# Patient Record
Sex: Female | Born: 1965
Health system: Southern US, Community
[De-identification: ages and names within clinical notes are randomized; demographics above are authoritative.]

## PROBLEM LIST (undated history)

## (undated) DIAGNOSIS — I10 Essential (primary) hypertension: Secondary | ICD-10-CM

## (undated) DIAGNOSIS — N189 Chronic kidney disease, unspecified: Secondary | ICD-10-CM

## (undated) DIAGNOSIS — F32A Depression, unspecified: Secondary | ICD-10-CM

## (undated) DIAGNOSIS — F329 Major depressive disorder, single episode, unspecified: Secondary | ICD-10-CM

## (undated) DIAGNOSIS — E119 Type 2 diabetes mellitus without complications: Secondary | ICD-10-CM

## (undated) HISTORY — DX: Depression, unspecified: F32.A

## (undated) HISTORY — DX: Essential (primary) hypertension: I10

## (undated) HISTORY — PX: ABDOMINAL HYSTERECTOMY: SHX81

## (undated) HISTORY — DX: Type 2 diabetes mellitus without complications: E11.9

## (undated) HISTORY — DX: Chronic kidney disease, unspecified: N18.9

## (undated) HISTORY — PX: COLONOSCOPY: SHX174

---
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
## 2013-06-13 LAB — HEPATIC FUNCTION PANEL
ALT: 19 U/L (ref 7–35)
AST: 19 U/L (ref 13–35)

## 2013-06-13 LAB — CMP14+LP+1AC+CBC/D/PLT+TSH: Calcium: 10.1 mg/dL

## 2013-06-13 LAB — BASIC METABOLIC PANEL
Creatinine: 0.9 mg/dL (ref 0.5–1.1)
Glucose: 99 mg/dL
POTASSIUM: 4 mmol/L (ref 3.4–5.3)
SODIUM: 140 mmol/L (ref 137–147)

## 2013-06-13 LAB — LIPID PANEL
Cholesterol: 154 mg/dL (ref 0–200)
TRIGLYCERIDES: 90 mg/dL (ref 40–160)

## 2013-06-13 LAB — HEMOGLOBIN A1C: Hgb A1c MFr Bld: 6.9 % — AB (ref 4.0–6.0)

## 2013-06-13 LAB — CBC AND DIFFERENTIAL
HEMATOCRIT: 42 % (ref 36–46)
HEMOGLOBIN: 14.3 g/dL (ref 12.0–16.0)
Platelets: 363 10*3/uL (ref 150–399)
WBC: 13.9 10^3/mL

## 2014-05-31 ENCOUNTER — Ambulatory Visit (INDEPENDENT_AMBULATORY_CARE_PROVIDER_SITE_OTHER): Payer: PRIVATE HEALTH INSURANCE | Admitting: Physician Assistant

## 2014-05-31 ENCOUNTER — Encounter: Payer: Self-pay | Admitting: Physician Assistant

## 2014-05-31 VITALS — BP 124/70 | HR 83 | Ht 66.75 in | Wt 205.0 lb

## 2014-05-31 DIAGNOSIS — Z1211 Encounter for screening for malignant neoplasm of colon: Secondary | ICD-10-CM

## 2014-05-31 DIAGNOSIS — E669 Obesity, unspecified: Secondary | ICD-10-CM | POA: Insufficient documentation

## 2014-05-31 DIAGNOSIS — I10 Essential (primary) hypertension: Secondary | ICD-10-CM

## 2014-05-31 DIAGNOSIS — F17200 Nicotine dependence, unspecified, uncomplicated: Secondary | ICD-10-CM | POA: Insufficient documentation

## 2014-05-31 DIAGNOSIS — E119 Type 2 diabetes mellitus without complications: Secondary | ICD-10-CM

## 2014-05-31 DIAGNOSIS — N951 Menopausal and female climacteric states: Secondary | ICD-10-CM

## 2014-05-31 DIAGNOSIS — R635 Abnormal weight gain: Secondary | ICD-10-CM | POA: Insufficient documentation

## 2014-05-31 MED ORDER — LISINOPRIL 40 MG PO TABS: 40.0000 mg | ORAL_TABLET | Freq: Every day | ORAL | Status: DC

## 2014-05-31 MED ORDER — AMBULATORY NON FORMULARY MEDICATION: Status: DC

## 2014-05-31 MED ORDER — PHENTERMINE HCL 37.5 MG PO TABS: 37.5000 mg | ORAL_TABLET | Freq: Every day | ORAL | Status: DC

## 2014-05-31 MED ORDER — BUPROPION HCL ER (SR) 150 MG PO TB12: 150.0000 mg | ORAL_TABLET | Freq: Two times a day (BID) | ORAL | Status: DC

## 2014-06-03 ENCOUNTER — Encounter: Payer: Self-pay | Admitting: Physician Assistant

## 2014-06-03 NOTE — Progress Notes (Signed)
Subjective:    Patient ID: Nina Trujillo, female    DOB: 1966-05-07, 49 y.o.   MRN: [id number]  HPI Patient is a 49 year old female who presents to the clinic to establish care today.  .. Active Ambulatory Problems    Diagnosis Date Noted  . Tobacco dependence 05/31/2014  . Abnormal weight gain 05/31/2014  . Obese 05/31/2014  . Essential hypertension, benign 05/31/2014  . Diabetes mellitus type 2, controlled, without complications 40/97/3532  . Menopausal symptoms 05/31/2014   Resolved Ambulatory Problems    Diagnosis Date Noted  . No Resolved Ambulatory Problems   Past Medical History  Diagnosis Date  . Hypertension   . Diabetes mellitus without complication    .[name] Family History  Problem Relation Age of Onset  . Cancer Mother     cervical, breast  . Multiple sclerosis Mother   . Multiple sclerosis Father   . Multiple sclerosis Sister   . Diabetes Sister   . Heart attack Brother   . Cancer Maternal Aunt     lung  . Diabetes Maternal Aunt   . Hypertension Maternal Aunt   . Stroke Maternal Grandmother   . Diabetes Maternal Aunt   . Hypertension Maternal Aunt    .[name] History   Social History  . Marital Status: Single    Spouse Name: N/A    Number of Children: N/A  . Years of Education: N/A   Occupational History  . Not on file.   Social History Main Topics  . Smoking status: Current Some Day Smoker  . Smokeless tobacco: Not on file  . Alcohol Use: No  . Drug Use: No  . Sexual Activity: Yes   Other Topics Concern  . Not on file   Social History Narrative   She would like refills on her medications today. She does have several ongoing conditions that she needs monitored.     Review of Systems  All other systems reviewed and are negative.      Objective:   Physical Exam  Constitutional: She is oriented to person, place, and time. She appears well-developed and well-nourished.  HENT:  Head: Normocephalic and atraumatic.  Cardiovascular:  Normal rate, regular rhythm and normal heart sounds.   Pulmonary/Chest: Effort normal and breath sounds normal. She has no wheezes.  Neurological: She is alert and oriented to person, place, and time.  Skin: Skin is dry.  Psychiatric: She has a normal mood and affect. Her behavior is normal.          Assessment & Plan:  Diabetes mellitus-do not have time to follow-up on diabetes and get A1c. Patient is to follow-up in 4 weeks. I did go ahead and refill metformin today.  Hypertension-blood pressure controlled today. Refilled HCTZ for 6 months.  Tobacco dependence-discuss quitting smoking today. She reports she is cutting back but not ready to completely quit at this point.  Obese-discussed importance of weight loss. The last 3 months she has lost around 20 pounds with walking and diet changes. She would like to continue this. Discussed medication to help. She is willing to discuss this at a later date.  Menopausal symptoms-she's been on estradiol since her hysterectomy in 98. Discussed possibility of going off this medication she is not ready at this time. Will discuss at future visit. Patient aware that goal therapy is to be on for 5 years and then coming off of it.  Will schedule patient for colonoscopy. Patient is aware she needs a complete physical at some point  to go over other health maintanence issues. Flu shot declined today. Tetanus shot up-to-date.

## 2014-06-27 ENCOUNTER — Other Ambulatory Visit: Payer: Self-pay | Admitting: *Deleted

## 2014-06-27 MED ORDER — LISINOPRIL 40 MG PO TABS: 40.0000 mg | ORAL_TABLET | Freq: Every day | ORAL | Status: DC

## 2014-06-28 ENCOUNTER — Ambulatory Visit: Payer: PRIVATE HEALTH INSURANCE | Admitting: Physician Assistant

## 2014-07-11 ENCOUNTER — Other Ambulatory Visit: Payer: Self-pay | Admitting: Physician Assistant

## 2014-07-19 ENCOUNTER — Other Ambulatory Visit: Payer: Self-pay | Admitting: Physician Assistant

## 2014-07-23 ENCOUNTER — Other Ambulatory Visit: Payer: Self-pay | Admitting: Physician Assistant

## 2014-07-30 ENCOUNTER — Encounter: Payer: Self-pay | Admitting: Physician Assistant

## 2014-08-01 ENCOUNTER — Other Ambulatory Visit: Payer: Self-pay | Admitting: Physician Assistant

## 2014-08-01 MED ORDER — METFORMIN HCL ER 750 MG PO TB24: 750.0000 mg | ORAL_TABLET | Freq: Every day | ORAL | Status: DC

## 2014-08-01 NOTE — Telephone Encounter (Signed)
Received refill request for Metformin 750mg  from [facility]. Per last note, Pt was to follow up in 4 weeks.  Contacted Pt, states she was having car trouble but has her car working now. Set up follow up appointment for 08/05/14 for further medication management and diabetes evaluation. Advised I would sent over one month supply of Metformin so she will not go without, but advised she needs to keep her upcoming appointment. Verbalized understanding. No further questions.

## 2014-08-05 ENCOUNTER — Encounter: Payer: Self-pay | Admitting: Physician Assistant

## 2014-08-05 ENCOUNTER — Ambulatory Visit (INDEPENDENT_AMBULATORY_CARE_PROVIDER_SITE_OTHER): Payer: PRIVATE HEALTH INSURANCE | Admitting: Physician Assistant

## 2014-08-05 VITALS — BP 136/83 | HR 96 | Wt 212.0 lb

## 2014-08-05 DIAGNOSIS — I10 Essential (primary) hypertension: Secondary | ICD-10-CM

## 2014-08-05 DIAGNOSIS — E119 Type 2 diabetes mellitus without complications: Secondary | ICD-10-CM | POA: Diagnosis not present

## 2014-08-05 DIAGNOSIS — E669 Obesity, unspecified: Secondary | ICD-10-CM

## 2014-08-05 DIAGNOSIS — M25512 Pain in left shoulder: Secondary | ICD-10-CM | POA: Diagnosis not present

## 2014-08-05 DIAGNOSIS — Z23 Encounter for immunization: Secondary | ICD-10-CM | POA: Diagnosis not present

## 2014-08-05 LAB — POCT GLYCOSYLATED HEMOGLOBIN (HGB A1C): HEMOGLOBIN A1C: 6

## 2014-08-05 LAB — POCT UA - MICROALBUMIN
Albumin/Creatinine Ratio, Urine, POC: <30
Creatinine, POC: 300 mg/dL
Microalbumin Ur, POC: 10 mg/L

## 2014-08-05 MED ORDER — MELOXICAM 15 MG PO TABS: 15.0000 mg | ORAL_TABLET | Freq: Every day | ORAL | Status: DC

## 2014-08-05 MED ORDER — BUPROPION HCL ER (SR) 150 MG PO TB12: 150.0000 mg | ORAL_TABLET | Freq: Two times a day (BID) | ORAL | Status: DC

## 2014-08-05 MED ORDER — HYDROCHLOROTHIAZIDE 25 MG PO TABS: 25.0000 mg | ORAL_TABLET | Freq: Every day | ORAL | Status: DC

## 2014-08-05 MED ORDER — METFORMIN HCL ER 750 MG PO TB24: 750.0000 mg | ORAL_TABLET | Freq: Every day | ORAL | Status: DC

## 2014-08-05 MED ORDER — PHENTERMINE HCL 37.5 MG PO TABS: 37.5000 mg | ORAL_TABLET | Freq: Every day | ORAL | Status: DC

## 2014-08-05 NOTE — Progress Notes (Signed)
   Subjective:    Patient ID: Nina Trujillo, female    DOB: 1965/07/12, 49 y.o.   MRN: [id number]  HPI Pt to the clinic for DM follow up. Not checking sugars but taking metformin daily. No hypoglycemic events. No concerns. Trying to maintain a good diet. Is going to start exercise.   Obesity- wants to restart phentermine. Started but did not give a good go due to life circumstances. Ready to restart.   HtN- no problems or concerns. No CP, palpitations, headaches, vision changes.   Pt mentions left shoulder pain at end of visit. Some days better than others. No known trauma or injury. Tried occasional ibuprofen as needed and helps some.    Review of Systems  All other systems reviewed and are negative.      Objective:   Physical Exam  Constitutional: She is oriented to person, place, and time. She appears well-developed and well-nourished.  HENT:  Head: Normocephalic and atraumatic.  Cardiovascular: Normal rate, regular rhythm and normal heart sounds.   Pulmonary/Chest: Effort normal and breath sounds normal.  Neurological: She is alert and oriented to person, place, and time.  Skin: Skin is dry.  Psychiatric: She has a normal mood and affect. Her behavior is normal.          Assessment & Plan:  DM, type II, controlled- .[name] Lab Results  Component Value Date   HGBA1C 6.0 08/05/2014   Microalbumin normal.  Continue metformin daily.  Foot exam normal.  Pneumococcal 23 done today.  Discussed need for eye exam.   Obesity- pt would like to start phentermine again. She tried back in january  did not give it a good go. Discussed could be coming less effective. Discussed other weight loss options. At this time would like to try phentermine again. Restarted. SE discussed. Follow up in 1 month.   HTN- refilled lisinopril and HCTZ. Borderline BP for diabetic. Will continue to monitor.   Left shoulder pain- pt aware no time to discuss today needs to make another appt. mobic given  to try for the next 2 weeks. Can evaluate if needs.

## 2014-08-24 ENCOUNTER — Other Ambulatory Visit: Payer: Self-pay | Admitting: Physician Assistant

## 2014-09-05 ENCOUNTER — Ambulatory Visit: Payer: PRIVATE HEALTH INSURANCE

## 2014-09-09 ENCOUNTER — Other Ambulatory Visit: Payer: Self-pay | Admitting: Physician Assistant

## 2014-12-02 ENCOUNTER — Ambulatory Visit: Payer: Self-pay | Admitting: Family Medicine

## 2014-12-13 ENCOUNTER — Ambulatory Visit (INDEPENDENT_AMBULATORY_CARE_PROVIDER_SITE_OTHER): Payer: PRIVATE HEALTH INSURANCE | Admitting: Family Medicine

## 2014-12-13 ENCOUNTER — Encounter: Payer: Self-pay | Admitting: Family Medicine

## 2014-12-13 ENCOUNTER — Ambulatory Visit: Payer: Self-pay | Admitting: Family Medicine

## 2014-12-13 VITALS — BP 138/82 | HR 96 | Ht 66.75 in | Wt 204.0 lb

## 2014-12-13 DIAGNOSIS — E669 Obesity, unspecified: Secondary | ICD-10-CM | POA: Diagnosis not present

## 2014-12-13 DIAGNOSIS — L732 Hidradenitis suppurativa: Secondary | ICD-10-CM | POA: Diagnosis not present

## 2014-12-13 DIAGNOSIS — R635 Abnormal weight gain: Secondary | ICD-10-CM | POA: Diagnosis not present

## 2014-12-13 DIAGNOSIS — E119 Type 2 diabetes mellitus without complications: Secondary | ICD-10-CM

## 2014-12-13 DIAGNOSIS — N183 Chronic kidney disease, stage 3 unspecified: Secondary | ICD-10-CM

## 2014-12-13 LAB — COMPLETE METABOLIC PANEL WITH GFR
ALK PHOS: 68 U/L (ref 33–130)
ALT: 13 U/L (ref 6–29)
AST: 18 U/L (ref 10–35)
Albumin: 4.1 g/dL (ref 3.6–5.1)
BUN: 12 mg/dL (ref 7–25)
CHLORIDE: 107 mmol/L (ref 98–110)
CO2: 26 mmol/L (ref 20–31)
CREATININE: 1.2 mg/dL — AB (ref 0.50–1.05)
Calcium: 9.7 mg/dL (ref 8.6–10.4)
GFR, Est African American: 61 mL/min (ref >=60)
GFR, Est Non African American: 53 mL/min — ABNORMAL LOW (ref >=60)
GLUCOSE: 103 mg/dL — AB (ref 65–99)
Potassium: 4 mmol/L (ref 3.5–5.3)
Sodium: 140 mmol/L (ref 135–146)
Total Bilirubin: 0.3 mg/dL (ref 0.2–1.2)
Total Protein: 7 g/dL (ref 6.1–8.1)

## 2014-12-13 LAB — POCT GLYCOSYLATED HEMOGLOBIN (HGB A1C): HEMOGLOBIN A1C: 5.6

## 2014-12-13 MED ORDER — PHENTERMINE HCL 37.5 MG PO TABS: 37.5000 mg | ORAL_TABLET | Freq: Every day | ORAL | Status: DC

## 2014-12-13 MED ORDER — DOXYCYCLINE HYCLATE 100 MG PO TABS: 100.0000 mg | ORAL_TABLET | Freq: Two times a day (BID) | ORAL | Status: DC

## 2014-12-13 NOTE — Assessment & Plan Note (Signed)
Well controlled diabetes. Continue metformin. Check metabolic panel to assess renal function

## 2014-12-13 NOTE — Assessment & Plan Note (Addendum)
With early abscess formation not yet drainable. Treat with doxycycline

## 2014-12-13 NOTE — Progress Notes (Signed)
Nina Trujillo is a 49 y.o. female who presents to [facility]  today for diabetes and hidradenitis check.  1) patient has well-controlled diabetes. She takes metformin daily. She denies any polyuria or polydipsia or hypoglycemic episodes. She feels well.  2) hidradenitis. Patient has frequent abscesses in both axilla and inguinal creases. She notes right axilla tenderness recently. In the past this is been well managed with oral antibiotics. She denies any drainage. No fevers or chills nausea vomiting or diarrhea.  3) stress: Patient recently lost her job. She is worried about what she will do.   Past Medical History  Diagnosis Date  . Hypertension   . Diabetes mellitus without complication    Past Surgical History  Procedure Laterality Date  . Abdominal hysterectomy      both uterus and ovaries removed.    History  Substance Use Topics  . Smoking status: Current Some Day Smoker  . Smokeless tobacco: Not on file  . Alcohol Use: No   ROS as above Medications: Current Outpatient Prescriptions  Medication Sig Dispense Refill  . AMBULATORY NON FORMULARY MEDICATION Glucometer device and lancets and test strips.  Dx. Diabetes 250.0 controlled   Test 1 to 3 times a day. 1 Device 0  . buPROPion (WELLBUTRIN SR) 150 MG 12 hr tablet Take 1 tablet (150 mg total) by mouth 2 (two) times daily. 60 tablet 5  . estradiol (ESTRACE) 2 MG tablet Take 2 mg by mouth daily.    . hydrochlorothiazide (HYDRODIURIL) 25 MG tablet Take 1 tablet (25 mg total) by mouth daily. 30 tablet 5  . lisinopril (PRINIVIL,ZESTRIL) 40 MG tablet Take 1 tablet (40 mg total) by mouth daily. 30 tablet 4  . meloxicam (MOBIC) 15 MG tablet TAKE ONE TABLET BY MOUTH ONCE DAILY FOR  2  WEEKS  THEN  AS  NEEDED  FOR  LEFT  SHOULDER  PAIN 30 tablet 2  . metFORMIN (GLUCOPHAGE-XR) 750 MG 24 hr tablet Take 1 tablet (750 mg total) by mouth daily with breakfast. 30 tablet 5  . doxycycline  (VIBRA-TABS) 100 MG tablet Take 1 tablet (100 mg total) by mouth 2 (two) times daily. 20 tablet 0  . phentermine (ADIPEX-P) 37.5 MG tablet Take 1 tablet (37.5 mg total) by mouth daily before breakfast. 30 tablet 0   No current facility-administered medications for this visit.   Allergies  Allergen Reactions  . Penicillins Anaphylaxis     Exam:  BP 138/82 mmHg  Pulse 96  Ht 5' 6.75" (1.695 m)  Wt 204 lb (92.534 kg)  BMI 32.21 kg/m2 Gen: Well NAD HEENT: EOMI,  MMM Lungs: Normal work of breathing. CTABL Heart: RRR no MRG Abd: NABS, Soft. Nondistended, Nontender Exts: Brisk capillary refill, warm and well perfused.  Skin: Mildly erythematous tender nodules bilateral axilla. No fluctuance or induration present.  Results for orders placed or performed in visit on 12/13/14 (from the past 24 hour(s))  POCT HgB A1C     Status: None   Collection Time: 12/13/14 11:29 AM  Result Value Ref Range   Hemoglobin A1C 5.6    No results found.   Please see individual assessment and plan sections.

## 2014-12-13 NOTE — Assessment & Plan Note (Signed)
Doing well refill phentermine

## 2014-12-13 NOTE — Patient Instructions (Signed)
Thank you for coming in today. Get labs today.,   Hidradenitis Suppurativa, Sweat Gland Abscess Hidradenitis suppurativa is a long lasting (chronic), uncommon disease of the sweat glands. With this, boil-like lumps and scarring develop in the groin, some times under the arms (axillae), and under the breasts. It may also uncommonly occur behind the ears, in the crease of the buttocks, and around the genitals.  CAUSES  The cause is from a blocking of the sweat glands. They then become infected. It may cause drainage and odor. It is not contagious. So it cannot be given to someone else. It most often shows up in puberty (about 36 to 51 years of age). But it may happen much later. It is similar to acne which is a disease of the sweat glands. This condition is slightly more common in African-Americans and women. SYMPTOMS   Hidradenitis usually starts as one or more red, tender, swellings in the groin or under the arms (axilla).  Over a period of hours to days the lesions get larger. They often open to the skin surface, draining clear to yellow-colored fluid.  The infected area heals with scarring. DIAGNOSIS  Your caregiver makes this diagnosis by looking at you. Sometimes cultures (growing germs on plates in the lab) may be taken. This is to see what germ (bacterium) is causing the infection.  TREATMENT   Topical germ killing medicine applied to the skin (antibiotics) are the treatment of choice. Antibiotics taken by mouth (systemic) are sometimes needed when the condition is getting worse or is severe.  Avoid tight-fitting clothing which traps moisture in.  Dirt does not cause hidradenitis and it is not caused by poor hygiene.  Involved areas should be cleaned daily using an antibacterial soap. Some patients find that the liquid form of Lever 2000, applied to the involved areas as a lotion after bathing, can help reduce the odor related to this condition.  Sometimes surgery is needed to drain  infected areas or remove scarred tissue. Removal of large amounts of tissue is used only in severe cases.  Birth control pills may be helpful.  Oral retinoids (vitamin A derivatives) for 6 to 12 months which are effective for acne may also help this condition.  Weight loss will improve but not cure hidradenitis. It is made worse by being overweight. But the condition is not caused by being overweight.  This condition is more common in people who have had acne.  It may become worse under stress. There is no medical cure for hidradenitis. It can be controlled, but not cured. The condition usually continues for years with periods of getting worse and getting better (remission). Document Released: 12/16/2003 Document Revised: 07/26/2011 Document Reviewed: 08/03/2013 [facility] Patient Information 2015 [place], [place]. This information is not intended to replace advice given to you by your health care provider. Make sure you discuss any questions you have with your health care provider.

## 2014-12-16 DIAGNOSIS — N183 Chronic kidney disease, stage 3 unspecified: Secondary | ICD-10-CM | POA: Insufficient documentation

## 2014-12-16 NOTE — Progress Notes (Signed)
Quick Note:  Labs show elevated creatine indicating mild kidney damage. No action needed at this time. Recheck in 6 months. ______

## 2015-01-01 ENCOUNTER — Other Ambulatory Visit: Payer: Self-pay | Admitting: Family Medicine

## 2015-01-11 ENCOUNTER — Other Ambulatory Visit: Payer: Self-pay | Admitting: Physician Assistant

## 2015-01-11 ENCOUNTER — Other Ambulatory Visit: Payer: Self-pay | Admitting: Family Medicine

## 2015-02-05 ENCOUNTER — Ambulatory Visit: Payer: PRIVATE HEALTH INSURANCE | Admitting: Physician Assistant

## 2015-02-07 ENCOUNTER — Other Ambulatory Visit: Payer: Self-pay | Admitting: Physician Assistant

## 2015-02-07 MED ORDER — ESTRADIOL 2 MG PO TABS
2.0000 mg | ORAL_TABLET | Freq: Every day | ORAL | Status: DC
Start: 2015-02-07 — End: 2015-03-17

## 2015-02-18 ENCOUNTER — Other Ambulatory Visit: Payer: Self-pay | Admitting: Physician Assistant

## 2015-02-22 ENCOUNTER — Other Ambulatory Visit: Payer: Self-pay | Admitting: Physician Assistant

## 2015-02-25 ENCOUNTER — Other Ambulatory Visit: Payer: Self-pay

## 2015-02-25 MED ORDER — BUPROPION HCL ER (SR) 150 MG PO TB12: 150.0000 mg | ORAL_TABLET | Freq: Two times a day (BID) | ORAL | Status: DC

## 2015-03-10 ENCOUNTER — Other Ambulatory Visit: Payer: Self-pay | Admitting: Physician Assistant

## 2015-03-13 ENCOUNTER — Other Ambulatory Visit: Payer: Self-pay

## 2015-03-17 ENCOUNTER — Other Ambulatory Visit: Payer: Self-pay | Admitting: Physician Assistant

## 2015-03-19 ENCOUNTER — Ambulatory Visit (INDEPENDENT_AMBULATORY_CARE_PROVIDER_SITE_OTHER): Payer: PRIVATE HEALTH INSURANCE | Admitting: Physician Assistant

## 2015-03-19 ENCOUNTER — Encounter: Payer: Self-pay | Admitting: Physician Assistant

## 2015-03-19 ENCOUNTER — Ambulatory Visit: Payer: PRIVATE HEALTH INSURANCE | Admitting: Physician Assistant

## 2015-03-19 VITALS — BP 130/53 | HR 56 | Ht 66.75 in | Wt 207.0 lb

## 2015-03-19 DIAGNOSIS — L732 Hidradenitis suppurativa: Secondary | ICD-10-CM | POA: Diagnosis not present

## 2015-03-19 DIAGNOSIS — N183 Chronic kidney disease, stage 3 unspecified: Secondary | ICD-10-CM

## 2015-03-19 DIAGNOSIS — I10 Essential (primary) hypertension: Secondary | ICD-10-CM

## 2015-03-19 DIAGNOSIS — F17200 Nicotine dependence, unspecified, uncomplicated: Secondary | ICD-10-CM

## 2015-03-19 DIAGNOSIS — F329 Major depressive disorder, single episode, unspecified: Secondary | ICD-10-CM

## 2015-03-19 DIAGNOSIS — E669 Obesity, unspecified: Secondary | ICD-10-CM

## 2015-03-19 DIAGNOSIS — Z13220 Encounter for screening for lipoid disorders: Secondary | ICD-10-CM

## 2015-03-19 DIAGNOSIS — E119 Type 2 diabetes mellitus without complications: Secondary | ICD-10-CM | POA: Diagnosis not present

## 2015-03-19 DIAGNOSIS — F32A Depression, unspecified: Secondary | ICD-10-CM

## 2015-03-19 DIAGNOSIS — Z23 Encounter for immunization: Secondary | ICD-10-CM | POA: Diagnosis not present

## 2015-03-19 LAB — POCT GLYCOSYLATED HEMOGLOBIN (HGB A1C): HEMOGLOBIN A1C: 6

## 2015-03-19 MED ORDER — ESTRADIOL 2 MG PO TABS: 2.0000 mg | ORAL_TABLET | Freq: Every day | ORAL | Status: DC

## 2015-03-19 MED ORDER — MELOXICAM 15 MG PO TABS: 15.0000 mg | ORAL_TABLET | Freq: Every day | ORAL | Status: DC

## 2015-03-19 MED ORDER — DOXYCYCLINE HYCLATE 100 MG PO TABS: 100.0000 mg | ORAL_TABLET | Freq: Two times a day (BID) | ORAL | Status: DC

## 2015-03-19 MED ORDER — LISINOPRIL 40 MG PO TABS: 40.0000 mg | ORAL_TABLET | Freq: Every day | ORAL | Status: DC

## 2015-03-19 MED ORDER — BENZOYL PEROXIDE 10 % EX GEL: Freq: Every day | CUTANEOUS | Status: DC

## 2015-03-19 MED ORDER — PHENTERMINE HCL 37.5 MG PO TABS
37.5000 mg | ORAL_TABLET | Freq: Every day | ORAL | Status: DC
Start: 2015-03-19 — End: 2015-08-25

## 2015-03-19 MED ORDER — METFORMIN HCL ER 750 MG PO TB24: 750.0000 mg | ORAL_TABLET | Freq: Every day | ORAL | Status: DC

## 2015-03-19 MED ORDER — BUPROPION HCL ER (SR) 150 MG PO TB12: 150.0000 mg | ORAL_TABLET | Freq: Two times a day (BID) | ORAL | Status: DC

## 2015-03-19 MED ORDER — HYDROCHLOROTHIAZIDE 25 MG PO TABS: 25.0000 mg | ORAL_TABLET | Freq: Every day | ORAL | Status: DC

## 2015-03-19 NOTE — Patient Instructions (Signed)

## 2015-03-21 DIAGNOSIS — F32A Depression, unspecified: Secondary | ICD-10-CM | POA: Insufficient documentation

## 2015-03-21 DIAGNOSIS — Z6834 Body mass index (BMI) 34.0-34.9, adult: Secondary | ICD-10-CM

## 2015-03-21 DIAGNOSIS — E6609 Other obesity due to excess calories: Secondary | ICD-10-CM | POA: Insufficient documentation

## 2015-03-21 DIAGNOSIS — F329 Major depressive disorder, single episode, unspecified: Secondary | ICD-10-CM | POA: Insufficient documentation

## 2015-03-21 NOTE — Progress Notes (Signed)
   Subjective:    Patient ID: Nina Trujillo, female    DOB: [birth year], 49 y.o.   MRN: [id number]  HPI Patient is a 49 year old female who presents to the clinic for 3 month follow-up.  Diabetes-patient takes metformin 6 are 750 mg daily. She denies any hypoglycemic events. She does not check her sugar regularly. She denies any open wounds or unhealed infections. She denies any vision changes or numbness and tingling of extremities. She admits she is not eating the way she should and she is not exercising. Patient does continue to smoke.  Hypertension-patient denies any chest pains, palpitations, shortness of breath, headaches or dizziness. She continues to take lisinopril and hydrochlorothiazide.  Patient continues to have tender THAT pop up in her armpits and bilateral groin area. At last visit she was given an antibiotic and it did help for a little bit. They've been "resurface. In the past she has seen dermatology but lost her insurance for a period of time.  She is really struggling with her weight. She was started on phentermine a few months back but never followed up. She would like to restart today.  Depression-she's on Wellbutrin daily. She denies any suicidal or homicidal thoughts. She feels like she is improving somewhat over the last 3 months since she got a job. It was a tough couple months when she lost her job with feeling down and hopeless and helpless.   Review of Systems  All other systems reviewed and are negative.      Objective:   Physical Exam  Constitutional: She is oriented to person, place, and time. She appears well-developed and well-nourished.  Obese.   HENT:  Head: Normocephalic and atraumatic.  Cardiovascular: Normal rate, regular rhythm and normal heart sounds.   Pulmonary/Chest: Effort normal and breath sounds normal. She has no wheezes.  Neurological: She is alert and oriented to person, place, and time.  Skin: Skin is dry.     Psychiatric: She has a  normal mood and affect. Her behavior is normal.          Assessment & Plan:  DM, type II, controlled- A1C 6.0 slight increase in last 3 months.  Continue on metformin.  Encouraged weight loss.  Flu shot given.  On ACE.  Encouraged eye exam.   HTN- controlled. Continue lisinopril and HcTZ.   Obesity- refilled phentermine. Discussed diet and exercise changes. Side effects discussed. Follow up in 1 month.   Screening lipid ordered.   CKD, stage III- recheck CMP today.   Hidradenitis supprativa- refilled doxy for 10 days. Given benzyol peroxide to use daily. Discussed twice weekly bleach bath soak. I would recommend going to derm for more intervention. She request to hold off at this time due to money.   Depression- refiled wellbutrin.   Tobacco dependence- encouraged smoking cessation today.

## 2015-03-31 ENCOUNTER — Ambulatory Visit: Payer: PRIVATE HEALTH INSURANCE

## 2015-06-25 ENCOUNTER — Other Ambulatory Visit: Payer: Self-pay | Admitting: Physician Assistant

## 2015-06-30 ENCOUNTER — Ambulatory Visit: Payer: PRIVATE HEALTH INSURANCE | Admitting: Physician Assistant

## 2015-07-14 ENCOUNTER — Other Ambulatory Visit: Payer: Self-pay | Admitting: Physician Assistant

## 2015-07-15 ENCOUNTER — Telehealth: Payer: Self-pay | Admitting: Physician Assistant

## 2015-07-15 NOTE — Telephone Encounter (Signed)
I called patient and left a message for her to call back and schedule a follow up with [name] so she can f/u on her Diabetes .[name] Thanks

## 2015-08-14 ENCOUNTER — Other Ambulatory Visit: Payer: Self-pay | Admitting: Physician Assistant

## 2015-08-25 ENCOUNTER — Ambulatory Visit: Payer: PRIVATE HEALTH INSURANCE | Admitting: Physician Assistant

## 2015-08-25 ENCOUNTER — Encounter: Payer: Self-pay | Admitting: Physician Assistant

## 2015-08-25 ENCOUNTER — Ambulatory Visit (INDEPENDENT_AMBULATORY_CARE_PROVIDER_SITE_OTHER): Payer: PRIVATE HEALTH INSURANCE | Admitting: Physician Assistant

## 2015-08-25 VITALS — BP 122/60 | HR 81 | Ht 66.75 in | Wt 203.0 lb

## 2015-08-25 DIAGNOSIS — E1129 Type 2 diabetes mellitus with other diabetic kidney complication: Secondary | ICD-10-CM | POA: Insufficient documentation

## 2015-08-25 DIAGNOSIS — Z131 Encounter for screening for diabetes mellitus: Secondary | ICD-10-CM

## 2015-08-25 DIAGNOSIS — N183 Chronic kidney disease, stage 3 unspecified: Secondary | ICD-10-CM

## 2015-08-25 DIAGNOSIS — L732 Hidradenitis suppurativa: Secondary | ICD-10-CM | POA: Diagnosis not present

## 2015-08-25 DIAGNOSIS — Z13220 Encounter for screening for lipoid disorders: Secondary | ICD-10-CM | POA: Diagnosis not present

## 2015-08-25 DIAGNOSIS — Z1159 Encounter for screening for other viral diseases: Secondary | ICD-10-CM

## 2015-08-25 DIAGNOSIS — R809 Proteinuria, unspecified: Secondary | ICD-10-CM

## 2015-08-25 DIAGNOSIS — E119 Type 2 diabetes mellitus without complications: Secondary | ICD-10-CM | POA: Diagnosis not present

## 2015-08-25 LAB — POCT UA - MICROALBUMIN
CREATININE, POC: 50 mg/dL
MICROALBUMIN (UR) POC: 30 mg/L

## 2015-08-25 LAB — POCT GLYCOSYLATED HEMOGLOBIN (HGB A1C): HEMOGLOBIN A1C: 5.7

## 2015-08-25 MED ORDER — DOXYCYCLINE HYCLATE 100 MG PO TABS: 100.0000 mg | ORAL_TABLET | Freq: Two times a day (BID) | ORAL | Status: DC

## 2015-08-25 MED ORDER — METFORMIN HCL ER 750 MG PO TB24: ORAL_TABLET | ORAL | Status: DC

## 2015-08-25 MED ORDER — ESTRADIOL 2 MG PO TABS: 2.0000 mg | ORAL_TABLET | Freq: Every day | ORAL | Status: DC

## 2015-08-25 MED ORDER — PHENTERMINE HCL 37.5 MG PO TABS: 37.5000 mg | ORAL_TABLET | Freq: Every day | ORAL | Status: DC

## 2015-08-25 MED ORDER — LISINOPRIL 40 MG PO TABS: 40.0000 mg | ORAL_TABLET | Freq: Every day | ORAL | Status: DC

## 2015-08-25 MED ORDER — BUPROPION HCL ER (SR) 150 MG PO TB12: 150.0000 mg | ORAL_TABLET | Freq: Two times a day (BID) | ORAL | Status: DC

## 2015-08-25 MED ORDER — HYDROCHLOROTHIAZIDE 25 MG PO TABS: 25.0000 mg | ORAL_TABLET | Freq: Every day | ORAL | Status: DC

## 2015-08-25 NOTE — Patient Instructions (Addendum)
Corns and Calluses Corns are small areas of thickened skin that occur on the top, sides, or tip of a toe. They contain a cone-shaped core with a point that can press on a nerve below. This causes pain. Calluses are areas of thickened skin that can occur anywhere on the body including hands, fingers, palms, soles of the feet, and heels.Calluses are usually larger than corns.  CAUSES  Corns and calluses are caused by rubbing (friction) or pressure, such as from shoes that are too tight or do not fit properly.  RISK FACTORS Corns are more likely to develop in people who have toe deformities, such as hammer toes. Since calluses can occur with friction to any area of the skin, calluses are more likely to develop in people who:   Work with their hands.  Wear shoes that fit poorly, shoes that are too tight, or shoes that are high-heeled.  Have toes deformities. SYMPTOMS Symptoms of a corn or callus include:  A hard growth on the skin.   Pain or tenderness under the skin.   Redness and swelling.   Increased discomfort while wearing tight-fitting shoes. DIAGNOSIS  Corns and calluses may be diagnosed with a medical history and physical exam.  TREATMENT  Corns and calluses may be treated with:  Removing the cause of the friction or pressure. This may include:  Changing your shoes.  Wearing shoe inserts (orthotics) or other protective layers in your shoes, such as a corn pad.  Wearing gloves.  Medicines to help soften skin in the hardened, thickened areas.  Reducing the size of the corn or callus by removing the dead layers of skin.  Antibiotic medicines to treat infection.  Surgery, if a toe deformity is the cause. HOME CARE INSTRUCTIONS   Take medicines only as directed by your health care provider.  If you were prescribed an antibiotic, finish all of it even if you start to feel better.  Wear shoes that fit well. Avoid wearing high-heeled shoes and shoes that are too tight  or too loose.  Wear any padding, protective layers, gloves, or orthotics as directed by your health care provider.  Soak your hands or feet and then use a file or pumice stone to soften your corn or callus. Do this as directed by your health care provider.  Check your corn or callus every day for signs of infection. Watch for:  Redness, swelling, or pain.  Fluid, blood, or pus. SEEK MEDICAL CARE IF:   Your symptoms do not improve with treatment.  You have increased redness, swelling, or pain at the site of your corn or callus.  You have fluid, blood, or pus coming from your corn or callus.  You have new symptoms.   This information is not intended to replace advice given to you by your health care provider. Make sure you discuss any questions you have with your health care provider.   Document Released: 02/07/2004 Document Revised: 09/17/2014 Document Reviewed: 04/29/2014 Elsevier Interactive Patient Education 2016 [employer]  Plantar Warts Warts are small growths on the skin. They can occur on various areas of the body. When they occur on the underside (sole) of the foot, they are called plantar warts. Plantar warts often occur in groups, with several small warts around a larger growth. They tend to develop over areas of pressure, such as the heel or the ball of the foot. Most warts are not painful, and they usually do not cause problems. However, plantar warts may cause pain  when you walk because pressure is applied to them. Warts often go away on their own in time. Various treatments may be done if needed. Sometimes, warts go away and then they come back again. CAUSES Plantar warts are caused by a type of virus that is called human papillomavirus (HPV). HPV attacks a break in the skin of the foot. Walking barefoot can lead to exposure to the virus. These warts may spread to other areas of the sole. They spread to other areas of the body only through direct contact. RISK  FACTORS Plantar warts are more likely to develop in:  People who are 51-90 years of age.  People who use public showers or locker rooms.  People who have a weakened body defense system (immune system). SYMPTOMS Plantar warts may be flat or slightly raised. They may grow into the deeper layers of skin or rise above the surface of the skin. Most plantar warts have a rough surface. They may cause pain when you use your foot to support your body weight. DIAGNOSIS A plantar wart can usually be diagnosed from its appearance. In some cases, a tissue sample may be removed (biopsy) to be looked at under a microscope. TREATMENT In many cases, warts do not need treatment. Without treatment, they often go away over a period of many months to a couple years. If treatment is needed, options may include:  Applying medicated solutions, creams, or patches to the wart. These may be over-the-counter or prescription medicines that make the skin soft so that layers will gradually shed away. In many cases, the medicine is applied one or two times per day and covered with a bandage.  Putting duct tape over the top of the wart (occlusion). You will leave the tape in place for as long as told by your health care provider, then you will replace it with a new strip of tape. This is done until the wart goes away.  Freezing the wart with liquid nitrogen (cryotherapy).  Burning the wart with:  Laser treatment.  An electrified probe (electrocautery).  Injection of a medicine (Candida antigen) into the wart to help the body's immune system to fight off the wart.  Surgery to remove the wart. HOME CARE INSTRUCTIONS  Apply medicated creams or solutions only as told by your health care provider. This may involve:  Soaking the affected area in warm water.  Removing the top layer of softened skin before you apply the medicine. A pumice stone works well for removing the tissue.  Applying a bandage over the affected  area after you apply the medicine.  Repeating the process daily or as told by your health care provider.  Do not scratch or pick at a wart.  Wash your hands after you touch a wart.  If a wart is painful, try applying a bandage with a hole in the middle over the wart. The helps to take pressure off the wart.  Keep all follow-up visits as told by your health care provider. This is important. PREVENTION Take these actions to help prevent warts:  Wear shoes and socks. Change your socks daily.  Keep your feet clean and dry.  Check your feet regularly.  Avoid direct contact with warts on other people. SEEK MEDICAL CARE IF:  Your warts do not improve after treatment.  You have redness, swelling, or pain at the site of a wart.  You have bleeding from a wart that does not stop with light pressure.  You have diabetes and you  develop a wart.   This information is not intended to replace advice given to you by your health care provider. Make sure you discuss any questions you have with your health care provider.   Document Released: 07/24/2003 Document Revised: 01/22/2015 Document Reviewed: 07/29/2014 Elsevier Interactive Patient Education [employer].

## 2015-08-25 NOTE — Progress Notes (Signed)
   Subjective:    Patient ID: Nina Trujillo, female    DOB: Jul 16, 1965, 50 y.o.   MRN: [id number]  HPI  Pt is a 50 yo female who presents to the clinic to follow up on DM. She is not checking her sugars. She denies any hypoglycemia. She does not have any wounds or open sores. She is taking metformin daily. She is having some right foot pain on the bottom of foot surrounding a lesion. She is also having some discomfort when the side of her great right toe rubs her shoes. She wonders what to do.    She is also having more knots and tenderness of axilla of both arms. She has hx of infections in arm pits. abx do help but they come back. Worse in warmer months. She has also tried benzyol peroxide and bleach bath soaks.     Review of Systems  All other systems reviewed and are negative.      Objective:   Physical Exam  Constitutional: She is oriented to person, place, and time. She appears well-developed and well-nourished.  HENT:  Head: Normocephalic and atraumatic.  Cardiovascular: Normal rate, regular rhythm and normal heart sounds.   Pulmonary/Chest: Effort normal and breath sounds normal.  Musculoskeletal:  Right great toe callus.   Right plantar wart of ball of foot appearance is flat and wart like with some tenderness to palpation.   Neurological: She is alert and oriented to person, place, and time.  Skin: Skin is dry.  Bilateral axilla cyst and boils present with past hx of of boils with scarring.   Psychiatric: She has a normal mood and affect. Her behavior is normal.          Assessment & Plan:  DM type II- .[name] Lab Results  Component Value Date   HGBA1C 5.7 08/25/2015   A!C very controlled looks great.  Metformin refilled.  microalbumin abnormal- on ACE. I do not think there is any neuropathy but rather pain from plantar wart.  Reminded need for eye exam.   CKD- recheck CMP.   Callus- discussed warm water soaks and pummle stone with debridement. Declined  salicylic acid. Wear shoes that are not too tight. Do not let rub on shoe. consider podiatry follow up.   Plantar wart- discussed cryotherapy. Pt declined. Discussed salicyclic acid. Pt decined. Discussed wart ring for rest. Podiatry referral if needed.    Hidradenitis supprativa- will make derm referral tried and failed conservative therapy of benzyol peroxide and hibiclens and bleach water. abx clear it up but it comes back. Doxycycline given today.

## 2015-08-26 ENCOUNTER — Encounter: Payer: Self-pay | Admitting: Physician Assistant

## 2015-09-01 ENCOUNTER — Other Ambulatory Visit: Payer: Self-pay | Admitting: Physician Assistant

## 2015-09-02 ENCOUNTER — Other Ambulatory Visit: Payer: Self-pay | Admitting: *Deleted

## 2015-09-02 MED ORDER — METFORMIN HCL ER 750 MG PO TB24: ORAL_TABLET | ORAL | Status: DC

## 2015-10-09 ENCOUNTER — Other Ambulatory Visit: Payer: Self-pay | Admitting: Physician Assistant

## 2015-10-18 ENCOUNTER — Other Ambulatory Visit: Payer: Self-pay | Admitting: Physician Assistant

## 2015-11-07 ENCOUNTER — Other Ambulatory Visit: Payer: Self-pay | Admitting: Physician Assistant

## 2015-11-24 ENCOUNTER — Ambulatory Visit: Payer: PRIVATE HEALTH INSURANCE | Admitting: Physician Assistant

## 2015-11-27 ENCOUNTER — Other Ambulatory Visit: Payer: Self-pay | Admitting: Physician Assistant

## 2016-04-04 ENCOUNTER — Other Ambulatory Visit: Payer: Self-pay | Admitting: Physician Assistant

## 2016-04-14 ENCOUNTER — Other Ambulatory Visit: Payer: Self-pay | Admitting: Physician Assistant

## 2016-05-24 ENCOUNTER — Other Ambulatory Visit: Payer: Self-pay | Admitting: Physician Assistant

## 2016-06-01 ENCOUNTER — Other Ambulatory Visit: Payer: Self-pay | Admitting: Physician Assistant

## 2016-06-04 ENCOUNTER — Other Ambulatory Visit: Payer: Self-pay | Admitting: Physician Assistant

## 2016-07-02 ENCOUNTER — Other Ambulatory Visit: Payer: Self-pay | Admitting: Physician Assistant

## 2016-07-08 ENCOUNTER — Other Ambulatory Visit: Payer: Self-pay | Admitting: Physician Assistant

## 2016-07-16 ENCOUNTER — Encounter: Payer: Self-pay | Admitting: Physician Assistant

## 2016-07-16 ENCOUNTER — Ambulatory Visit (INDEPENDENT_AMBULATORY_CARE_PROVIDER_SITE_OTHER): Payer: PRIVATE HEALTH INSURANCE | Admitting: Physician Assistant

## 2016-07-16 VITALS — BP 97/67 | HR 106 | Temp 98.1°F | Ht 66.75 in | Wt 210.0 lb

## 2016-07-16 DIAGNOSIS — R109 Unspecified abdominal pain: Secondary | ICD-10-CM

## 2016-07-16 DIAGNOSIS — R350 Frequency of micturition: Secondary | ICD-10-CM | POA: Diagnosis not present

## 2016-07-16 DIAGNOSIS — E669 Obesity, unspecified: Secondary | ICD-10-CM

## 2016-07-16 DIAGNOSIS — E119 Type 2 diabetes mellitus without complications: Secondary | ICD-10-CM

## 2016-07-16 DIAGNOSIS — R319 Hematuria, unspecified: Secondary | ICD-10-CM

## 2016-07-16 LAB — POCT URINALYSIS DIPSTICK
Bilirubin, UA: NEGATIVE
Glucose, UA: NEGATIVE
LEUKOCYTES UA: NEGATIVE
Nitrite, UA: NEGATIVE
PH UA: 5.5
PROTEIN UA: NEGATIVE
SPEC GRAV UA: 1.025
Urobilinogen, UA: 0.2

## 2016-07-16 MED ORDER — TAMSULOSIN HCL 0.4 MG PO CAPS: 0.4000 mg | ORAL_CAPSULE | Freq: Every day | ORAL | 0 refills | Status: DC

## 2016-07-16 MED ORDER — PHENTERMINE HCL 37.5 MG PO TABS: 37.5000 mg | ORAL_TABLET | Freq: Every day | ORAL | 0 refills | Status: DC

## 2016-07-16 MED ORDER — METFORMIN HCL ER 750 MG PO TB24: ORAL_TABLET | ORAL | 1 refills | Status: DC

## 2016-07-16 MED ORDER — BUPROPION HCL ER (SR) 150 MG PO TB12: 150.0000 mg | ORAL_TABLET | Freq: Two times a day (BID) | ORAL | 1 refills | Status: DC

## 2016-07-16 MED ORDER — CIPROFLOXACIN HCL 500 MG PO TABS: 500.0000 mg | ORAL_TABLET | Freq: Two times a day (BID) | ORAL | 0 refills | Status: DC

## 2016-07-16 NOTE — Patient Instructions (Signed)

## 2016-07-16 NOTE — Progress Notes (Signed)
   Subjective:    Patient ID: Nina Trujillo, female    DOB: [birth year], 51 y.o.   MRN: [id number]  HPI  Pt is a 51 yo female who presents to the clinic with 1 week of right flank pain. She rates 4/10. Never had kidney stone before. Urinary frequency increasing. No dysuria. Normal bowel movements. Denies melena or hematochezia. No fever, chills, n/v/d.   She request phentermine again for weight loss.   DM- she is taking her medications but has not came for a A!C check.   Review of Systems  All other systems reviewed and are negative.      Objective:   Physical Exam  Constitutional: She is oriented to person, place, and time. She appears well-developed and well-nourished.  HENT:  Head: Normocephalic and atraumatic.  Cardiovascular: Normal rate, regular rhythm and normal heart sounds.   Pulmonary/Chest: Effort normal and breath sounds normal.  Right CVA tenderness.   Abdominal: Soft. Bowel sounds are normal. She exhibits no distension and no mass. There is no tenderness. There is no rebound and no guarding.  Neurological: She is alert and oriented to person, place, and time.  Psychiatric: She has a normal mood and affect. Her behavior is normal.          Assessment & Plan:  Marland KitchenMarland KitchenCnythia was seen today for right flank pain.  Diagnoses and all orders for this visit:  Right flank pain -     POCT urinalysis dipstick -     tamsulosin (FLOMAX) 0.4 MG CAPS capsule; Take 1 capsule (0.4 mg total) by mouth daily. -     ciprofloxacin (CIPRO) 500 MG tablet; Take 1 tablet (500 mg total) by mouth 2 (two) times daily. For 7 days. -     Urine Culture  Urinary frequency -     POCT urinalysis dipstick -     Urine Culture  Hematuria, unspecified type -     Urine Culture  Diabetes mellitus without complication (HCC) -     metFORMIN (GLUCOPHAGE-XR) 750 MG 24 hr tablet; TAKE ONE TABLET BY MOUTH ONCE DAILY WITH BREAKFAST  Obesity (BMI 30-39.9) -     phentermine (ADIPEX-P) 37.5 MG tablet;  Take 1 tablet (37.5 mg total) by mouth daily before breakfast.  Other orders -     buPROPion (WELLBUTRIN SR) 150 MG 12 hr tablet; Take 1 tablet (150 mg total) by mouth 2 (two) times daily.           .. Results for orders placed or performed in visit on 07/16/16  POCT urinalysis dipstick  Result Value Ref Range   Color, UA yellow    Clarity, UA slightly cloudy    Glucose, UA neg    Bilirubin, UA neg    Ketones, UA trace    Spec Grav, UA 1.025    Blood, UA moderate    pH, UA 5.5    Protein, UA neg    Urobilinogen, UA 0.2    Nitrite, UA neg    Leukocytes, UA Negative Negative   Will culture.  Concerned for kidney stone.  Pt is financially not able to perform scan for kidney stone evaluation.  Given flomax and cipro. Use NSAID for pain relief.   Follow up if worsening or no change in symptoms.   Come back for DM follow up.   Restarted phentermine for weight loss. Follow up in 1 month. Discussed side effects.

## 2016-07-18 LAB — URINE CULTURE

## 2016-07-18 NOTE — Progress Notes (Signed)
Call pt: discuss culture did not show any bacterial infection that needed to be treated.

## 2016-08-16 ENCOUNTER — Ambulatory Visit: Payer: PRIVATE HEALTH INSURANCE | Admitting: Physician Assistant

## 2017-01-03 ENCOUNTER — Other Ambulatory Visit: Payer: Self-pay | Admitting: Physician Assistant

## 2017-02-06 ENCOUNTER — Other Ambulatory Visit: Payer: Self-pay | Admitting: Physician Assistant

## 2017-02-28 ENCOUNTER — Other Ambulatory Visit: Payer: Self-pay | Admitting: Physician Assistant

## 2017-04-19 ENCOUNTER — Other Ambulatory Visit: Payer: Self-pay | Admitting: Physician Assistant

## 2017-04-19 DIAGNOSIS — E119 Type 2 diabetes mellitus without complications: Secondary | ICD-10-CM

## 2017-05-25 ENCOUNTER — Other Ambulatory Visit: Payer: Self-pay | Admitting: Physician Assistant

## 2017-07-05 ENCOUNTER — Other Ambulatory Visit: Payer: Self-pay | Admitting: Physician Assistant

## 2017-07-07 ENCOUNTER — Other Ambulatory Visit: Payer: Self-pay | Admitting: *Deleted

## 2017-08-08 ENCOUNTER — Encounter: Payer: Self-pay | Admitting: Physician Assistant

## 2017-08-08 ENCOUNTER — Ambulatory Visit (INDEPENDENT_AMBULATORY_CARE_PROVIDER_SITE_OTHER): Payer: PRIVATE HEALTH INSURANCE | Admitting: Physician Assistant

## 2017-08-08 VITALS — BP 146/66 | HR 93 | Ht 66.75 in | Wt 219.0 lb

## 2017-08-08 DIAGNOSIS — Z6834 Body mass index (BMI) 34.0-34.9, adult: Secondary | ICD-10-CM | POA: Diagnosis not present

## 2017-08-08 DIAGNOSIS — N951 Menopausal and female climacteric states: Secondary | ICD-10-CM

## 2017-08-08 DIAGNOSIS — I10 Essential (primary) hypertension: Secondary | ICD-10-CM

## 2017-08-08 DIAGNOSIS — F330 Major depressive disorder, recurrent, mild: Secondary | ICD-10-CM | POA: Diagnosis not present

## 2017-08-08 DIAGNOSIS — Z131 Encounter for screening for diabetes mellitus: Secondary | ICD-10-CM | POA: Diagnosis not present

## 2017-08-08 DIAGNOSIS — J3089 Other allergic rhinitis: Secondary | ICD-10-CM | POA: Diagnosis not present

## 2017-08-08 DIAGNOSIS — E119 Type 2 diabetes mellitus without complications: Secondary | ICD-10-CM

## 2017-08-08 DIAGNOSIS — Z13220 Encounter for screening for lipoid disorders: Secondary | ICD-10-CM | POA: Diagnosis not present

## 2017-08-08 DIAGNOSIS — J309 Allergic rhinitis, unspecified: Secondary | ICD-10-CM | POA: Insufficient documentation

## 2017-08-08 DIAGNOSIS — E6609 Other obesity due to excess calories: Secondary | ICD-10-CM

## 2017-08-08 MED ORDER — BUPROPION HCL ER (SR) 150 MG PO TB12: 150.0000 mg | ORAL_TABLET | Freq: Two times a day (BID) | ORAL | 1 refills | Status: DC

## 2017-08-08 MED ORDER — METFORMIN HCL ER 750 MG PO TB24: ORAL_TABLET | ORAL | 1 refills | Status: DC

## 2017-08-08 MED ORDER — ESTRADIOL 2 MG PO TABS: 2.0000 mg | ORAL_TABLET | Freq: Every day | ORAL | 3 refills | Status: DC

## 2017-08-08 MED ORDER — HYDROCHLOROTHIAZIDE 25 MG PO TABS: 25.0000 mg | ORAL_TABLET | Freq: Every day | ORAL | 1 refills | Status: DC

## 2017-08-08 MED ORDER — PHENTERMINE HCL 37.5 MG PO TABS: 37.5000 mg | ORAL_TABLET | Freq: Every day | ORAL | 0 refills | Status: DC

## 2017-08-08 NOTE — Patient Instructions (Signed)
flonase 2 sprays each nostril.

## 2017-08-08 NOTE — Progress Notes (Signed)
Subjective:    Patient ID: Nina Trujillo, female    DOB: August 26, 1965, 52 y.o.   MRN: [id number]  HPI  Pt is a 52 yo obese female who presents to the clinic for medication refill.   Pt has been laid off since 2011. She has been working for [employer]. She has not had great insurance.   HTN- been out of medication for weeks. Denies any CP, palpitations, headaches, vision changes.   DM-not on medications. Not checking sugars. Not keeping a diabetic diet.   MDD- she is out of medication. She does find herself frustrated a lot. No SI/HC.   Menopausal symptoms would like to stay on estrace.   She would like to restart phentermine. Good response before. She is not currently dieting or exercising.   .. Active Ambulatory Problems    Diagnosis Date Noted  . Tobacco dependence 05/31/2014  . Abnormal weight gain 05/31/2014  . Obese 05/31/2014  . Essential hypertension, benign 05/31/2014  . Diabetes mellitus without complication ([place]) 42/68/3419  . Menopausal symptoms 05/31/2014  . Hidradenitis suppurativa 12/13/2014  . CKD (chronic kidney disease) stage 3, GFR 30-59 ml/min (HCC) 12/16/2014  . Obesity 03/21/2015  . Depression 03/21/2015  . Microalbuminuria due to type 2 diabetes mellitus ([place]) 08/25/2015  . Allergic rhinitis due to allergen 08/08/2017   Resolved Ambulatory Problems    Diagnosis Date Noted  . No Resolved Ambulatory Problems   Past Medical History:  Diagnosis Date  . Diabetes mellitus without complication ([place])   . Hypertension      Review of Systems  All other systems reviewed and are negative.      Objective:   Physical Exam  Constitutional: She is oriented to person, place, and time. She appears well-developed and well-nourished.  Obese.   HENT:  Head: Normocephalic and atraumatic.  Neck: Normal range of motion. Neck supple.  Cardiovascular: Normal rate, regular rhythm and normal heart sounds.  Pulmonary/Chest: Effort normal and breath sounds normal.  She has no wheezes.  Lymphadenopathy:    She has no cervical adenopathy.  Neurological: She is alert and oriented to person, place, and time.  Skin: No rash noted.  Psychiatric: She has a normal mood and affect. Her behavior is normal.          Assessment & Plan:  Marland KitchenMarland KitchenNivedita was seen today for diabetes and hypertension.  Diagnoses and all orders for this visit:  Diabetes mellitus without complication ([place]) -     metFORMIN (GLUCOPHAGE-XR) 750 MG 24 hr tablet; TAKE 1 TABLET BY MOUTH ONCE DAILY WITH BREAKFAST -     Hemoglobin A1c  Screening for lipid disorders -     Lipid Panel w/reflex Direct LDL  Screening for diabetes mellitus -     COMPLETE METABOLIC PANEL WITH GFR  Seasonal allergic rhinitis due to other allergic trigger  Controlled type 2 diabetes mellitus without complication, without long-term current use of insulin (HCC)  Mild episode of recurrent major depressive disorder (HCC) -     buPROPion (WELLBUTRIN SR) 150 MG 12 hr tablet; Take 1 tablet (150 mg total) by mouth 2 (two) times daily.  Essential hypertension, benign -     hydrochlorothiazide (HYDRODIURIL) 25 MG tablet; Take 1 tablet (25 mg total) by mouth daily.  Class 1 obesity due to excess calories with serious comorbidity and body mass index (BMI) of 34.0 to 34.9 in adult -     phentermine (ADIPEX-P) 37.5 MG tablet; Take 1 tablet (37.5 mg total) by mouth daily before  breakfast.  Menopausal symptoms -     estradiol (ESTRACE) 2 MG tablet; Take 1 tablet (2 mg total) by mouth daily.   Medication refills done today.  NEEDS LaBS. Ordered for patient.   Recheck BP in 1 month.   Started phentermine for weight at patient's request. Discussed duration and expectations. .. Discussed 150 minutes of exercise a week.  Encouraged vitamin D 1000 units and Calcium 1300mg  or 4 servings of dairy a day.    Risk factors for taking estrace for many years discussed.

## 2017-08-10 ENCOUNTER — Encounter: Payer: Self-pay | Admitting: Physician Assistant

## 2018-04-12 ENCOUNTER — Other Ambulatory Visit: Payer: Self-pay | Admitting: Physician Assistant

## 2018-04-12 DIAGNOSIS — I10 Essential (primary) hypertension: Secondary | ICD-10-CM

## 2018-05-19 ENCOUNTER — Encounter: Payer: Self-pay | Admitting: Physician Assistant

## 2018-05-19 ENCOUNTER — Ambulatory Visit (INDEPENDENT_AMBULATORY_CARE_PROVIDER_SITE_OTHER): Payer: PRIVATE HEALTH INSURANCE | Admitting: Physician Assistant

## 2018-05-19 VITALS — BP 128/72 | HR 90 | Temp 98.1°F | Ht 65.0 in | Wt 210.3 lb

## 2018-05-19 DIAGNOSIS — Z13220 Encounter for screening for lipoid disorders: Secondary | ICD-10-CM

## 2018-05-19 DIAGNOSIS — Z23 Encounter for immunization: Secondary | ICD-10-CM

## 2018-05-19 DIAGNOSIS — I10 Essential (primary) hypertension: Secondary | ICD-10-CM | POA: Diagnosis not present

## 2018-05-19 DIAGNOSIS — E119 Type 2 diabetes mellitus without complications: Secondary | ICD-10-CM

## 2018-05-19 DIAGNOSIS — F330 Major depressive disorder, recurrent, mild: Secondary | ICD-10-CM

## 2018-05-19 DIAGNOSIS — Z0000 Encounter for general adult medical examination without abnormal findings: Secondary | ICD-10-CM | POA: Diagnosis not present

## 2018-05-19 DIAGNOSIS — N951 Menopausal and female climacteric states: Secondary | ICD-10-CM

## 2018-05-19 DIAGNOSIS — H8113 Benign paroxysmal vertigo, bilateral: Secondary | ICD-10-CM

## 2018-05-19 DIAGNOSIS — Z1231 Encounter for screening mammogram for malignant neoplasm of breast: Secondary | ICD-10-CM

## 2018-05-19 DIAGNOSIS — J0100 Acute maxillary sinusitis, unspecified: Secondary | ICD-10-CM

## 2018-05-19 DIAGNOSIS — E782 Mixed hyperlipidemia: Secondary | ICD-10-CM

## 2018-05-19 DIAGNOSIS — Z1211 Encounter for screening for malignant neoplasm of colon: Secondary | ICD-10-CM

## 2018-05-19 DIAGNOSIS — M549 Dorsalgia, unspecified: Secondary | ICD-10-CM

## 2018-05-19 DIAGNOSIS — Z1159 Encounter for screening for other viral diseases: Secondary | ICD-10-CM

## 2018-05-19 LAB — POCT UA - MICROALBUMIN
Albumin/Creatinine Ratio, Urine, POC: <30
Creatinine, POC: 100 mg/dL
Microalbumin Ur, POC: 30 mg/L

## 2018-05-19 MED ORDER — BUPROPION HCL ER (SR) 150 MG PO TB12: 150.0000 mg | ORAL_TABLET | Freq: Two times a day (BID) | ORAL | 1 refills | Status: DC

## 2018-05-19 MED ORDER — ESCITALOPRAM OXALATE 5 MG PO TABS: 5.0000 mg | ORAL_TABLET | Freq: Every day | ORAL | 1 refills | Status: DC

## 2018-05-19 MED ORDER — ATORVASTATIN CALCIUM 20 MG PO TABS: 20.0000 mg | ORAL_TABLET | Freq: Every day | ORAL | 3 refills | Status: DC

## 2018-05-19 MED ORDER — METFORMIN HCL ER 750 MG PO TB24: ORAL_TABLET | ORAL | 1 refills | Status: DC

## 2018-05-19 MED ORDER — FLUTICASONE PROPIONATE 50 MCG/ACT NA SUSP: 2.0000 | Freq: Every day | NASAL | 6 refills | Status: DC

## 2018-05-19 MED ORDER — HYDROCHLOROTHIAZIDE 25 MG PO TABS: 25.0000 mg | ORAL_TABLET | Freq: Every day | ORAL | 1 refills | Status: DC

## 2018-05-19 MED ORDER — ESTRADIOL 2 MG PO TABS: 2.0000 mg | ORAL_TABLET | Freq: Every day | ORAL | 3 refills | Status: DC

## 2018-05-19 MED ORDER — AZITHROMYCIN 250 MG PO TABS: ORAL_TABLET | ORAL | 0 refills | Status: DC

## 2018-05-19 NOTE — Patient Instructions (Signed)
Start exercises for BPPV.  Flexeril as needed with icy hot patches/biofreeze/heat for upper back pain.  Get eye exam.  GEt labs.   Health Maintenance, Female Adopting a healthy lifestyle and getting preventive care can go a long way to promote health and wellness. Talk with your health care provider about what schedule of regular examinations is right for you. This is a good chance for you to check in with your provider about disease prevention and staying healthy. In between checkups, there are plenty of things you can do on your own. Experts have done a lot of research about which lifestyle changes and preventive measures are most likely to keep you healthy. Ask your health care provider for more information. Weight and diet Eat a healthy diet  Be sure to include plenty of vegetables, fruits, low-fat dairy products, and lean protein.  Do not eat a lot of foods high in solid fats, added sugars, or salt.  Get regular exercise. This is one of the most important things you can do for your health. ? Most adults should exercise for at least 150 minutes each week. The exercise should increase your heart rate and make you sweat (moderate-intensity exercise). ? Most adults should also do strengthening exercises at least twice a week. This is in addition to the moderate-intensity exercise. Maintain a healthy weight  Body mass index (BMI) is a measurement that can be used to identify possible weight problems. It estimates body fat based on height and weight. Your health care provider can help determine your BMI and help you achieve or maintain a healthy weight.  For females 69 years of age and older: ? A BMI below 18.5 is considered underweight. ? A BMI of 18.5 to 24.9 is normal. ? A BMI of 25 to 29.9 is considered overweight. ? A BMI of 30 and above is considered obese. Watch levels of cholesterol and blood lipids  You should start having your blood tested for lipids and cholesterol at 55 years  of age, then have this test every 5 years.  You may need to have your cholesterol levels checked more often if: ? Your lipid or cholesterol levels are high. ? You are older than 55 years of age. ? You are at high risk for heart disease. Cancer screening Lung Cancer  Lung cancer screening is recommended for adults 71-47 years old who are at high risk for lung cancer because of a history of smoking.  A yearly low-dose CT scan of the lungs is recommended for people who: ? Currently smoke. ? Have quit within the past 15 years. ? Have at least a 30-pack-year history of smoking. A pack year is smoking an average of one pack of cigarettes a day for 1 year.  Yearly screening should continue until it has been 15 years since you quit.  Yearly screening should stop if you develop a health problem that would prevent you from having lung cancer treatment. Breast Cancer  Practice breast self-awareness. This means understanding how your breasts normally appear and feel.  It also means doing regular breast self-exams. Let your health care provider know about any changes, no matter how small.  If you are in your 20s or 30s, you should have a clinical breast exam (CBE) by a health care provider every 1-3 years as part of a regular health exam.  If you are 108 or older, have a CBE every year. Also consider having a breast X-ray (mammogram) every year.  If you have a  family history of breast cancer, talk to your health care provider about genetic screening.  If you are at high risk for breast cancer, talk to your health care provider about having an MRI and a mammogram every year.  Breast cancer gene (BRCA) assessment is recommended for women who have family members with BRCA-related cancers. BRCA-related cancers include: ? Breast. ? Ovarian. ? Tubal. ? Peritoneal cancers.  Results of the assessment will determine the need for genetic counseling and BRCA1 and BRCA2 testing. Cervical Cancer Your  health care provider may recommend that you be screened regularly for cancer of the pelvic organs (ovaries, uterus, and vagina). This screening involves a pelvic examination, including checking for microscopic changes to the surface of your cervix (Pap test). You may be encouraged to have this screening done every 3 years, beginning at age 44.  For women ages 46-65, health care providers may recommend pelvic exams and Pap testing every 3 years, or they may recommend the Pap and pelvic exam, combined with testing for human papilloma virus (HPV), every 5 years. Some types of HPV increase your risk of cervical cancer. Testing for HPV may also be done on women of any age with unclear Pap test results.  Other health care providers may not recommend any screening for nonpregnant women who are considered low risk for pelvic cancer and who do not have symptoms. Ask your health care provider if a screening pelvic exam is right for you.  If you have had past treatment for cervical cancer or a condition that could lead to cancer, you need Pap tests and screening for cancer for at least 20 years after your treatment. If Pap tests have been discontinued, your risk factors (such as having a new sexual partner) need to be reassessed to determine if screening should resume. Some women have medical problems that increase the chance of getting cervical cancer. In these cases, your health care provider may recommend more frequent screening and Pap tests. Colorectal Cancer  This type of cancer can be detected and often prevented.  Routine colorectal cancer screening usually begins at 55 years of age and continues through 55 years of age.  Your health care provider may recommend screening at an earlier age if you have risk factors for colon cancer.  Your health care provider may also recommend using home test kits to check for hidden blood in the stool.  A small camera at the end of a tube can be used to examine your  colon directly (sigmoidoscopy or colonoscopy). This is done to check for the earliest forms of colorectal cancer.  Routine screening usually begins at age 22.  Direct examination of the colon should be repeated every 5-10 years through 55 years of age. However, you may need to be screened more often if early forms of precancerous polyps or small growths are found. Skin Cancer  Check your skin from head to toe regularly.  Tell your health care provider about any new moles or changes in moles, especially if there is a change in a mole's shape or color.  Also tell your health care provider if you have a mole that is larger than the size of a pencil eraser.  Always use sunscreen. Apply sunscreen liberally and repeatedly throughout the day.  Protect yourself by wearing long sleeves, pants, a wide-brimmed hat, and sunglasses whenever you are outside. Heart disease, diabetes, and high blood pressure  High blood pressure causes heart disease and increases the risk of stroke. High blood pressure  is more likely to develop in: ? People who have blood pressure in the high end of the normal range (130-139/85-89 mm Hg). ? People who are overweight or obese. ? People who are African American.  If you are 59-36 years of age, have your blood pressure checked every 3-5 years. If you are 15 years of age or older, have your blood pressure checked every year. You should have your blood pressure measured twice-once when you are at a hospital or clinic, and once when you are not at a hospital or clinic. Record the average of the two measurements. To check your blood pressure when you are not at a hospital or clinic, you can use: ? An automated blood pressure machine at a pharmacy. ? A home blood pressure monitor.  If you are between 17 years and 8 years old, ask your health care provider if you should take aspirin to prevent strokes.  Have regular diabetes screenings. This involves taking a blood sample to  check your fasting blood sugar level. ? If you are at a normal weight and have a low risk for diabetes, have this test once every three years after 55 years of age. ? If you are overweight and have a high risk for diabetes, consider being tested at a younger age or more often. Preventing infection Hepatitis B  If you have a higher risk for hepatitis B, you should be screened for this virus. You are considered at high risk for hepatitis B if: ? You were born in a country where hepatitis B is common. Ask your health care provider which countries are considered high risk. ? Your parents were born in a high-risk country, and you have not been immunized against hepatitis B (hepatitis B vaccine). ? You have HIV or AIDS. ? You use needles to inject street drugs. ? You live with someone who has hepatitis B. ? You have had sex with someone who has hepatitis B. ? You get hemodialysis treatment. ? You take certain medicines for conditions, including cancer, organ transplantation, and autoimmune conditions. Hepatitis C  Blood testing is recommended for: ? Everyone born from [birth year] through 1967. ? Anyone with known risk factors for hepatitis C. Sexually transmitted infections (STIs)  You should be screened for sexually transmitted infections (STIs) including gonorrhea and chlamydia if: ? You are sexually active and are younger than 55 years of age. ? You are older than 55 years of age and your health care provider tells you that you are at risk for this type of infection. ? Your sexual activity has changed since you were last screened and you are at an increased risk for chlamydia or gonorrhea. Ask your health care provider if you are at risk.  If you do not have HIV, but are at risk, it may be recommended that you take a prescription medicine daily to prevent HIV infection. This is called pre-exposure prophylaxis (PrEP). You are considered at risk if: ? You are sexually active and do not regularly use  condoms or know the HIV status of your partner(s). ? You take drugs by injection. ? You are sexually active with a partner who has HIV. Talk with your health care provider about whether you are at high risk of being infected with HIV. If you choose to begin PrEP, you should first be tested for HIV. You should then be tested every 3 months for as long as you are taking PrEP. Pregnancy  If you are premenopausal and you may become  pregnant, ask your health care provider about preconception counseling.  If you may become pregnant, take 400 to 800 micrograms (mcg) of folic acid every day.  If you want to prevent pregnancy, talk to your health care provider about birth control (contraception). Osteoporosis and menopause  Osteoporosis is a disease in which the bones lose minerals and strength with aging. This can result in serious bone fractures. Your risk for osteoporosis can be identified using a bone density scan.  If you are 48 years of age or older, or if you are at risk for osteoporosis and fractures, ask your health care provider if you should be screened.  Ask your health care provider whether you should take a calcium or vitamin D supplement to lower your risk for osteoporosis.  Menopause may have certain physical symptoms and risks.  Hormone replacement therapy may reduce some of these symptoms and risks. Talk to your health care provider about whether hormone replacement therapy is right for you. Follow these instructions at home:  Schedule regular health, dental, and eye exams.  Stay current with your immunizations.  Do not use any tobacco products including cigarettes, chewing tobacco, or electronic cigarettes.  If you are pregnant, do not drink alcohol.  If you are breastfeeding, limit how much and how often you drink alcohol.  Limit alcohol intake to no more than 1 drink per day for nonpregnant women. One drink equals 12 ounces of beer, 5 ounces of wine, or 1 ounces of  hard liquor.  Do not use street drugs.  Do not share needles.  Ask your health care provider for help if you need support or information about quitting drugs.  Tell your health care provider if you often feel depressed.  Tell your health care provider if you have ever been abused or do not feel safe at home. This information is not intended to replace advice given to you by your health care provider. Make sure you discuss any questions you have with your health care provider. Document Released: 11/16/2010 Document Revised: 10/09/2015 Document Reviewed: 02/04/2015 Elsevier Interactive Patient Education  2019 [employer].

## 2018-05-19 NOTE — Progress Notes (Signed)
Subjective:     Nina Trujillo is a 52 y.o. female and is here for a comprehensive physical exam. The patient reports problems - multiple concerns. see below.    She wants to get healthy this year.   She feels like her mood is not good and struggling with anxiety and depression. She has lost multiple people in her family and she is always the one making the "calls". Her brother recently had a heart attack and she could not deal with it. 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 Denies any suicide plan but she feels at time " not worth her being here".   She is also having some dizziness for last 3-4 weeks. Had a cold a few weeks before. It resolved. She has some sinus pressure and "feels tight around her eyes". Not taking any medication. No fever, chills, body aches.   She has some upper back tightness and pain. No injury. No radiation of pain or numbness and tingling into her extremities.   She has not being managing her DM. She does report frequent urination.    Social History   Socioeconomic History  . Marital status: Single    Spouse name: Not on file  . Number of children: Not on file  . Years of education: Not on file  . Highest education level: Not on file  Occupational History  . Not on file  Social Needs  . Financial resource strain: Not on file  . Food insecurity:    Worry: Not on file    Inability: Not on file  . Transportation needs:    Medical: Not on file    Non-medical: Not on file  Tobacco Use  . Smoking status: Current Some Day Smoker  . Smokeless tobacco: Never Used  Substance and Sexual Activity  . Alcohol use: No    Alcohol/week: 0.0 standard drinks  . Drug use: No  . Sexual activity: Yes  Lifestyle  . Physical activity:    Days per week: Not on file    Minutes per session: Not on file  . Stress: Not on file  Relationships  . Social connections:    Talks on phone: Not on file    Gets together: Not on file   Attends religious service: Not on file    Active member of club or organization: Not on file    Attends meetings of clubs or organizations: Not on file    Relationship status: Not on file  . Intimate partner violence:    Fear of current or ex partner: Not on file    Emotionally abused: Not on file    Physically abused: Not on file    Forced sexual activity: Not on file  Other Topics Concern  . Not on file  Social History Narrative  . Not on file   Health Maintenance  Topic Date Due  . OPHTHALMOLOGY EXAM  12/21/1973  . HIV Screening  12/22/1978  . COLONOSCOPY  12/21/2013  . MAMMOGRAM  02/15/2016  . PAP SMEAR-Modifier  05/31/2024 (Originally 05/17/2013)  . HEMOGLOBIN A1C  11/17/2018  . FOOT EXAM  05/20/2019  . URINE MICROALBUMIN  05/20/2019  . TETANUS/TDAP  08/16/2022  . INFLUENZA VACCINE  Completed  . PNEUMOCOCCAL POLYSACCHARIDE VACCINE AGE 36-64 HIGH RISK  Completed  . Hepatitis C Screening  Completed    The following portions of the patient's history were reviewed and updated as appropriate: allergies, current medications, past family history,  past medical history, past social history, past surgical history and problem list.  Review of Systems Pertinent items noted in HPI and remainder of comprehensive ROS otherwise negative.   Objective:    BP 128/72   Pulse 90   Temp 98.1 F (36.7 C) (Oral)   Ht 5\' 5"  (1.651 m)   Wt 210 lb 4.8 oz (95.4 kg)   SpO2 100%   BMI 35.00 kg/m  General appearance: alert, cooperative and appears stated age Head: Normocephalic, without obvious abnormality, atraumatic Eyes: conjunctivae/corneas clear. PERRL, EOM's intact. Fundi benign. Ears: normal TM's and external ear canals both ears Nose: Nares normal. Septum midline. Mucosa normal. No drainage or sinus tenderness. Throat: lips, mucosa, and tongue normal; teeth and gums normal Neck: no adenopathy, no carotid bruit, no JVD, supple, symmetrical, trachea midline and thyroid not enlarged,  symmetric, no tenderness/mass/nodules Back: symmetric, no curvature. ROM normal. No CVA tenderness. Lungs: clear to auscultation bilaterally Heart: regular rate and rhythm, S1, S2 normal, no murmur, click, rub or gallop Abdomen: soft, non-tender; bowel sounds normal; no masses,  no organomegaly Extremities: extremities normal, atraumatic, no cyanosis or edema Pulses: 2+ and symmetric Skin: Skin color, texture, turgor normal. No rashes or lesions Lymph nodes: Cervical, supraclavicular, and axillary nodes normal. Neurologic: Alert and oriented X 3, normal strength and tone. Normal symmetric reflexes. Normal coordination and gait   dix hallpike negative billaterally.  Assessment:    Healthy female exam.      Plan:     Marland KitchenMarland KitchenPatrice was seen today for annual exam.  Diagnoses and all orders for this visit:  Routine physical examination  Mild episode of recurrent major depressive disorder ([place]) -     buPROPion (WELLBUTRIN SR) 150 MG 12 hr tablet; Take 1 tablet (150 mg total) by mouth 2 (two) times daily. -     escitalopram (LEXAPRO) 5 MG tablet; Take 1 tablet (5 mg total) by mouth daily.  Menopausal symptoms -     estradiol (ESTRACE) 2 MG tablet; Take 1 tablet (2 mg total) by mouth daily.  Essential hypertension, benign -     hydrochlorothiazide (HYDRODIURIL) 25 MG tablet; Take 1 tablet (25 mg total) by mouth daily. Patient is due for a follow-up appointment with PCP for future refills. -     COMPLETE METABOLIC PANEL WITH GFR  Diabetes mellitus without complication ([place]) -     Ambulatory referral to Ophthalmology -     Hemoglobin A1c -     metFORMIN (GLUCOPHAGE-XR) 750 MG 24 hr tablet; TAKE 1 TABLET BY MOUTH ONCE DAILY WITH BREAKFAST -     COMPLETE METABOLIC PANEL WITH GFR -     POCT UA - Microalbumin  Need for hepatitis C screening test -     Hepatitis C Antibody  Screening for lipid disorders -     Lipid Panel w/reflex Direct LDL  Needs flu shot -     Flu Vaccine QUAD 6+ mos  PF IM (Fluarix Quad PF)  Benign paroxysmal positional vertigo due to bilateral vestibular disorder -     azithromycin (ZITHROMAX) 250 MG tablet; Take 2 tablets now and then one tablet for 4 days. -     fluticasone (FLONASE) 50 MCG/ACT nasal spray; Place 2 sprays into both nostrils daily.  Upper back pain  Visit for screening mammogram -     MM 3D SCREEN BREAST BILATERAL  Colon cancer screening -     Ambulatory referral to Gastroenterology  Acute non-recurrent maxillary sinusitis -     azithromycin (  ZITHROMAX) 250 MG tablet; Take 2 tablets now and then one tablet for 4 days. -     fluticasone (FLONASE) 50 MCG/ACT nasal spray; Place 2 sprays into both nostrils daily.  Mixed hyperlipidemia -     atorvastatin (LIPITOR) 20 MG tablet; Take 1 tablet (20 mg total) by mouth daily.   .. Depression screen [facility] 2/9 05/19/2018  Decreased Interest 3  Down, Depressed, Hopeless 3  PHQ - 2 Score 6  Altered sleeping 3  Tired, decreased energy 2  Change in appetite 2  Feeling bad or failure about yourself  3  Trouble concentrating 2  Moving slowly or fidgety/restless 0  Suicidal thoughts 0  PHQ-9 Score 18  Difficult doing work/chores Very difficult   .[name] GAD 7 : Generalized Anxiety Score 05/19/2018  Nervous, Anxious, on Edge 2  Control/stop worrying 2  Worry too much - different things 2  Trouble relaxing 3  Restless 2  Easily annoyed or irritable 2  Afraid - awful might happen 2  Total GAD 7 Score 15  Anxiety Difficulty Not difficult at all   .[name] Discussed 150 minutes of exercise a week.  Encouraged vitamin D 1000 units and Calcium 1300mg  or 4 servings of dairy a day.  Mammogram and colonoscopy ordered.  Will get labs to evaluate A1C etc.   Started lexapro for mood. Discussed side effects. Follow up in 6 weeks. Discussed counseling. Pt will hold off due to money and time. Discussed exercise to help with mood.   Some dizziness with EOM but negative dix hallipike. I do suspect some BPPV  and possible to be due to sinus infection. Treated with zpak and flonase. Given epley manuevers. Follow up if not improving in next week or so and worsening.   Needs more evaluation of upper back pain. Given exercises. Discussed symptomatic care with biofreeze, massage, ibuprofen. Follow up as needed.   Follow up in 6 weeks.      See After Visit Summary for Counseling Recommendations

## 2018-05-20 LAB — COMPLETE METABOLIC PANEL WITH GFR
AG RATIO: 1.6 (calc) (ref 1.0–2.5)
ALT: 18 U/L (ref 6–29)
AST: 18 U/L (ref 10–35)
Albumin: 4.4 g/dL (ref 3.6–5.1)
Alkaline phosphatase (APISO): 107 U/L (ref 33–130)
BUN/Creatinine Ratio: 14 (calc) (ref 6–22)
BUN: 17 mg/dL (ref 7–25)
CO2: 25 mmol/L (ref 20–32)
Calcium: 10 mg/dL (ref 8.6–10.4)
Chloride: 102 mmol/L (ref 98–110)
Creat: 1.25 mg/dL — ABNORMAL HIGH (ref 0.50–1.05)
GFR, Est African American: 56 mL/min/{1.73_m2} — ABNORMAL LOW (ref >=60)
GFR, Est Non African American: 49 mL/min/{1.73_m2} — ABNORMAL LOW (ref >=60)
Globulin: 2.8 g/dL (calc) (ref 1.9–3.7)
Glucose, Bld: 347 mg/dL — ABNORMAL HIGH (ref 65–99)
POTASSIUM: 4.2 mmol/L (ref 3.5–5.3)
Sodium: 137 mmol/L (ref 135–146)
Total Bilirubin: 0.3 mg/dL (ref 0.2–1.2)
Total Protein: 7.2 g/dL (ref 6.1–8.1)

## 2018-05-20 LAB — LIPID PANEL W/REFLEX DIRECT LDL
CHOL/HDL RATIO: 4.4 (calc) (ref <5.0)
Cholesterol: 192 mg/dL (ref <200)
HDL: 44 mg/dL — ABNORMAL LOW (ref >50)
LDL Cholesterol (Calc): 115 mg/dL (calc) — ABNORMAL HIGH
Non-HDL Cholesterol (Calc): 148 mg/dL (calc) — ABNORMAL HIGH (ref <130)
Triglycerides: 213 mg/dL — ABNORMAL HIGH (ref <150)

## 2018-05-20 LAB — HEMOGLOBIN A1C
Hgb A1c MFr Bld: 10.8 % of total Hgb — ABNORMAL HIGH (ref <5.7)
Mean Plasma Glucose: 263 (calc)
eAG (mmol/L): 14.6 (calc)

## 2018-05-20 LAB — HEPATITIS C ANTIBODY
Hepatitis C Ab: NONREACTIVE
SIGNAL TO CUT-OFF: 0.03 (ref <1.00)

## 2018-05-22 ENCOUNTER — Encounter: Payer: Self-pay | Admitting: Physician Assistant

## 2018-05-22 DIAGNOSIS — E785 Hyperlipidemia, unspecified: Secondary | ICD-10-CM | POA: Insufficient documentation

## 2018-05-22 DIAGNOSIS — E782 Mixed hyperlipidemia: Secondary | ICD-10-CM | POA: Insufficient documentation

## 2018-05-22 NOTE — Progress Notes (Signed)
Call pt: Hep C negative.  A1C is way out of range at 10.8. that is very elevated. Average sugars are ranging 350's or above. We have to get this under control and could be effecting vision/dizziness/urine frequency.   I would like you to consider starting a injectable medication that is not insulin. We can discuss at follow up in 1 month. I would move it up to one month.   I would also like to go ahead and start a medication that causes you to urinate off some sugar. Are you ok with me sending this?   LDL not to goal. Are you ok with increasing lipitor to 40mg  daily.

## 2018-05-29 NOTE — Progress Notes (Signed)
Letter mailed

## 2018-06-20 ENCOUNTER — Ambulatory Visit (INDEPENDENT_AMBULATORY_CARE_PROVIDER_SITE_OTHER): Payer: PRIVATE HEALTH INSURANCE | Admitting: Physician Assistant

## 2018-06-20 ENCOUNTER — Encounter: Payer: Self-pay | Admitting: Physician Assistant

## 2018-06-20 VITALS — BP 117/66 | HR 102 | Ht 65.0 in | Wt 210.0 lb

## 2018-06-20 DIAGNOSIS — N183 Chronic kidney disease, stage 3 unspecified: Secondary | ICD-10-CM

## 2018-06-20 DIAGNOSIS — E6609 Other obesity due to excess calories: Secondary | ICD-10-CM

## 2018-06-20 DIAGNOSIS — F17200 Nicotine dependence, unspecified, uncomplicated: Secondary | ICD-10-CM

## 2018-06-20 DIAGNOSIS — Z6834 Body mass index (BMI) 34.0-34.9, adult: Secondary | ICD-10-CM

## 2018-06-20 DIAGNOSIS — E1165 Type 2 diabetes mellitus with hyperglycemia: Secondary | ICD-10-CM

## 2018-06-20 DIAGNOSIS — I10 Essential (primary) hypertension: Secondary | ICD-10-CM | POA: Diagnosis not present

## 2018-06-20 MED ORDER — EMPAGLIFLOZIN-LINAGLIPTIN 10-5 MG PO TABS: 1.0000 | ORAL_TABLET | Freq: Every day | ORAL | 2 refills | Status: DC

## 2018-06-20 MED ORDER — SEMAGLUTIDE(0.25 OR 0.5MG/DOS) 2 MG/1.5ML ~~LOC~~ SOPN: 0.5000 mg | PEN_INJECTOR | SUBCUTANEOUS | 2 refills | Status: DC

## 2018-06-20 MED ORDER — ERTUGLIFLOZIN-SITAGLIPTIN 5-100 MG PO TABS: 1.0000 | ORAL_TABLET | Freq: Every day | ORAL | 2 refills | Status: DC

## 2018-06-20 NOTE — Patient Instructions (Addendum)
Metformin steglujan daily.  ozempic weekly Diet Diet Diet  I will make diabetic nutrition referral.   Follow up in 1 month.

## 2018-06-20 NOTE — Progress Notes (Signed)
Subjective:    Patient ID: Nina Trujillo, female    DOB: [birth year], 53 y.o.   MRN: [id number]  HPI  Pt is a 53 yo female with T2DM, HTN, current smoker, anxiety and depression who presents to the clinic to discuss plan for Diabetes. Her labs were done after CPE and found to have very elevated A!C. She admits to many symptoms such as blurred vision, increased thirst, increased urination frequency. She is taking metformin. She has been checking sugars and ranging from 200 to 530's. Not made any major changes in diet. She is walking daily. On lipitor.   Her mood is better with lexapro. No side effects.   .. Active Ambulatory Problems    Diagnosis Date Noted  . Tobacco dependence 05/31/2014  . Abnormal weight gain 05/31/2014  . Obese 05/31/2014  . Essential hypertension, benign 05/31/2014  . Diabetes mellitus without complication ([place]) 52/77/8242  . Menopausal symptoms 05/31/2014  . Hidradenitis suppurativa 12/13/2014  . CKD (chronic kidney disease) stage 3, GFR 30-59 ml/min (HCC) 12/16/2014  . Class 1 obesity due to excess calories with serious comorbidity and body mass index (BMI) of 34.0 to 34.9 in adult 03/21/2015  . Depression 03/21/2015  . Microalbuminuria due to type 2 diabetes mellitus ([place]) 08/25/2015  . Allergic rhinitis due to allergen 08/08/2017  . Upper back pain 05/19/2018  . Benign paroxysmal positional vertigo due to bilateral vestibular disorder 05/19/2018  . Mixed hyperlipidemia 05/22/2018  . Dyslipidemia, goal LDL below 70 05/22/2018   Resolved Ambulatory Problems    Diagnosis Date Noted  . No Resolved Ambulatory Problems   Past Medical History:  Diagnosis Date  . Hypertension        Review of Systems See HPI.     Objective:   Physical Exam Vitals signs reviewed.  Constitutional:      Appearance: Normal appearance.  HENT:     Head: Normocephalic.  Cardiovascular:     Rate and Rhythm: Normal rate and regular rhythm.     Pulses: Normal pulses.   Pulmonary:     Effort: Pulmonary effort is normal.  Neurological:     General: No focal deficit present.     Mental Status: She is alert and oriented to person, place, and time.  Psychiatric:        Mood and Affect: Mood normal.           Assessment & Plan:  Marland KitchenMarland KitchenLynnell was seen today for follow-up and diabetes.  Diagnoses and all orders for this visit:  Uncontrolled type 2 diabetes mellitus with hyperglycemia ([place]) -     Discontinue: Empagliflozin-linaGLIPtin (GLYXAMBI) 10-5 MG TABS; Take 1 tablet by mouth daily. -     Semaglutide,0.25 or 0.5MG /DOS, (OZEMPIC, 0.25 OR 0.5 MG/DOSE,) 2 MG/1.5ML SOPN; Inject 0.5 mg into the skin once a week. -     Ertugliflozin-SITagliptin (STEGLUJAN) 5-100 MG TABS; Take 1 tablet by mouth daily. -     Referral to Nutrition and Diabetes Services  Essential hypertension, benign  CKD (chronic kidney disease) stage 3, GFR 30-59 ml/min (HCC) -     Referral to Nutrition and Diabetes Services  Class 1 obesity due to excess calories with serious comorbidity and body mass index (BMI) of 34.0 to 34.9 in adult -     Referral to Nutrition and Diabetes Services  Tobacco dependence   Too soon for A!C. This is a one month follow up.  Follow up in 1 months with DM log.  Start ozempic and steglujan. Discussed how to  use and side effects.  Nutrition counseling given and made referral.  Discussed disease process and importance of control.  Pt on statin.  BP controlled. Will hold on ace/arb.   Pt is symptomatic with vision changes, increased urination. She questions FMLA. I told her this is a controllable disease. We could write her out for a few weeks while we get sugars a little better controlled but would not be written out for longer than 1-3 months. If needs file with HR and bring in paperwork to fill out.   Discussed smoking cessation. Pt declined today.   Mood controlled on lexapro/wellbutrin.   [name].Spent 30 minutes with patient and greater than 50  percent of visit spent counseling patient regarding treatment plan.

## 2018-06-30 ENCOUNTER — Ambulatory Visit: Payer: PRIVATE HEALTH INSURANCE | Admitting: Physician Assistant

## 2018-07-25 ENCOUNTER — Ambulatory Visit: Payer: PRIVATE HEALTH INSURANCE | Admitting: Physician Assistant

## 2018-07-26 ENCOUNTER — Other Ambulatory Visit: Payer: Self-pay

## 2018-07-26 ENCOUNTER — Ambulatory Visit (INDEPENDENT_AMBULATORY_CARE_PROVIDER_SITE_OTHER): Payer: PRIVATE HEALTH INSURANCE | Admitting: Physician Assistant

## 2018-07-26 ENCOUNTER — Encounter: Payer: Self-pay | Admitting: Physician Assistant

## 2018-07-26 VITALS — BP 128/59 | HR 93 | Temp 98.4°F | Wt 214.0 lb

## 2018-07-26 DIAGNOSIS — F330 Major depressive disorder, recurrent, mild: Secondary | ICD-10-CM | POA: Diagnosis not present

## 2018-07-26 DIAGNOSIS — E119 Type 2 diabetes mellitus without complications: Secondary | ICD-10-CM

## 2018-07-26 DIAGNOSIS — I10 Essential (primary) hypertension: Secondary | ICD-10-CM | POA: Diagnosis not present

## 2018-07-26 DIAGNOSIS — E785 Hyperlipidemia, unspecified: Secondary | ICD-10-CM

## 2018-07-26 MED ORDER — EMPAGLIFLOZIN-METFORMIN HCL 5-1000 MG PO TABS: 1.0000 | ORAL_TABLET | Freq: Two times a day (BID) | ORAL | 2 refills | Status: DC

## 2018-07-26 NOTE — Patient Instructions (Addendum)
Start synjardy.  Continue ozempic.  tresbia 10 units at bedtime.

## 2018-07-26 NOTE — Progress Notes (Signed)
Subjective:    Patient ID: Nina Trujillo, female    DOB: 1966-04-07, 53 y.o.   MRN: [id number]  HPI  Pt is a 53 yo female with uncontrolled T2DM who presents to the clinic for followup.   Pt is checking her sugars and are all over the place. She has some morning sugars in the 130's to 200's. She was not able to get ozempic approved but then [facility] and [facility] for her. She has been on it for 2 weeks. She is also taking metformin 1000mg  but not steglujan. She was unable to afford that. She confused about what to eat so she has been eating pretzels all day long. She continues to have increased thirst and urination. She admits to vision being blurred.   She has cut back on her smoking but not stopped.   .. Active Ambulatory Problems    Diagnosis Date Noted  . Tobacco dependence 05/31/2014  . Abnormal weight gain 05/31/2014  . Obese 05/31/2014  . Essential hypertension, benign 05/31/2014  . Diabetes mellitus without complication ([place]) 58/52/7782  . Menopausal symptoms 05/31/2014  . Hidradenitis suppurativa 12/13/2014  . CKD (chronic kidney disease) stage 3, GFR 30-59 ml/min (HCC) 12/16/2014  . Class 1 obesity due to excess calories with serious comorbidity and body mass index (BMI) of 34.0 to 34.9 in adult 03/21/2015  . Depression 03/21/2015  . Microalbuminuria due to type 2 diabetes mellitus ([place]) 08/25/2015  . Allergic rhinitis due to allergen 08/08/2017  . Upper back pain 05/19/2018  . Benign paroxysmal positional vertigo due to bilateral vestibular disorder 05/19/2018  . Mixed hyperlipidemia 05/22/2018  . Dyslipidemia, goal LDL below 70 05/22/2018   Resolved Ambulatory Problems    Diagnosis Date Noted  . No Resolved Ambulatory Problems   Past Medical History:  Diagnosis Date  . Hypertension      Review of Systems See HPI.     Objective:   Physical Exam Vitals signs reviewed.  Constitutional:      Appearance: Normal appearance.  HENT:     Head:  Normocephalic and atraumatic.  Cardiovascular:     Rate and Rhythm: Normal rate and regular rhythm.     Pulses: Normal pulses.  Pulmonary:     Effort: Pulmonary effort is normal.     Breath sounds: Normal breath sounds.  Neurological:     General: No focal deficit present.     Mental Status: She is alert and oriented to person, place, and time.  Psychiatric:        Mood and Affect: Mood normal.        Behavior: Behavior normal.           Assessment & Plan:  Marland KitchenMarland KitchenDiagnoses and all orders for this visit:  Essential hypertension, benign  Diabetes mellitus without complication (HCC) -     Empagliflozin-metFORMIN HCl (SYNJARDY) 09-998 MG TABS; Take 1 tablet by mouth 2 (two) times daily.  Dyslipidemia, goal LDL below 70  Mild episode of recurrent major depressive disorder (HCC) -     escitalopram (LEXAPRO) 5 MG tablet; Take 1 tablet (5 mg total) by mouth daily.  Other orders -     insulin degludec (TRESIBA FLEXTOUCH) 100 UNIT/ML SOPN FlexTouch Pen; Inject 0.1 mLs (10 Units total) into the skin at bedtime. Sample.   .. Lab Results  Component Value Date   HGBA1C 10.8 (H) 05/19/2018   Too soon to check a1c but we checked without charging to get an idea if going up  or down and was 11.3. she admits she has only been on ozempic for 2 weeks.  Continue with ozempic will likely increase to 1mg  at next visit pending she is tolerating well.  Stop metoformin XR and start synjardy. Discussed side effects.  Sample of tresbia given start with 10 units at bedtime.  Keep BS log with morning glucose checks. Goal 90-130.  Went over diabetic diet. Gave HO for GI foods and categories of what to eat and NOT to eat.   On HCTZ. On lipitor. Pt needs to be on a ACE. Pt is struggling will all the medications. Will hold for right now.   Needs refill on lexapro. Doing well.   [name].Spent 30 minutes with patient and greater than 50 percent of visit spent counseling patient regarding treatment  plan.  Follow up in 1 month.

## 2018-07-28 ENCOUNTER — Encounter: Payer: Self-pay | Admitting: Physician Assistant

## 2018-07-31 ENCOUNTER — Telehealth: Payer: Self-pay | Admitting: Physician Assistant

## 2018-07-31 MED ORDER — INSULIN DEGLUDEC 100 UNIT/ML ~~LOC~~ SOPN: 10.0000 [IU] | PEN_INJECTOR | Freq: Every day | SUBCUTANEOUS | 0 refills | Status: DC

## 2018-07-31 MED ORDER — ESCITALOPRAM OXALATE 5 MG PO TABS: 5.0000 mg | ORAL_TABLET | Freq: Every day | ORAL | 5 refills | Status: DC

## 2018-07-31 NOTE — Telephone Encounter (Signed)
Can we please follow up with patient.   Make sure she is taking ozempic weekly, synjardy daily(stopped her other metformin), tresiba 10 units at bedtime.   Make sure taking and tolerating, please.

## 2018-08-01 ENCOUNTER — Ambulatory Visit: Payer: PRIVATE HEALTH INSURANCE | Admitting: Skilled Nursing Facility1

## 2018-08-01 NOTE — Telephone Encounter (Signed)
Called pt, she states that She is taking Synjardy BID. She was previously taking metformin with this, but I advised her to stop and JUST take Synjardy, which has metformin in it. Tyler Aas is 10 units at bed time, no issues.   Pt reports that with her Ozempic, it was over $330. Requesting a cheaper alternative. I advised patient I could try to get her a coupon card to use, but pt declined. States she would just rather have a cheaper alternative. Please advise

## 2018-08-02 ENCOUNTER — Ambulatory Visit: Payer: PRIVATE HEALTH INSURANCE | Admitting: Physician Assistant

## 2018-08-02 MED ORDER — DULAGLUTIDE 0.75 MG/0.5ML ~~LOC~~ SOAJ: 0.7500 mg | SUBCUTANEOUS | 2 refills | Status: DC

## 2018-08-02 NOTE — Telephone Encounter (Signed)
I sent trulicity that is also a once weekly injectable. She may need to call pharmacy and see what GLP-1 her insurance covers.  Barnet Pall, could you help with that?

## 2018-08-03 NOTE — Telephone Encounter (Signed)
I sent the .75mg 

## 2018-08-03 NOTE — Telephone Encounter (Signed)
Would this be the 0.75 or 1.5 dose for Trulicity? Please advise.

## 2018-08-04 NOTE — Telephone Encounter (Signed)
Information has been sent to insurance to see if this will be a cheaper alternative for this patient.

## 2018-08-08 ENCOUNTER — Telehealth: Payer: Self-pay

## 2018-08-08 DIAGNOSIS — E119 Type 2 diabetes mellitus without complications: Secondary | ICD-10-CM

## 2018-08-08 NOTE — Telephone Encounter (Signed)
Left a message for a return call.

## 2018-08-08 NOTE — Telephone Encounter (Signed)
Dazia called about [place]. She states the pharmacy will not refill until the 08/10/2018. I called the pharmacy and they don't even have a prescription for the Tresiba. Is she on the Tresiba and the Trulicity? Can I send in a prescription?

## 2018-08-08 NOTE — Telephone Encounter (Signed)
Nina Trujillo was given as a sample for her to try first! I gave her 2 pens she should not need refill. Yes she should be on both.

## 2018-08-09 MED ORDER — PEN NEEDLES 31G X 6 MM MISC: 99 refills | Status: DC

## 2018-08-09 NOTE — Telephone Encounter (Signed)
Pen needles sent to the pharmacy

## 2018-08-09 NOTE — Telephone Encounter (Signed)
Patient will need education on insulin pen.

## 2018-08-16 ENCOUNTER — Ambulatory Visit: Payer: PRIVATE HEALTH INSURANCE | Admitting: Skilled Nursing Facility1

## 2018-08-17 NOTE — Telephone Encounter (Signed)
Per covermymeds the preferred product is Bydureon.

## 2018-08-18 MED ORDER — EXENATIDE ER 2 MG/0.85ML ~~LOC~~ AUIJ: 2.0000 mg | AUTO-INJECTOR | SUBCUTANEOUS | 2 refills | Status: DC

## 2018-08-18 NOTE — Addendum Note (Signed)
Addended by: Donella Stade on: 08/18/2018 09:28 AM   Modules accepted: Orders

## 2018-08-18 NOTE — Telephone Encounter (Signed)
I sent bydureon

## 2018-08-28 ENCOUNTER — Encounter: Payer: Self-pay | Admitting: Physician Assistant

## 2018-08-28 ENCOUNTER — Ambulatory Visit (INDEPENDENT_AMBULATORY_CARE_PROVIDER_SITE_OTHER): Payer: PRIVATE HEALTH INSURANCE | Admitting: Physician Assistant

## 2018-08-28 VITALS — Ht 65.0 in

## 2018-08-28 DIAGNOSIS — E1122 Type 2 diabetes mellitus with diabetic chronic kidney disease: Secondary | ICD-10-CM

## 2018-08-28 DIAGNOSIS — I10 Essential (primary) hypertension: Secondary | ICD-10-CM

## 2018-08-28 DIAGNOSIS — N183 Chronic kidney disease, stage 3 unspecified: Secondary | ICD-10-CM

## 2018-08-28 DIAGNOSIS — I129 Hypertensive chronic kidney disease with stage 1 through stage 4 chronic kidney disease, or unspecified chronic kidney disease: Secondary | ICD-10-CM

## 2018-08-28 DIAGNOSIS — E119 Type 2 diabetes mellitus without complications: Secondary | ICD-10-CM

## 2018-08-28 MED ORDER — METFORMIN HCL ER 500 MG PO TB24: 500.0000 mg | ORAL_TABLET | Freq: Every day | ORAL | 2 refills | Status: DC

## 2018-08-28 NOTE — Progress Notes (Signed)
Patient ID: Nina Trujillo, female   DOB: 09/12/1965, 53 y.o.   MRN: [id number] .Virtual Visit via Telephone Note  I connected with Nina Trujillo on 08/31/18 at  2:00 PM EDT by telephone and verified that I am speaking with the correct person using two identifiers.   I discussed the limitations, risks, security and privacy concerns of performing an evaluation and management service by telephone and the availability of in person appointments. I also discussed with the patient that there may be a patient responsible charge related to this service. The patient expressed understanding and agreed to proceed.   History of Present Illness: Pt is a 53 yo female with T2DM, HTN, CKD, HLD who has been having uncontrolled sugars and recently placed on Tresbia. She calls in to go over medications. Her morning sugars have improved and ranging 120's to 150's. She does feel better. She is taking 10 units of Tresbia at night and weekly ozempic. injections.she is still having some blurry vision but eye appt was canceled due to [place].   Her mood is pretty good. She is spending a lot of time with her grandchildren which helps.  .. Active Ambulatory Problems    Diagnosis Date Noted  . Tobacco dependence 05/31/2014  . Abnormal weight gain 05/31/2014  . Obese 05/31/2014  . Essential hypertension, benign 05/31/2014  . Diabetes mellitus without complication ([place]) 63/84/6659  . Menopausal symptoms 05/31/2014  . Hidradenitis suppurativa 12/13/2014  . CKD (chronic kidney disease) stage 3, GFR 30-59 ml/min (HCC) 12/16/2014  . Class 1 obesity due to excess calories with serious comorbidity and body mass index (BMI) of 34.0 to 34.9 in adult 03/21/2015  . Depression 03/21/2015  . Microalbuminuria due to type 2 diabetes mellitus ([place]) 08/25/2015  . Allergic rhinitis due to allergen 08/08/2017  . Upper back pain 05/19/2018  . Benign paroxysmal positional vertigo due to bilateral vestibular disorder 05/19/2018  . Mixed  hyperlipidemia 05/22/2018  . Dyslipidemia, goal LDL below 70 05/22/2018   Resolved Ambulatory Problems    Diagnosis Date Noted  . No Resolved Ambulatory Problems   Past Medical History:  Diagnosis Date  . Hypertension    Reviewed med, allergy and problem list.       Observations/Objective: No acute distress.   .. Today's Vitals   08/28/18 1347  Height: 5\' 5"  (1.651 m)   Body mass index is 35.61 kg/m.   Assessment and Plan: Marland KitchenMarland KitchenDiagnoses and all orders for this visit:  Essential hypertension, benign  Diabetes mellitus without complication (HCC) -     metFORMIN (GLUCOPHAGE XR) 500 MG 24 hr tablet; Take 1 tablet (500 mg total) by mouth daily with breakfast.  CKD (chronic kidney disease) stage 3, GFR 30-59 ml/min (HCC)   Morning sugars almost to goal. Continue on medications. Increase tresiba to 13 units nightly. I wanted to increase ozempic but [facility] and not sure she can get the 1mg  dosing right now.  Discussed diabetic diet.  Follow up in 2 months.   Follow Up Instructions:    I discussed the assessment and treatment plan with the patient. The patient was provided an opportunity to ask questions and all were answered. The patient agreed with the plan and demonstrated an understanding of the instructions.   The patient was advised to call back or seek an in-person evaluation if the symptoms worsen or if the condition fails to improve as anticipated.  I provided 7 minutes of non-face-to-face time during this encounter.   Iran Planas,  PA-C

## 2018-08-28 NOTE — Patient Instructions (Signed)
13 units of tresbia.

## 2018-08-31 ENCOUNTER — Encounter: Payer: Self-pay | Admitting: Physician Assistant

## 2018-09-20 ENCOUNTER — Telehealth: Payer: Self-pay | Admitting: Neurology

## 2018-09-20 NOTE — Telephone Encounter (Signed)
Patient left vm asking [place] to write a letter stating she is under the care of [place] Planas for her diabetes for her unemployment. Okay to write?

## 2018-09-20 NOTE — Telephone Encounter (Signed)
Ok to write? 

## 2018-09-22 NOTE — Telephone Encounter (Signed)
Written per Levada Dy and patient to pick up.

## 2018-09-26 ENCOUNTER — Telehealth: Payer: Self-pay | Admitting: Neurology

## 2018-09-26 DIAGNOSIS — I10 Essential (primary) hypertension: Secondary | ICD-10-CM

## 2018-09-26 DIAGNOSIS — F330 Major depressive disorder, recurrent, mild: Secondary | ICD-10-CM

## 2018-09-26 DIAGNOSIS — E119 Type 2 diabetes mellitus without complications: Secondary | ICD-10-CM

## 2018-09-26 DIAGNOSIS — N951 Menopausal and female climacteric states: Secondary | ICD-10-CM

## 2018-09-26 DIAGNOSIS — E782 Mixed hyperlipidemia: Secondary | ICD-10-CM

## 2018-09-26 MED ORDER — METFORMIN HCL ER 500 MG PO TB24: 500.0000 mg | ORAL_TABLET | Freq: Every day | ORAL | 2 refills | Status: DC

## 2018-09-26 MED ORDER — ESTRADIOL 2 MG PO TABS: 2.0000 mg | ORAL_TABLET | Freq: Every day | ORAL | 2 refills | Status: DC

## 2018-09-26 MED ORDER — BUPROPION HCL ER (SR) 150 MG PO TB12: 150.0000 mg | ORAL_TABLET | Freq: Two times a day (BID) | ORAL | 2 refills | Status: DC

## 2018-09-26 MED ORDER — ATORVASTATIN CALCIUM 20 MG PO TABS: 20.0000 mg | ORAL_TABLET | Freq: Every day | ORAL | 2 refills | Status: DC

## 2018-09-26 MED ORDER — HYDROCHLOROTHIAZIDE 25 MG PO TABS: 25.0000 mg | ORAL_TABLET | Freq: Every day | ORAL | 2 refills | Status: DC

## 2018-09-26 MED ORDER — ESCITALOPRAM OXALATE 5 MG PO TABS: 5.0000 mg | ORAL_TABLET | Freq: Every day | ORAL | 2 refills | Status: DC

## 2018-09-26 MED ORDER — INSULIN DEGLUDEC 100 UNIT/ML ~~LOC~~ SOPN: 13.0000 [IU] | PEN_INJECTOR | Freq: Every day | SUBCUTANEOUS | 2 refills | Status: DC

## 2018-09-26 NOTE — Telephone Encounter (Signed)
Patient left a vm requesting her medications be sent to the [facility]. I called her back to clarify what medications, she states she needs all medications to be sent to be on file with them as she can get them for free with their program. RX's sent.

## 2018-10-17 LAB — HM DIABETES EYE EXAM

## 2018-11-22 ENCOUNTER — Telehealth: Payer: Self-pay

## 2018-11-22 DIAGNOSIS — E119 Type 2 diabetes mellitus without complications: Secondary | ICD-10-CM

## 2018-11-22 MED ORDER — METFORMIN HCL ER 750 MG PO TB24: 750.0000 mg | ORAL_TABLET | Freq: Every day | ORAL | 0 refills | Status: DC

## 2018-11-22 NOTE — Telephone Encounter (Signed)
Last A1C was high in 05/2000 ,she probably needs follow up with [facility] for diabetes. In the meantime, I changed the metformin to a higher dose unaffected by the recall

## 2018-11-22 NOTE — Telephone Encounter (Signed)
Spoke to pharmacy, the new dose that was called in has been recalled also.

## 2018-11-22 NOTE — Telephone Encounter (Signed)
Nina Trujillo called and left a message stating the Metformin she was taking has been recalled. Please advise.

## 2018-11-22 NOTE — Telephone Encounter (Signed)
Called pt and advised of RX and advised that she is due for DM follow up. Patient request that I call and transfer RX to [facility]. Will transfer

## 2018-11-23 MED ORDER — METFORMIN HCL ER 500 MG PO TB24: 500.0000 mg | ORAL_TABLET | Freq: Every day | ORAL | 0 refills | Status: DC

## 2018-11-23 NOTE — Telephone Encounter (Signed)
Thanks!!!   Pt has been advised.

## 2018-11-23 NOTE — Addendum Note (Signed)
Addended by: Maryla Morrow on: 11/23/2018 04:56 PM   Modules accepted: Orders

## 2018-11-23 NOTE — Telephone Encounter (Signed)
OK that's news to me Can we contact pharmacy and see which metformin has NOT been recalled? Does the recall only affect extended release medications?

## 2018-11-23 NOTE — Telephone Encounter (Signed)
Called pt's pharmacy and they stated they were able to get some Metformin XR 500mg  in stock.. if we send RX for this they will be able to fill.Nina Trujillo

## 2018-11-23 NOTE — Telephone Encounter (Signed)
Cool, will send a prescription then for what she was originally taking... She still needs a follow-up though!

## 2018-11-23 NOTE — Telephone Encounter (Signed)
Tried to call pharmacy, on hold for 6 minutes and no one had answered. Will try again later

## 2018-12-25 ENCOUNTER — Other Ambulatory Visit: Payer: Self-pay | Admitting: Physician Assistant

## 2018-12-25 DIAGNOSIS — N951 Menopausal and female climacteric states: Secondary | ICD-10-CM

## 2018-12-25 DIAGNOSIS — F330 Major depressive disorder, recurrent, mild: Secondary | ICD-10-CM

## 2018-12-28 ENCOUNTER — Other Ambulatory Visit: Payer: Self-pay

## 2018-12-28 MED ORDER — TRESIBA FLEXTOUCH 100 UNIT/ML ~~LOC~~ SOPN: 13.0000 [IU] | PEN_INJECTOR | Freq: Every day | SUBCUTANEOUS | 2 refills | Status: DC

## 2019-01-02 ENCOUNTER — Other Ambulatory Visit: Payer: Self-pay

## 2019-01-02 ENCOUNTER — Ambulatory Visit (INDEPENDENT_AMBULATORY_CARE_PROVIDER_SITE_OTHER): Payer: BC Managed Care – PPO | Admitting: Physician Assistant

## 2019-01-02 ENCOUNTER — Encounter: Payer: Self-pay | Admitting: Physician Assistant

## 2019-01-02 ENCOUNTER — Other Ambulatory Visit: Payer: Self-pay | Admitting: Physician Assistant

## 2019-01-02 ENCOUNTER — Encounter: Payer: Self-pay | Admitting: Gastroenterology

## 2019-01-02 VITALS — BP 121/68 | HR 95 | Temp 98.3°F | Ht 67.0 in | Wt 219.0 lb

## 2019-01-02 DIAGNOSIS — E119 Type 2 diabetes mellitus without complications: Secondary | ICD-10-CM

## 2019-01-02 DIAGNOSIS — N951 Menopausal and female climacteric states: Secondary | ICD-10-CM | POA: Diagnosis not present

## 2019-01-02 DIAGNOSIS — F330 Major depressive disorder, recurrent, mild: Secondary | ICD-10-CM

## 2019-01-02 DIAGNOSIS — E6609 Other obesity due to excess calories: Secondary | ICD-10-CM

## 2019-01-02 DIAGNOSIS — E782 Mixed hyperlipidemia: Secondary | ICD-10-CM

## 2019-01-02 DIAGNOSIS — Z1231 Encounter for screening mammogram for malignant neoplasm of breast: Secondary | ICD-10-CM

## 2019-01-02 DIAGNOSIS — Z6834 Body mass index (BMI) 34.0-34.9, adult: Secondary | ICD-10-CM

## 2019-01-02 DIAGNOSIS — Z23 Encounter for immunization: Secondary | ICD-10-CM | POA: Diagnosis not present

## 2019-01-02 DIAGNOSIS — I10 Essential (primary) hypertension: Secondary | ICD-10-CM

## 2019-01-02 DIAGNOSIS — B360 Pityriasis versicolor: Secondary | ICD-10-CM

## 2019-01-02 DIAGNOSIS — Z1211 Encounter for screening for malignant neoplasm of colon: Secondary | ICD-10-CM

## 2019-01-02 LAB — POCT GLYCOSYLATED HEMOGLOBIN (HGB A1C): Hemoglobin A1C: 7 % — AB (ref 4.0–5.6)

## 2019-01-02 MED ORDER — CLOTRIMAZOLE 1 % EX CREA: 1.0000 "application " | TOPICAL_CREAM | Freq: Two times a day (BID) | CUTANEOUS | 0 refills | Status: DC

## 2019-01-02 MED ORDER — METFORMIN HCL ER 500 MG PO TB24: 500.0000 mg | ORAL_TABLET | Freq: Every day | ORAL | 0 refills | Status: DC

## 2019-01-02 MED ORDER — HYDROCHLOROTHIAZIDE 25 MG PO TABS: 25.0000 mg | ORAL_TABLET | Freq: Every day | ORAL | 1 refills | Status: DC

## 2019-01-02 MED ORDER — ESTRADIOL 2 MG PO TABS: 2.0000 mg | ORAL_TABLET | Freq: Every day | ORAL | 3 refills | Status: DC

## 2019-01-02 MED ORDER — ATORVASTATIN CALCIUM 20 MG PO TABS: 20.0000 mg | ORAL_TABLET | Freq: Every day | ORAL | 3 refills | Status: DC

## 2019-01-02 MED ORDER — BUPROPION HCL ER (SR) 150 MG PO TB12: 150.0000 mg | ORAL_TABLET | Freq: Two times a day (BID) | ORAL | 1 refills | Status: DC

## 2019-01-02 MED ORDER — TRESIBA FLEXTOUCH 100 UNIT/ML ~~LOC~~ SOPN: 13.0000 [IU] | PEN_INJECTOR | Freq: Every day | SUBCUTANEOUS | 2 refills | Status: DC

## 2019-01-02 MED ORDER — OZEMPIC (1 MG/DOSE) 2 MG/1.5ML ~~LOC~~ SOPN: 1.0000 mg | PEN_INJECTOR | SUBCUTANEOUS | 2 refills | Status: DC

## 2019-01-02 MED ORDER — ESCITALOPRAM OXALATE 5 MG PO TABS: 5.0000 mg | ORAL_TABLET | Freq: Every day | ORAL | 1 refills | Status: DC

## 2019-01-02 MED ORDER — PHENTERMINE HCL 37.5 MG PO TABS: 37.5000 mg | ORAL_TABLET | Freq: Every day | ORAL | 0 refills | Status: DC

## 2019-01-02 MED ORDER — FLUCONAZOLE 200 MG PO TABS: 200.0000 mg | ORAL_TABLET | ORAL | 0 refills | Status: DC

## 2019-01-02 NOTE — Progress Notes (Signed)
Subjective:    Patient ID: Nina Trujillo, female    DOB: 08-23-1965, 53 y.o.   MRN: [id number]  HPI  Pt is a 53 yo female with HTN, T2DM, dyslipidemia, CKD who presents to the clinic for 3 month follow up.   She is feeling "much better". She is taking her tresbia, ozempic and metformin. She often does not check sugars but when she does it is the morning and ranging about 110-130.  No open sores or wounds. No hypoglycemic events.   She does have a discoloration of skin on back and abdomen. Does not itch or hurt.   She does have some external itching of labia. No discharge.   Her mood is great. No SI/HC.    [name]. Active Ambulatory Problems    Diagnosis Date Noted  . Tobacco dependence 05/31/2014  . Abnormal weight gain 05/31/2014  . Obese 05/31/2014  . Essential hypertension, benign 05/31/2014  . Diabetes mellitus without complication ([place]) 17/49/4496  . Menopausal symptoms 05/31/2014  . Hidradenitis suppurativa 12/13/2014  . CKD (chronic kidney disease) stage 3, GFR 30-59 ml/min (HCC) 12/16/2014  . Class 1 obesity due to excess calories with serious comorbidity and body mass index (BMI) of 34.0 to 34.9 in adult 03/21/2015  . Depression 03/21/2015  . Microalbuminuria due to type 2 diabetes mellitus ([place]) 08/25/2015  . Allergic rhinitis due to allergen 08/08/2017  . Upper back pain 05/19/2018  . Benign paroxysmal positional vertigo due to bilateral vestibular disorder 05/19/2018  . Mixed hyperlipidemia 05/22/2018  . Dyslipidemia, goal LDL below 70 05/22/2018   Resolved Ambulatory Problems    Diagnosis Date Noted  . No Resolved Ambulatory Problems   Past Medical History:  Diagnosis Date  . Hypertension       Review of Systems  All other systems reviewed and are negative.      Objective:   Physical Exam Vitals signs reviewed.  Constitutional:      Appearance: Normal appearance.  HENT:     Head: Normocephalic.  Cardiovascular:     Rate and Rhythm: Normal rate and  regular rhythm.     Pulses: Normal pulses.     Heart sounds: Normal heart sounds.  Pulmonary:     Effort: Pulmonary effort is normal.  Skin:    Comments: Patches of hypopigmentation on abdomen and trunk. No scales.   Neurological:     General: No focal deficit present.     Mental Status: She is alert and oriented to person, place, and time.  Psychiatric:        Mood and Affect: Mood normal.           Assessment & Plan:  Marland KitchenMarland KitchenDemetra was seen today for diabetes.  Diagnoses and all orders for this visit:  Diabetes mellitus without complication ([place]) -     POCT glycosylated hemoglobin (Hb A1C) -     Semaglutide, 1 MG/DOSE, (OZEMPIC, 1 MG/DOSE,) 2 MG/1.5ML SOPN; Inject 1 mg into the skin once a week. -     insulin degludec (TRESIBA FLEXTOUCH) 100 UNIT/ML SOPN FlexTouch Pen; Inject 0.13 mLs (13 Units total) into the skin at bedtime. -     metFORMIN (GLUCOPHAGE-XR) 500 MG 24 hr tablet; Take 1 tablet (500 mg total) by mouth daily with breakfast.  Mixed hyperlipidemia -     atorvastatin (LIPITOR) 20 MG tablet; Take 1 tablet (20 mg total) by mouth daily.  Mild episode of recurrent major depressive disorder (HCC) -     buPROPion (WELLBUTRIN SR) 150 MG 12  hr tablet; Take 1 tablet (150 mg total) by mouth 2 (two) times daily. -     escitalopram (LEXAPRO) 5 MG tablet; Take 1 tablet (5 mg total) by mouth daily.  Menopausal symptoms -     estradiol (ESTRACE) 2 MG tablet; Take 1 tablet (2 mg total) by mouth daily.  Essential hypertension, benign -     hydrochlorothiazide (HYDRODIURIL) 25 MG tablet; Take 1 tablet (25 mg total) by mouth daily.  Colon cancer screening -     Ambulatory referral to Gastroenterology  Visit for screening mammogram -     MM 3D SCREEN BREAST BILATERAL  Class 1 obesity due to excess calories with serious comorbidity and body mass index (BMI) of 34.0 to 34.9 in adult -     phentermine (ADIPEX-P) 37.5 MG tablet; Take 1 tablet (37.5 mg total) by mouth daily before  breakfast.  Tinea versicolor -     clotrimazole (LOTRIMIN) 1 % cream; Apply 1 application topically 2 (two) times daily. -     fluconazole (DIFLUCAN) 200 MG tablet; Take 1 tablet (200 mg total) by mouth once a week. For 2 weeks for rash on trunk.  Need for influenza vaccination -     Flu Vaccine QUAD 36+ mos IM  .[name] Depression screen [facility] 2/9 01/02/2019 05/19/2018  Decreased Interest 1 3  Down, Depressed, Hopeless 1 3  PHQ - 2 Score 2 6  Altered sleeping 1 3  Tired, decreased energy 2 2  Change in appetite 2 2  Feeling bad or failure about yourself  1 3  Trouble concentrating 1 2  Moving slowly or fidgety/restless 0 0  Suicidal thoughts 0 0  PHQ-9 Score 9 18  Difficult doing work/chores Not difficult at all Very difficult   .[name] GAD 7 : Generalized Anxiety Score 01/02/2019 05/19/2018  Nervous, Anxious, on Edge 1 2  Control/stop worrying 0 2  Worry too much - different things 0 2  Trouble relaxing 0 3  Restless 0 2  Easily annoyed or irritable 0 2  Afraid - awful might happen 0 2  Total GAD 7 Score 1 15  Anxiety Difficulty Not difficult at all Not difficult at all     .[name] Lab Results  Component Value Date   HGBA1C 7.0 (A) 01/02/2019    Sugar is much better.  Increased ozempic to 1mg . Goal is to be able to decrease tresbia.  On statin.  BP controlled.  Follow up in 3 months.   Flu shot given today.   Treated for tinea versicolor.   For external labia itching. Could be some yeast. dilfucan could help. If continues come back for dedicated exam. Consider oatmeal baths.   Refilled medications.   Marland KitchenMarland Kitchen

## 2019-01-02 NOTE — Patient Instructions (Signed)
Tinea Versicolor  Tinea versicolor is a skin infection. It is caused by a type of yeast. It is normal for some yeast to be on your skin, but too much yeast causes this infection. The infection causes a rash of light or dark patches on your skin. The rash is most common on the chest, back, neck, or upper arms. The infection usually does not cause other problems. If it is treated, it will probably go away in a few weeks. The infection cannot be spread from one person to another (is not contagious). Follow these instructions at home:  Use over-the-counter and prescription medicines only as told by your doctor.  Scrub your skin every day with dandruff shampoo as told by your doctor.  Do not scratch your skin in the rash area.  Avoid places that are hot and humid.  Do not use tanning booths.  Try to avoid sweating a lot. Contact a doctor if:  Your symptoms get worse.  You have a fever.  You have redness, swelling, or pain in the rash area.  You have fluid or blood coming from your rash.  Your rash feels warm to the touch.  You have pus or a bad smell coming from your rash.  Your rash comes back (recurs) after treatment. Summary  Tinea versicolor is a skin infection. It causes a rash of light or dark patches on your skin.  The rash is most common on the chest, back, neck, or upper arms. This infection usually does not cause other problems.  Use over-the-counter and prescription medicines only as told by your doctor.  If the infection is treated, it will probably go away in a few weeks. This information is not intended to replace advice given to you by your health care provider. Make sure you discuss any questions you have with your health care provider. Document Released: 04/15/2008 Document Revised: 04/15/2017 Document Reviewed: 01/03/2017 Elsevier Patient Education  2020 [employer].

## 2019-01-12 ENCOUNTER — Ambulatory Visit: Payer: Self-pay

## 2019-01-24 ENCOUNTER — Telehealth: Payer: Self-pay | Admitting: *Deleted

## 2019-01-24 NOTE — Telephone Encounter (Signed)
Pre-visit no show.  Left message to call by 5pm to reschedule to avoid cancellation of colonoscopy.

## 2019-01-31 ENCOUNTER — Ambulatory Visit (INDEPENDENT_AMBULATORY_CARE_PROVIDER_SITE_OTHER): Payer: BC Managed Care – PPO

## 2019-01-31 ENCOUNTER — Other Ambulatory Visit: Payer: Self-pay

## 2019-01-31 DIAGNOSIS — Z1231 Encounter for screening mammogram for malignant neoplasm of breast: Secondary | ICD-10-CM | POA: Diagnosis not present

## 2019-01-31 IMAGING — MG MM DIGITAL SCREENING BILAT W/ TOMO W/ CAD
8 series · 8 of 24 positions shown · non-contrast
Comparison: None.

CLINICAL DATA: Screening.

EXAM:
DIGITAL SCREENING BILATERAL MAMMOGRAM WITH TOMO AND CAD

[R CC synth-2D]
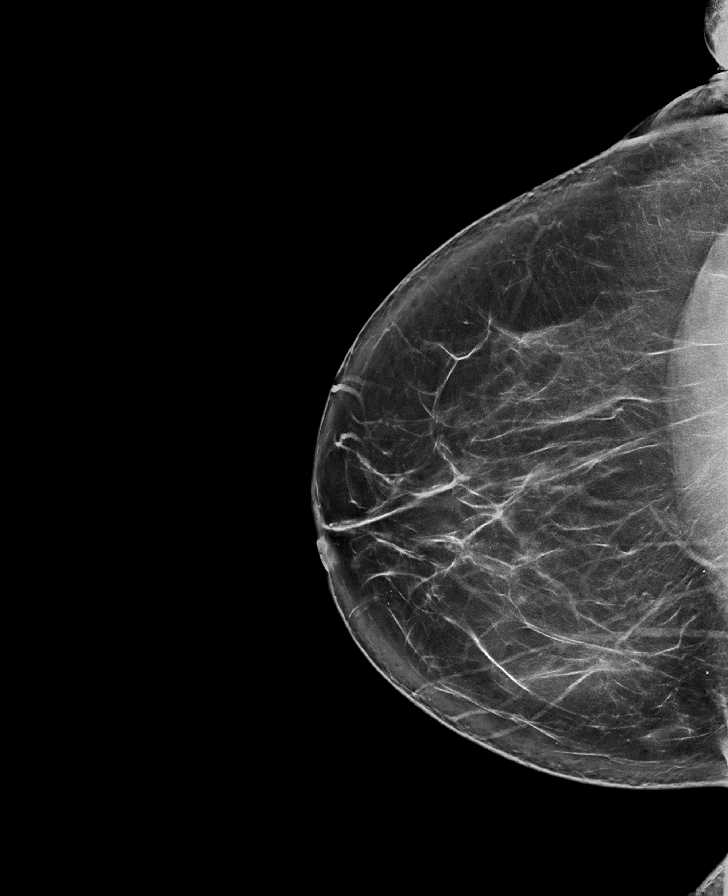

[R MLO synth-2D]
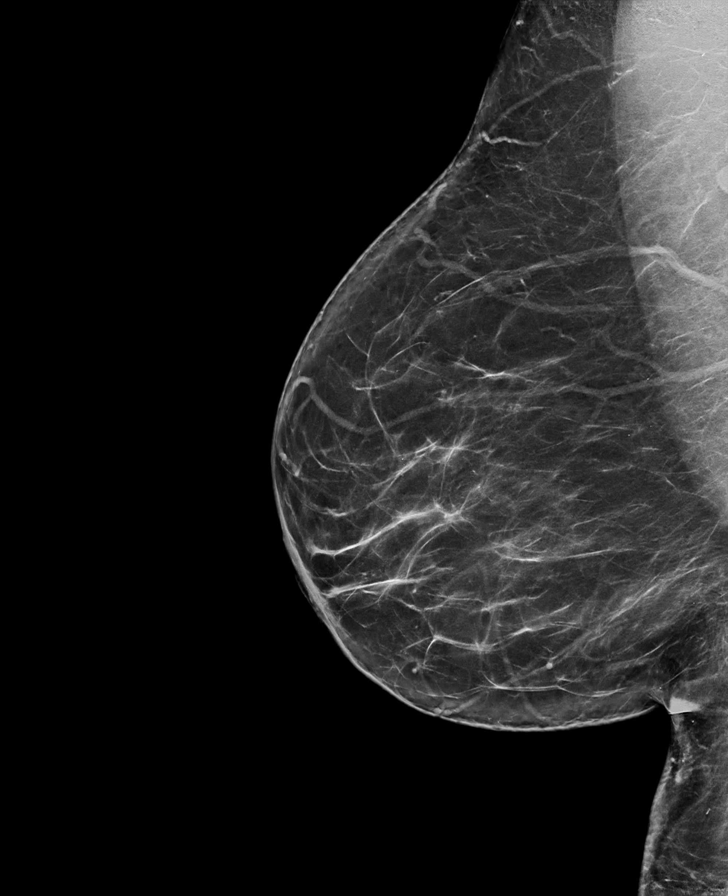

[L MLO synth-2D]
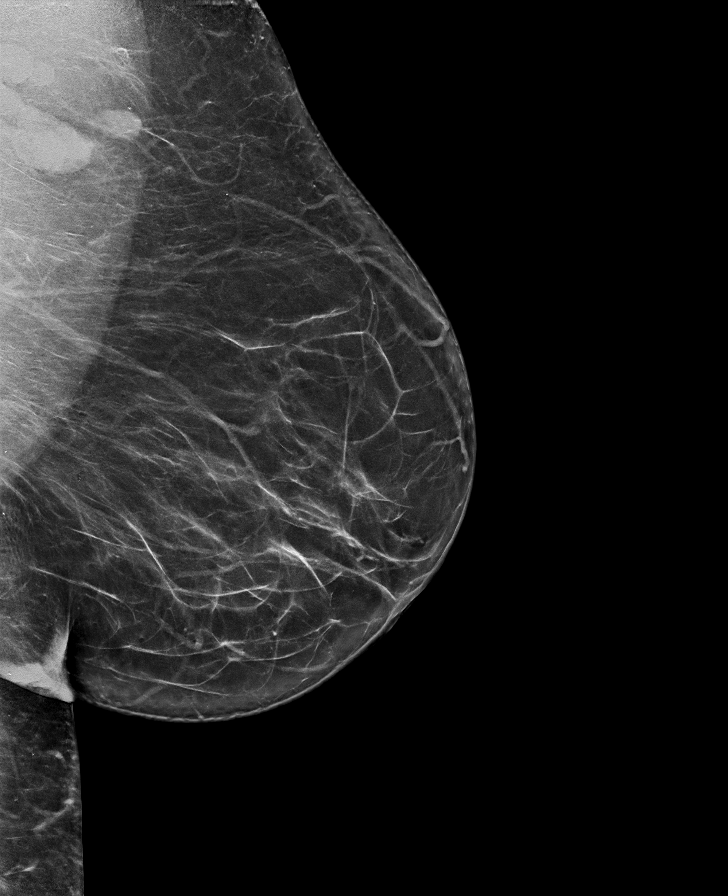

[L CC synth-2D]
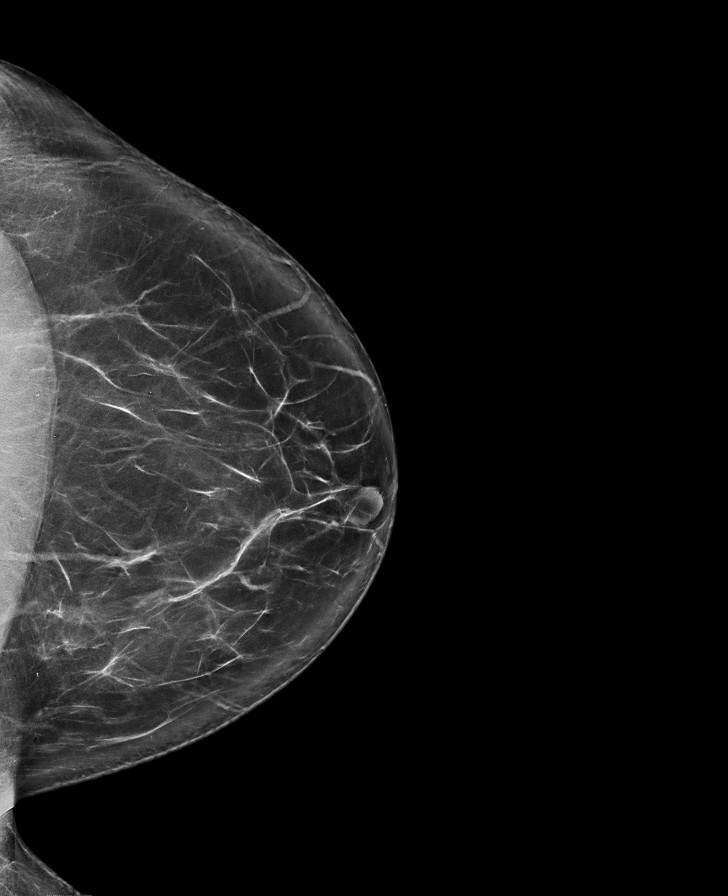

[L MLO tomo · tomo slice 42/83.0]
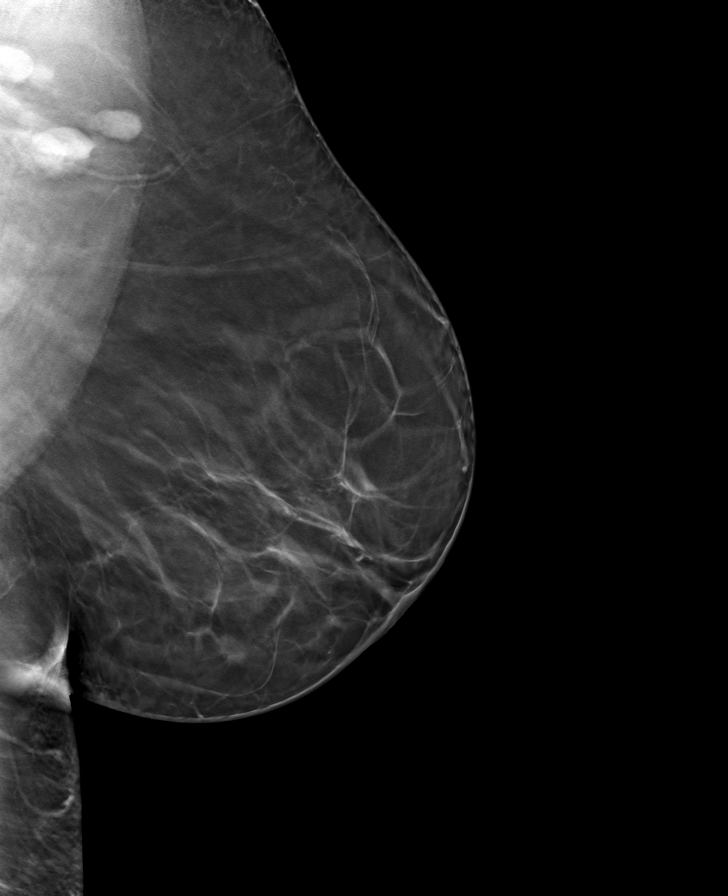

[L CC tomo · tomo slice 46/91.0]
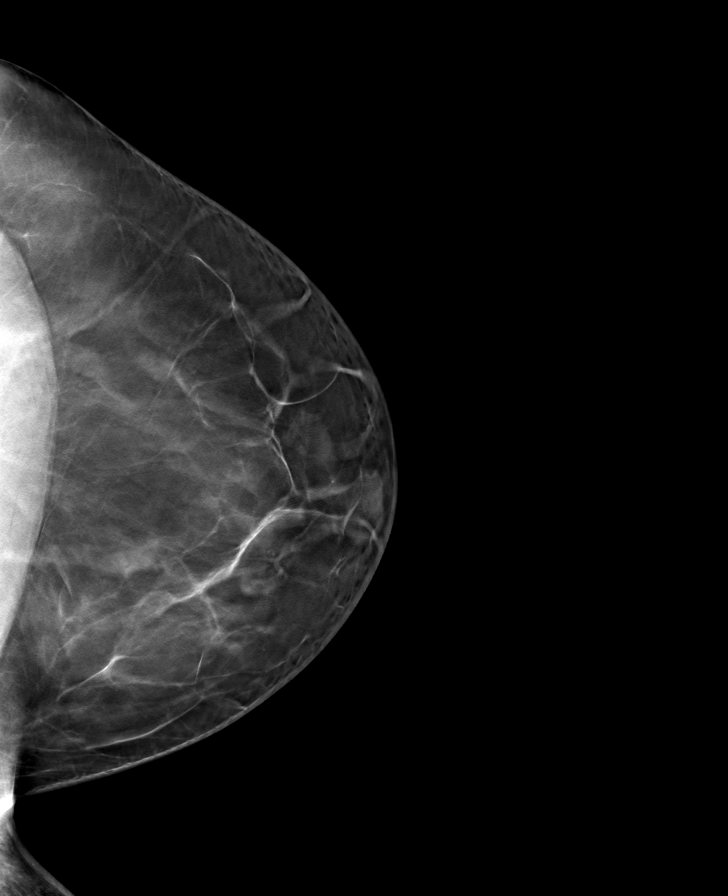

[R CC tomo · tomo slice 43/85.0]
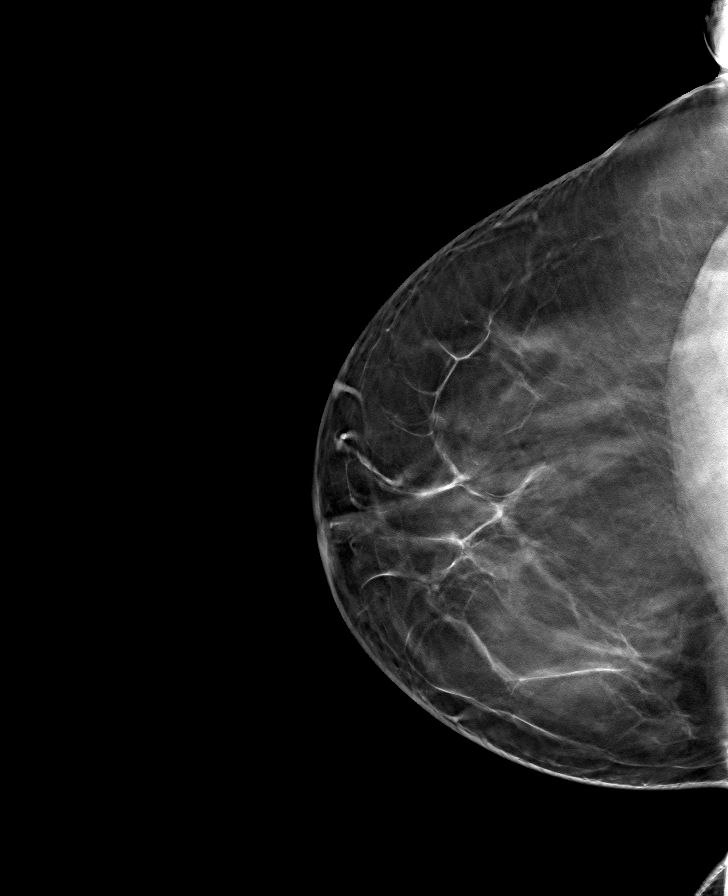

[R MLO tomo · tomo slice 43/84.0]
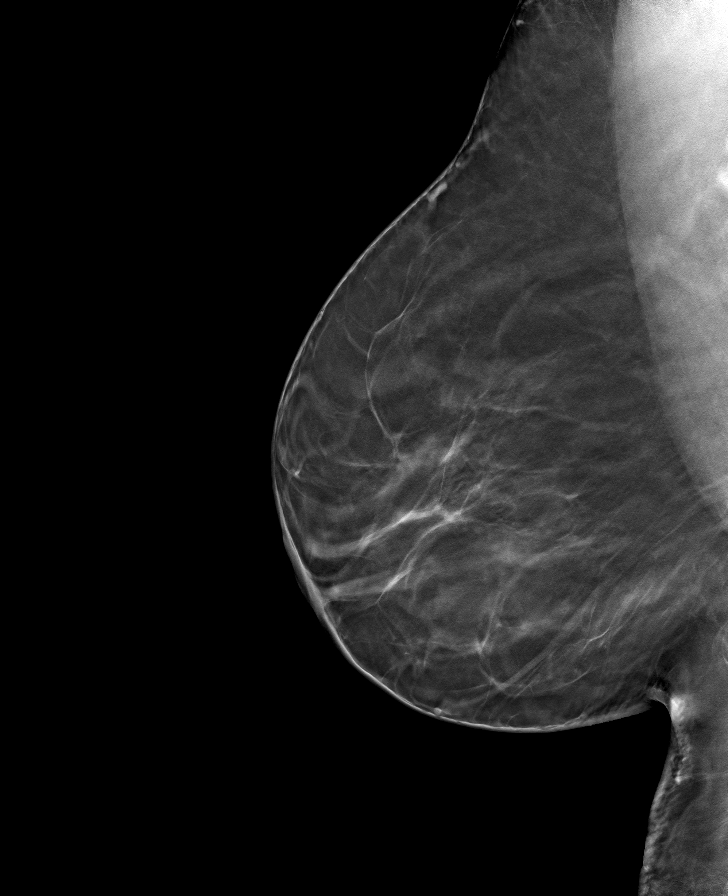

[8 of 24 positions shown; findings below may reference images not displayed]

ACR Breast Density Category b: There are scattered areas of
fibroglandular density.
FINDINGS: In the left axilla, a possible mass warrants further evaluation. In
the right breast, no findings suspicious for malignancy.

Images were processed with CAD.
IMPRESSION: Further evaluation is suggested for possible mass in the left
axilla.

RECOMMENDATION:
Ultrasound of the left axilla. (Code:[8I])

The patient will be contacted regarding the findings, and additional
imaging will be scheduled.

BI-RADS CATEGORY  0: Incomplete. Need additional imaging evaluation
and/or prior mammograms for comparison.

## 2019-02-01 NOTE — Progress Notes (Signed)
Possible mass in left armpit. Make sure patient is scheduled for u/s.

## 2019-02-02 ENCOUNTER — Other Ambulatory Visit: Payer: Self-pay | Admitting: Physician Assistant

## 2019-02-02 DIAGNOSIS — R928 Other abnormal and inconclusive findings on diagnostic imaging of breast: Secondary | ICD-10-CM

## 2019-02-07 ENCOUNTER — Encounter: Payer: BC Managed Care – PPO | Admitting: Gastroenterology

## 2019-02-07 ENCOUNTER — Other Ambulatory Visit: Payer: Self-pay

## 2019-02-07 ENCOUNTER — Ambulatory Visit
Admission: RE | Admit: 2019-02-07 | Discharge: 2019-02-07 | Disposition: A | Discharge status: Home / Self Care | Payer: BC Managed Care – PPO | Source: Ambulatory Visit | Attending: Physician Assistant | Admitting: Physician Assistant

## 2019-02-07 DIAGNOSIS — R928 Other abnormal and inconclusive findings on diagnostic imaging of breast: Secondary | ICD-10-CM | POA: Diagnosis not present

## 2019-02-07 DIAGNOSIS — R590 Localized enlarged lymph nodes: Secondary | ICD-10-CM | POA: Diagnosis not present

## 2019-02-07 IMAGING — US US AXILLARY LEFT
1 series · 10 of 10 positions shown · non-contrast
Comparison: Screening mammogram dated [DATE].
COMPARISON: Screening mammogram dated [DATE].
COMPARISON: Screening mammogram dated [DATE].

Addendum:
CLINICAL DATA: Patient was called back from screening mammogram for
possible left axillary adenopathy.

EXAM:
ULTRASOUND OF THE LEFT AXILLA

[Series 1: us axillary left · 0.07mm/px · 10 of 10 slices shown]
[im 1/10]
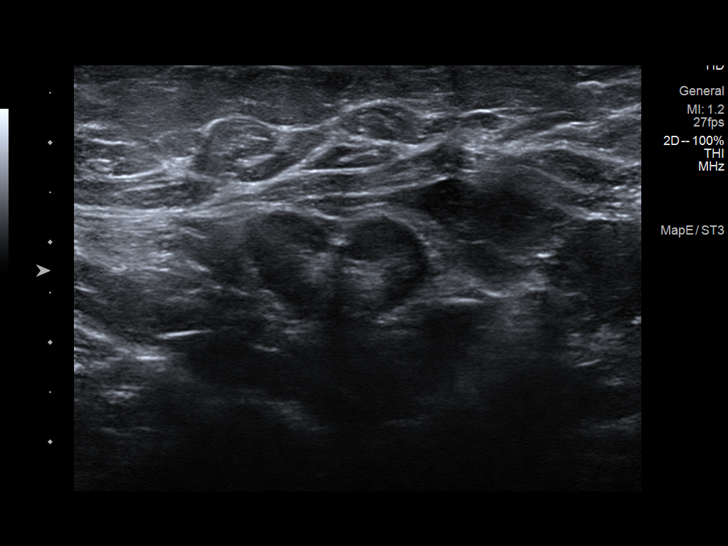
[im 2/10]
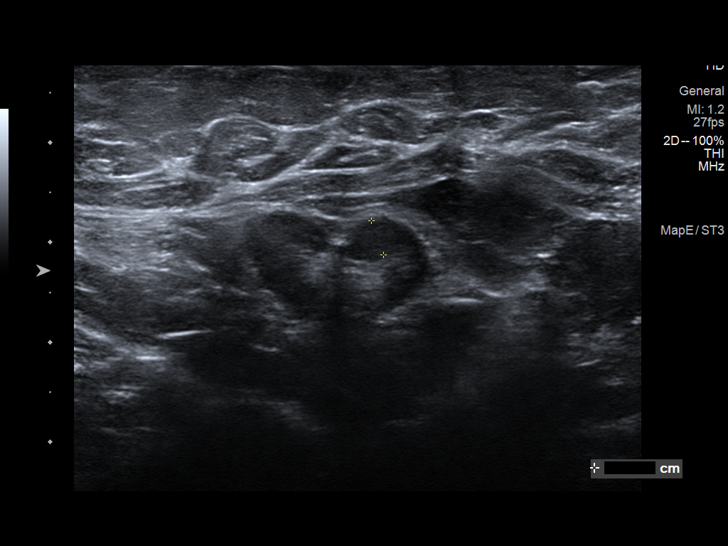
[im 3/10]
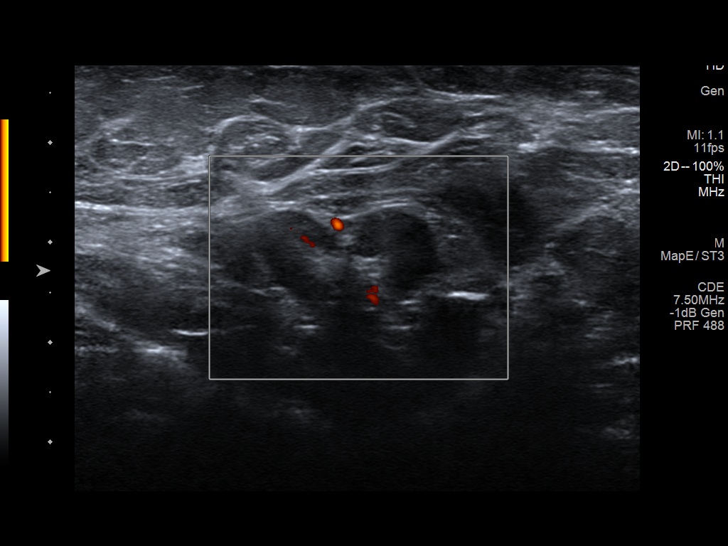
[im 4/10]
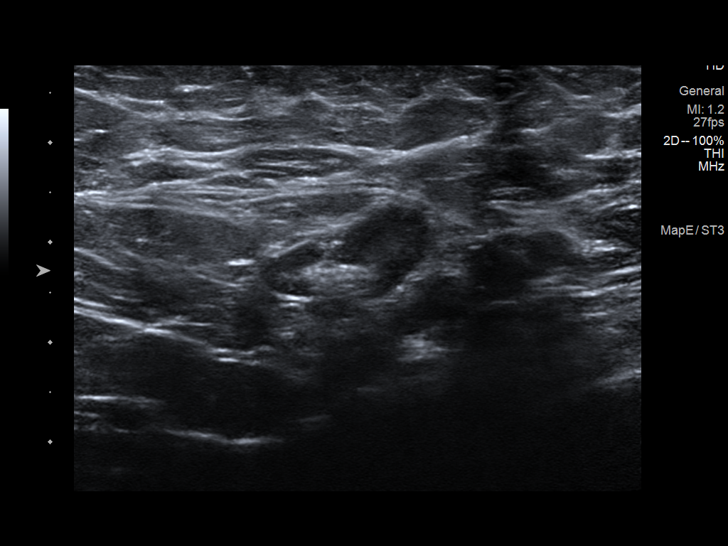
[im 5/10]
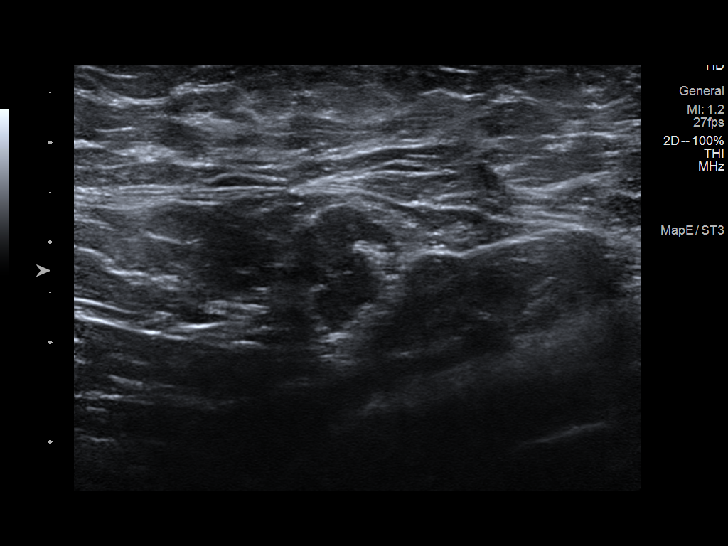
[im 6/10]
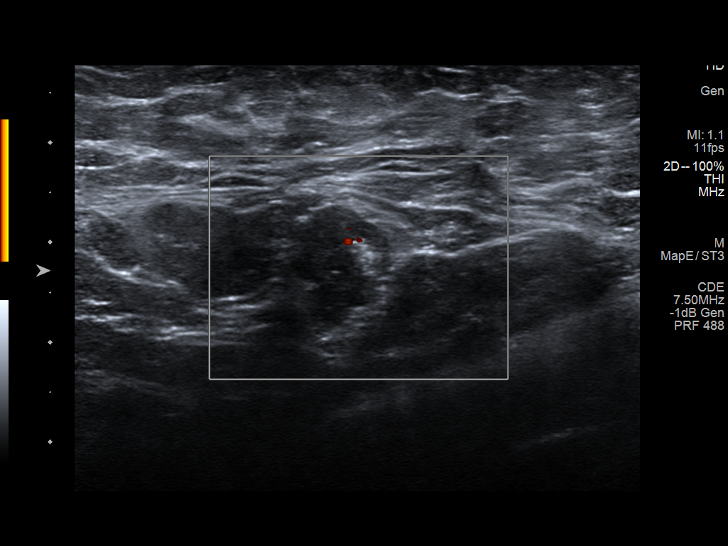
[im 7/10]
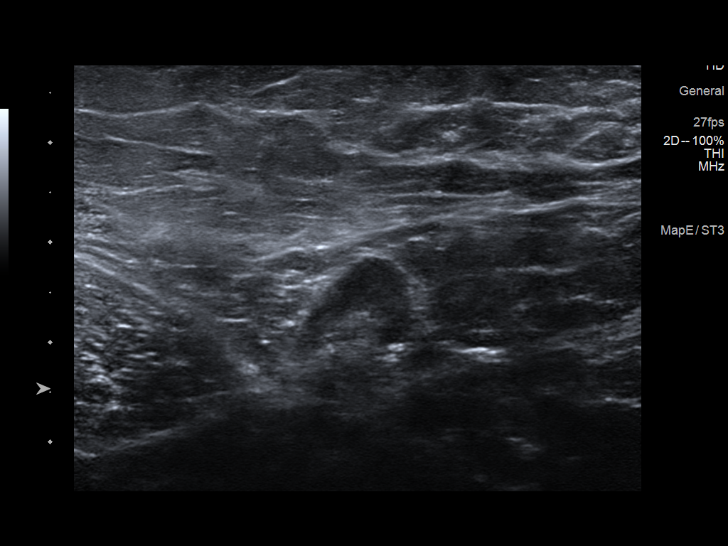
[im 8/10]
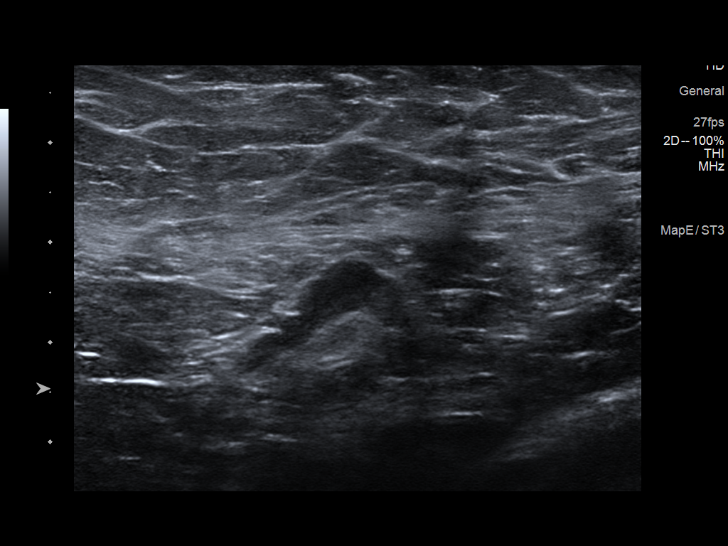
[im 9/10]
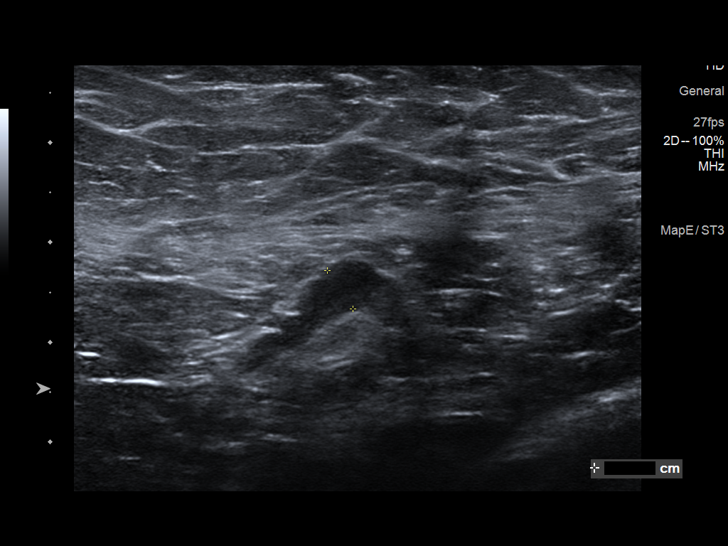
[im 10/10]
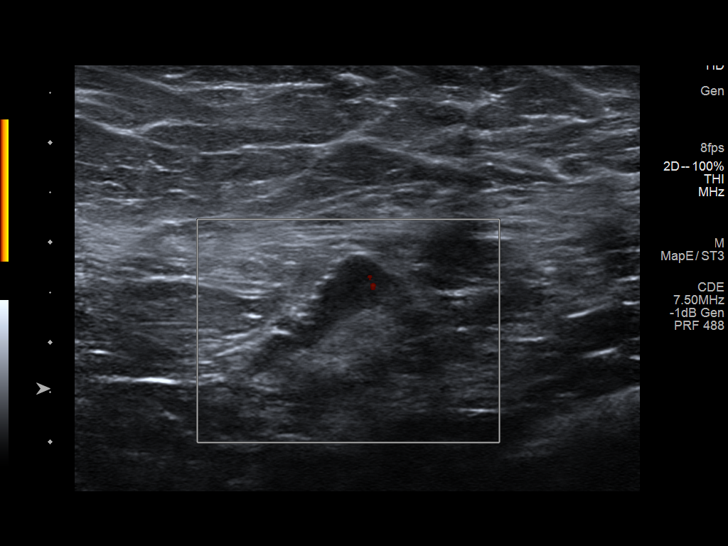

[10 of 10 positions shown; findings below may reference images not displayed]

FINDINGS: Ultrasound is performed, showing at least 3 lymph nodes in the left
axilla with focal cortical thickening measuring up to 5 mm.
IMPRESSION: Indeterminate left axillary adenopathy.

RECOMMENDATION:
Indeterminate left axillary adenopathy. We will try to obtain the
prior mammograms for comparison. If the mammograms are not available
for comparison or the axillary adenopathy is not identified on the
prior exams ultrasound-guided core biopsy of the left axillary lymph
nodes is recommended.

I have discussed the findings and recommendations with the patient.
If applicable, a reminder letter will be sent to the patient
regarding the next appointment.

BI-RADS CATEGORY  4: Suspicious.

ADDENDUM:
Patient's prior mammogram dated [DATE] from [REDACTED] are
available for comparison. The lymph nodes in the left axilla have a
similar appearance to the prior exam and are felt to likely be
benign.
IMPRESSION: Probable benign left axillary lymph nodes.

Recommendation: Short-term interval follow-up left axillary
ultrasound in 6 months is recommended. I spoke with the patient by
telephone and she was made aware of the findings.

ADDENDUM:
BI-RADS CATEGORY:

3: Probably benign.

*** End of Addendum ***
Addendum:
FINDINGS: Ultrasound is performed, showing at least 3 lymph nodes in the left
axilla with focal cortical thickening measuring up to 5 mm.
IMPRESSION: Indeterminate left axillary adenopathy.

RECOMMENDATION:
Indeterminate left axillary adenopathy. We will try to obtain the
prior mammograms for comparison. If the mammograms are not available
for comparison or the axillary adenopathy is not identified on the
prior exams ultrasound-guided core biopsy of the left axillary lymph
nodes is recommended.

I have discussed the findings and recommendations with the patient.
If applicable, a reminder letter will be sent to the patient
regarding the next appointment.

BI-RADS CATEGORY  4: Suspicious.

ADDENDUM:
Patient's prior mammogram dated [DATE] from [REDACTED] are
available for comparison. The lymph nodes in the left axilla have a
similar appearance to the prior exam and are felt to likely be
benign.
IMPRESSION: Probable benign left axillary lymph nodes.

Recommendation: Short-term interval follow-up left axillary
ultrasound in 6 months is recommended. I spoke with the patient by
telephone and she was made aware of the findings.

*** End of Addendum ***
FINDINGS: Ultrasound is performed, showing at least 3 lymph nodes in the left
axilla with focal cortical thickening measuring up to 5 mm.
IMPRESSION: Indeterminate left axillary adenopathy.

RECOMMENDATION:
Indeterminate left axillary adenopathy. We will try to obtain the
prior mammograms for comparison. If the mammograms are not available
for comparison or the axillary adenopathy is not identified on the
prior exams ultrasound-guided core biopsy of the left axillary lymph
nodes is recommended.

I have discussed the findings and recommendations with the patient.
If applicable, a reminder letter will be sent to the patient
regarding the next appointment.

BI-RADS CATEGORY  4: Suspicious.

## 2019-02-13 ENCOUNTER — Ambulatory Visit (INDEPENDENT_AMBULATORY_CARE_PROVIDER_SITE_OTHER): Payer: BC Managed Care – PPO

## 2019-02-13 ENCOUNTER — Other Ambulatory Visit: Payer: Self-pay

## 2019-02-13 ENCOUNTER — Ambulatory Visit (INDEPENDENT_AMBULATORY_CARE_PROVIDER_SITE_OTHER): Payer: BC Managed Care – PPO | Admitting: Physician Assistant

## 2019-02-13 VITALS — BP 115/73 | HR 97 | Ht 66.0 in | Wt 218.0 lb

## 2019-02-13 DIAGNOSIS — R222 Localized swelling, mass and lump, trunk: Secondary | ICD-10-CM | POA: Diagnosis not present

## 2019-02-13 DIAGNOSIS — F17200 Nicotine dependence, unspecified, uncomplicated: Secondary | ICD-10-CM

## 2019-02-13 DIAGNOSIS — M25511 Pain in right shoulder: Secondary | ICD-10-CM | POA: Diagnosis not present

## 2019-02-13 IMAGING — DX DG CHEST 2V
2 series · 2 of 2 positions shown · non-contrast
Comparison: None.

CLINICAL DATA: Smoker, fullness above clavicle, right clavicle pain
for 1 month.

EXAM:
CHEST - 2 VIEW; RIGHT CLAVICLE - 2+ VIEWS

[chest pa]
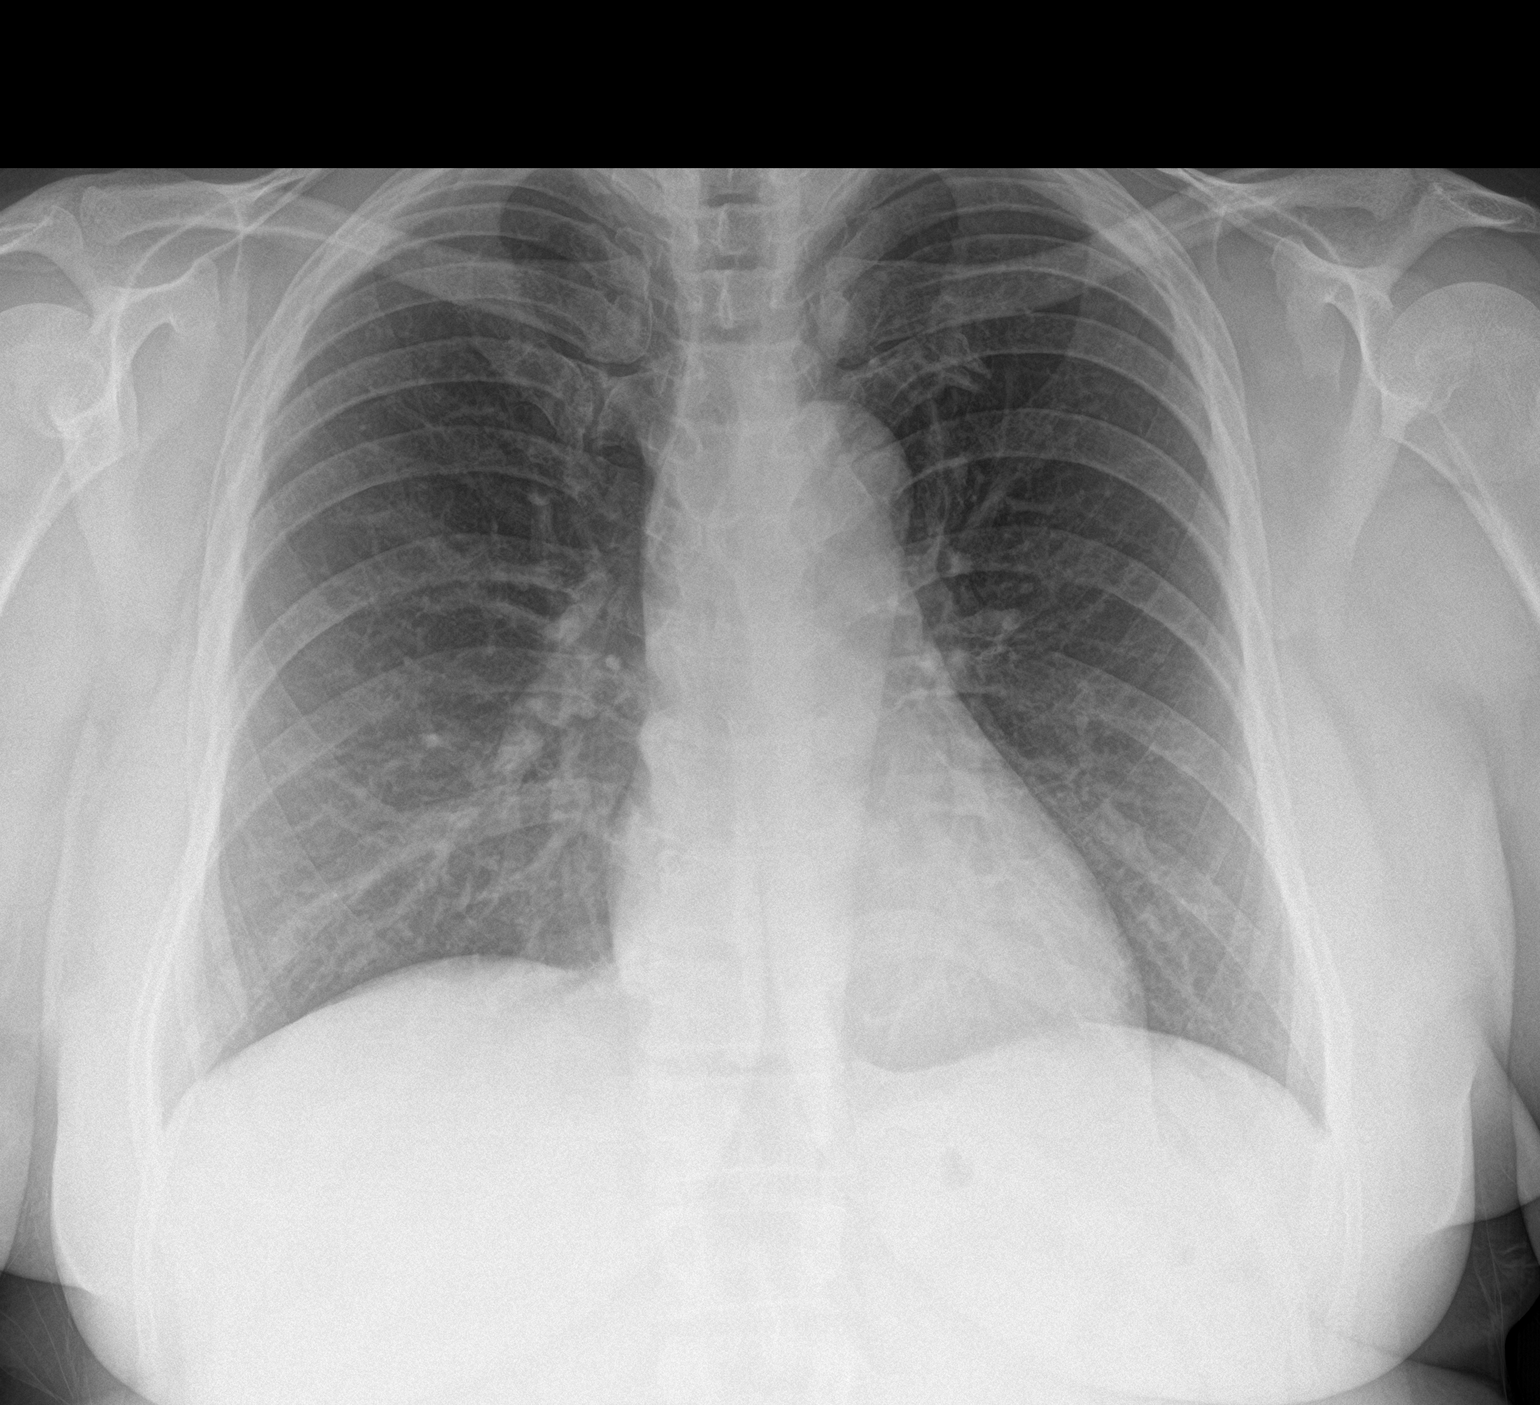

[chest lat]
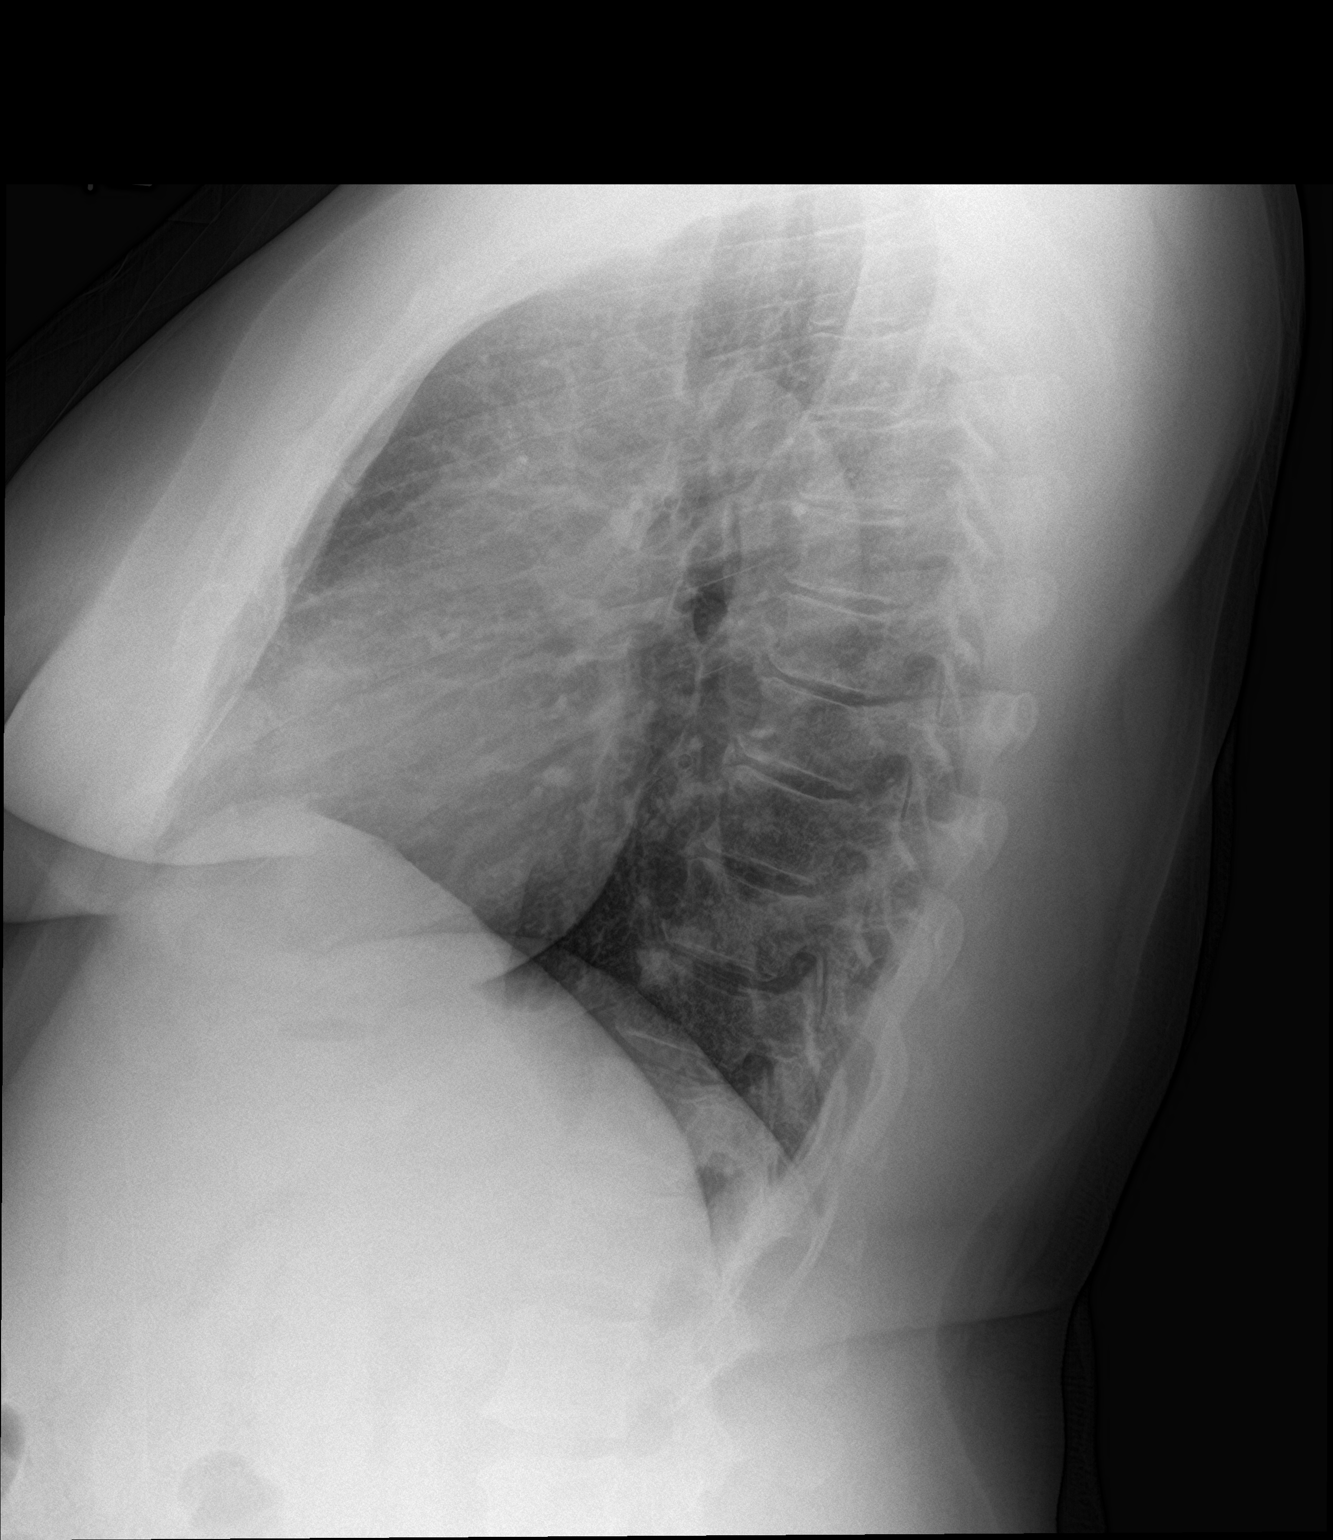

[2 of 2 positions shown; findings below may reference images not displayed]

FINDINGS: Heart size is normal. Pulmonary vasculature unremarkable. Lungs are
clear.

No acute bone finding on radiographs of the chest. No signs of
fracture or acute finding noted on dedicated clavicular views.
IMPRESSION: No acute cardiopulmonary disease.

No signs of clavicular abnormality.

## 2019-02-13 IMAGING — DX DG CLAVICLE*R*
2 series · 2 of 2 positions shown · non-contrast
Comparison: None.

CLINICAL DATA: Smoker, fullness above clavicle, right clavicle pain
for 1 month.

EXAM:
CHEST - 2 VIEW; RIGHT CLAVICLE - 2+ VIEWS

[clavicle ap]
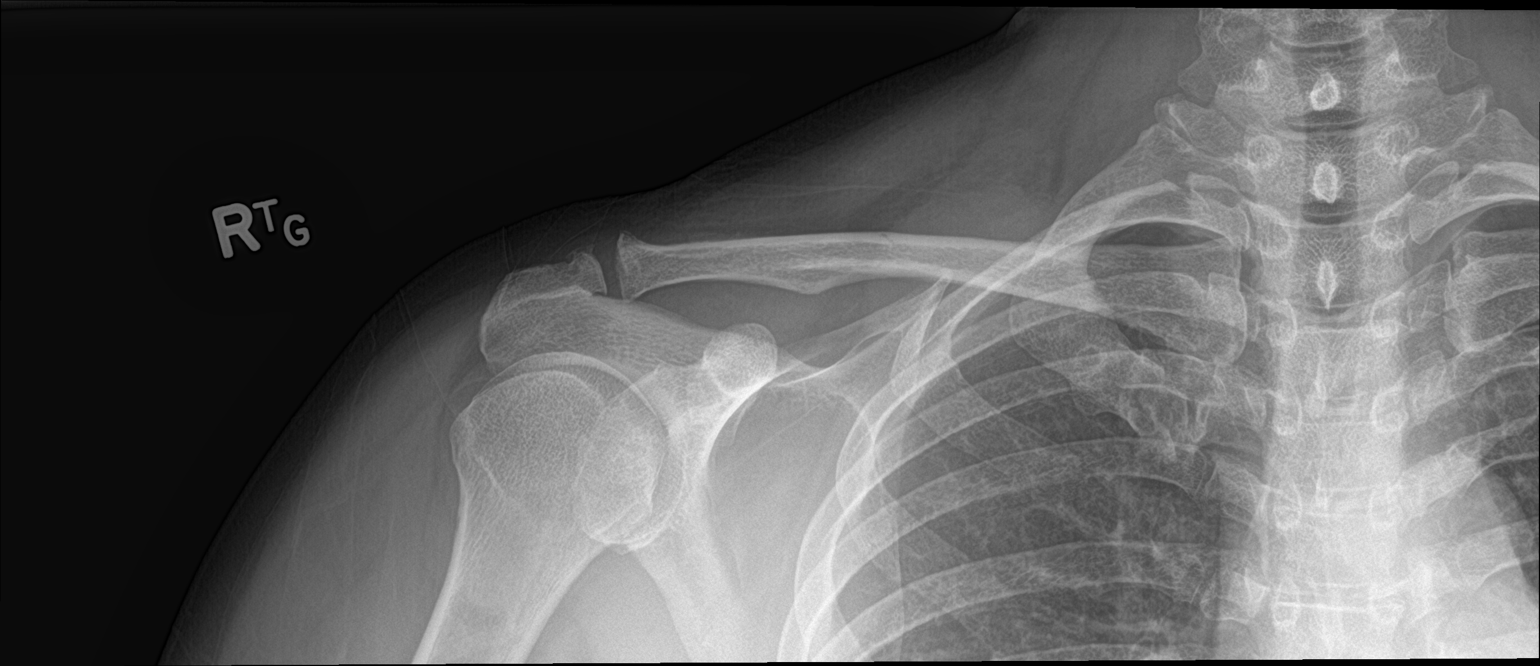

[clavicle axial]
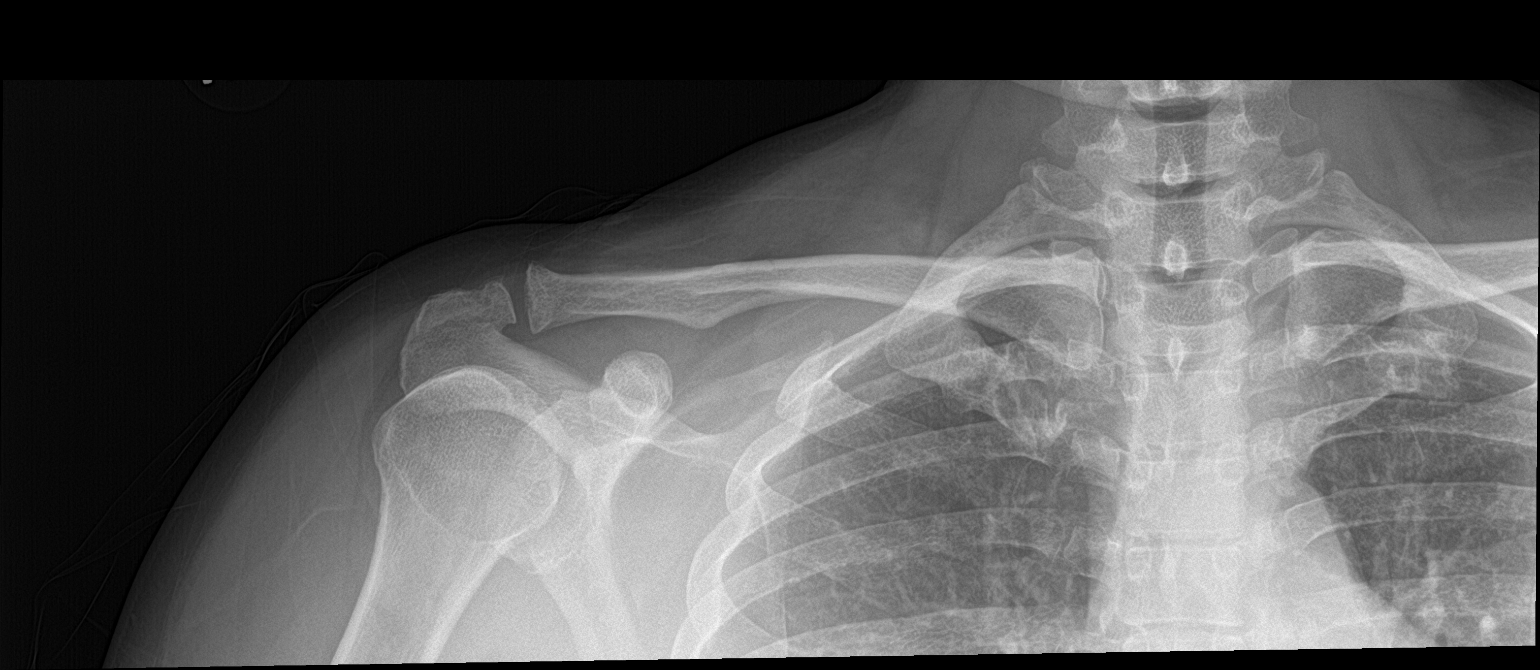

[2 of 2 positions shown; findings below may reference images not displayed]

FINDINGS: Heart size is normal. Pulmonary vasculature unremarkable. Lungs are
clear.

No acute bone finding on radiographs of the chest. No signs of
fracture or acute finding noted on dedicated clavicular views.
IMPRESSION: No acute cardiopulmonary disease.

No signs of clavicular abnormality.

## 2019-02-13 NOTE — Progress Notes (Signed)
CXR normal.

## 2019-02-13 NOTE — Progress Notes (Signed)
   Subjective:    Patient ID: Nina Trujillo, female    DOB: 08-31-1965, 53 y.o.   MRN: [id number]  HPI  Pt is a 53 yo female who presents to the clinic with fullness of her right supraclavicular area for last month. A friend noticed it first. She denies any trauma, recent fall or injury. Pt is a smoker but no SOB. Pt denies any problems swallowing, cough or pain. Her neck is tight and stiff at times. She feels like the fullness is getting bigger. She recently had mammogram that was abnormal and had to get a [place] of axilla that showed indeterminate left axillary adenopathy. This worries patient. Her mother died of BC. No family hx of lymphoma or leukemia. No fever, chills, night sweats, myalgia out of the ordinary.   .. Active Ambulatory Problems    Diagnosis Date Noted  . Tobacco dependence 05/31/2014  . Abnormal weight gain 05/31/2014  . Obese 05/31/2014  . Essential hypertension, benign 05/31/2014  . Diabetes mellitus without complication ([place]) [id number]  . Menopausal symptoms 05/31/2014  . Hidradenitis suppurativa 12/13/2014  . CKD (chronic kidney disease) stage 3, GFR 30-59 ml/min (HCC) 12/16/2014  . Class 1 obesity due to excess calories with serious comorbidity and body mass index (BMI) of 34.0 to 34.9 in adult 03/21/2015  . Depression 03/21/2015  . Microalbuminuria due to type 2 diabetes mellitus ([place]) 08/25/2015  . Allergic rhinitis due to allergen 08/08/2017  . Upper back pain 05/19/2018  . Benign paroxysmal positional vertigo due to bilateral vestibular disorder 05/19/2018  . Mixed hyperlipidemia 05/22/2018  . Dyslipidemia, goal LDL below 70 05/22/2018   Resolved Ambulatory Problems    Diagnosis Date Noted  . No Resolved Ambulatory Problems   Past Medical History:  Diagnosis Date  . Hypertension      Review of Systems  All other systems reviewed and are negative.      Objective:   Physical Exam Vitals signs reviewed.  Constitutional:      Appearance:  Normal appearance. She is obese.  Neck:     Musculoskeletal: No muscular tenderness.     Comments: Noticeable fullness of right supraclavicular space and seemly more prominent right clavicle.  Cardiovascular:     Rate and Rhythm: Normal rate.     Pulses: Normal pulses.  Pulmonary:     Effort: Pulmonary effort is normal.  Musculoskeletal: Normal range of motion.  Lymphadenopathy:     Cervical: No cervical adenopathy.  Skin:    General: Skin is warm.  Neurological:     General: No focal deficit present.     Mental Status: She is alert and oriented to person, place, and time.  Psychiatric:        Mood and Affect: Mood normal.           Assessment & Plan:  Marland KitchenMarland KitchenShelli was seen today for mass.  Diagnoses and all orders for this visit:  Supraclavicular fossa fullness -     CBC with Differential/Platelet -     US SOFT TISSUE HEAD & NECK (NON-THYROID) -     DG Chest 2 View -     DG Clavicle Right  Current smoker   CBC/US/CXR/XR clavicle.  Will follow up with results.   STRONGLY encouraged smoking cessation. Pt is aware she needs to stop she is going to think about how. Discussed wellbutrin/chantix.

## 2019-02-13 NOTE — Progress Notes (Signed)
clavicle normal structurally.

## 2019-02-14 ENCOUNTER — Other Ambulatory Visit: Payer: BC Managed Care – PPO

## 2019-02-15 ENCOUNTER — Other Ambulatory Visit: Payer: Self-pay

## 2019-02-15 ENCOUNTER — Ambulatory Visit (INDEPENDENT_AMBULATORY_CARE_PROVIDER_SITE_OTHER): Payer: BC Managed Care – PPO

## 2019-02-15 DIAGNOSIS — R222 Localized swelling, mass and lump, trunk: Secondary | ICD-10-CM

## 2019-02-15 DIAGNOSIS — I129 Hypertensive chronic kidney disease with stage 1 through stage 4 chronic kidney disease, or unspecified chronic kidney disease: Secondary | ICD-10-CM | POA: Diagnosis not present

## 2019-02-15 DIAGNOSIS — E1121 Type 2 diabetes mellitus with diabetic nephropathy: Secondary | ICD-10-CM | POA: Diagnosis not present

## 2019-02-15 DIAGNOSIS — R590 Localized enlarged lymph nodes: Secondary | ICD-10-CM | POA: Diagnosis not present

## 2019-02-15 DIAGNOSIS — N1831 Chronic kidney disease, stage 3a: Secondary | ICD-10-CM | POA: Diagnosis not present

## 2019-02-15 DIAGNOSIS — R591 Generalized enlarged lymph nodes: Secondary | ICD-10-CM | POA: Diagnosis not present

## 2019-02-15 DIAGNOSIS — E1122 Type 2 diabetes mellitus with diabetic chronic kidney disease: Secondary | ICD-10-CM | POA: Diagnosis not present

## 2019-02-15 DIAGNOSIS — R221 Localized swelling, mass and lump, neck: Secondary | ICD-10-CM | POA: Diagnosis not present

## 2019-02-15 IMAGING — US US SOFT TISSUE HEAD/NECK
1 series · 14 of 25 positions shown · non-contrast
Comparison: None.

CLINICAL DATA: 53-year-old female with a history of neck mass

EXAM:
ULTRASOUND OF HEAD/NECK SOFT TISSUES
TECHNIQUE: Ultrasound examination of the head and neck soft tissues was
performed in the area of clinical concern.

[Series 1: us soft tissue head/neck · 0.06mm/px · 14 of 39 slices shown]
[im 1/39]
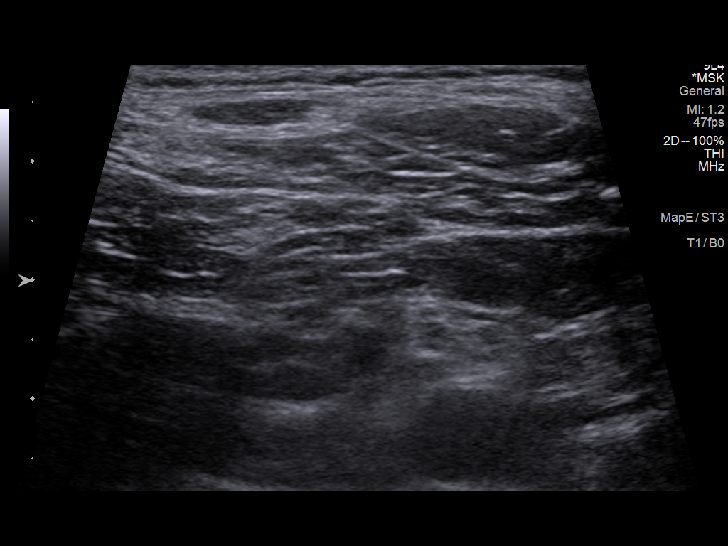
[im 4/39]
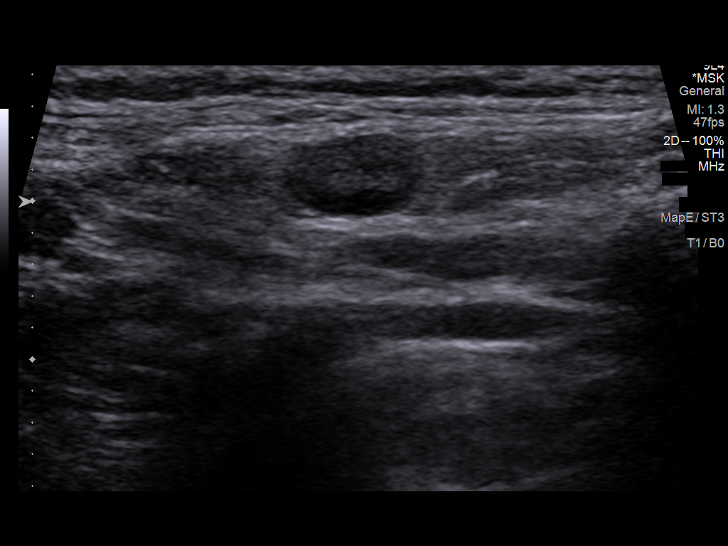
[im 7/39]
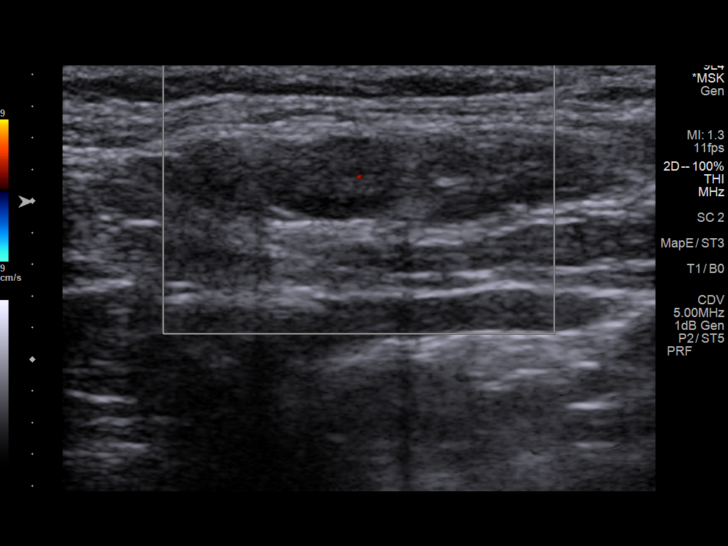
[im 10/39]
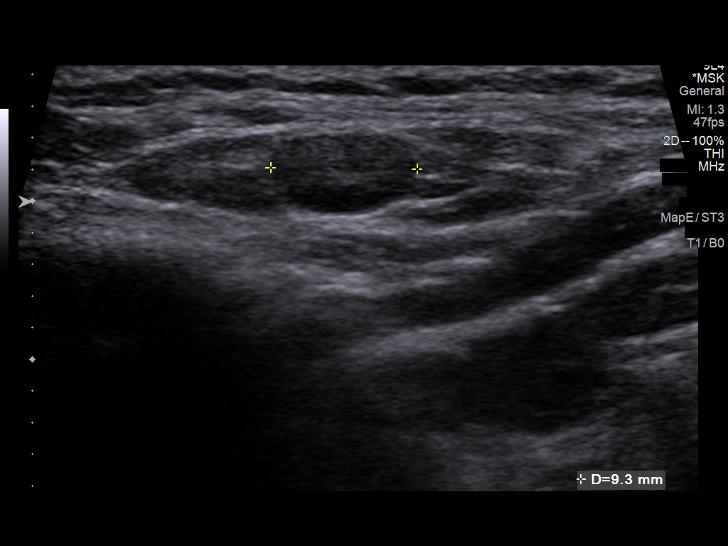
[im 13/39]
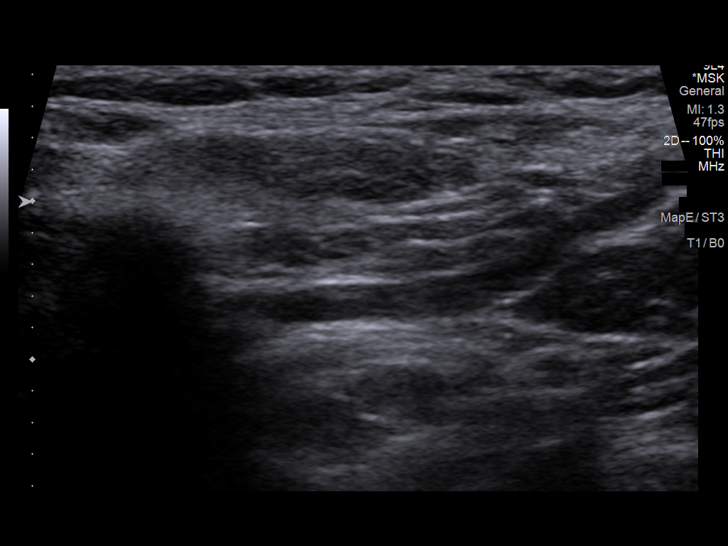
[im 15/39]
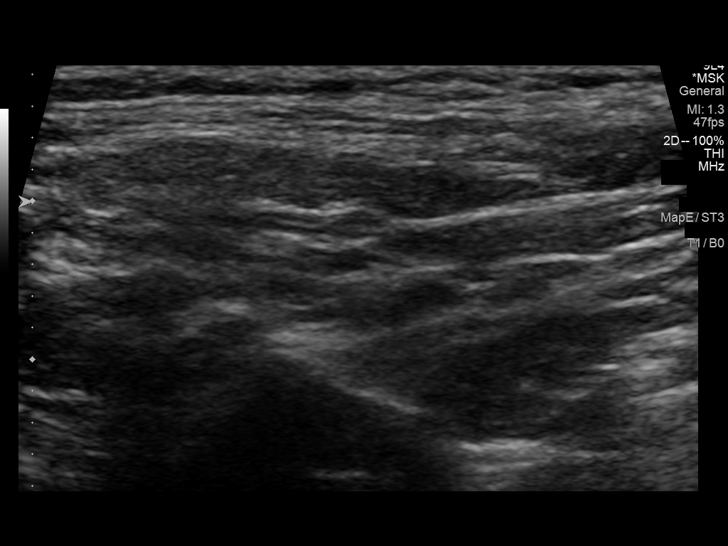
[im 18/39]
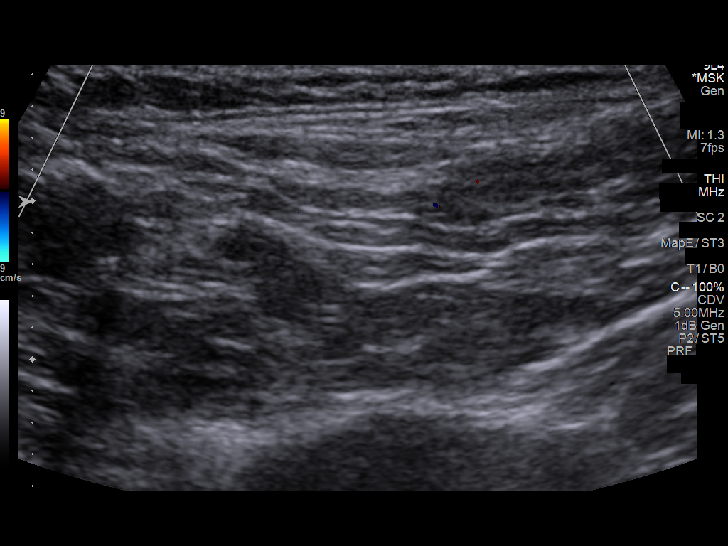
[im 21/39]
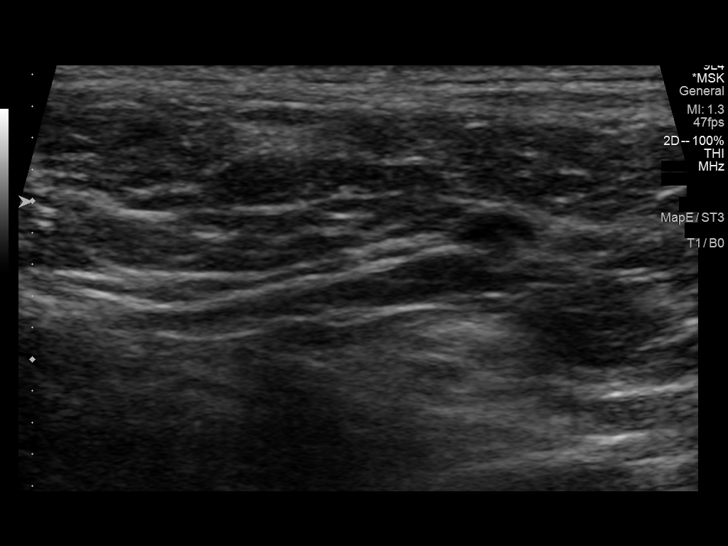
[im 24/39]
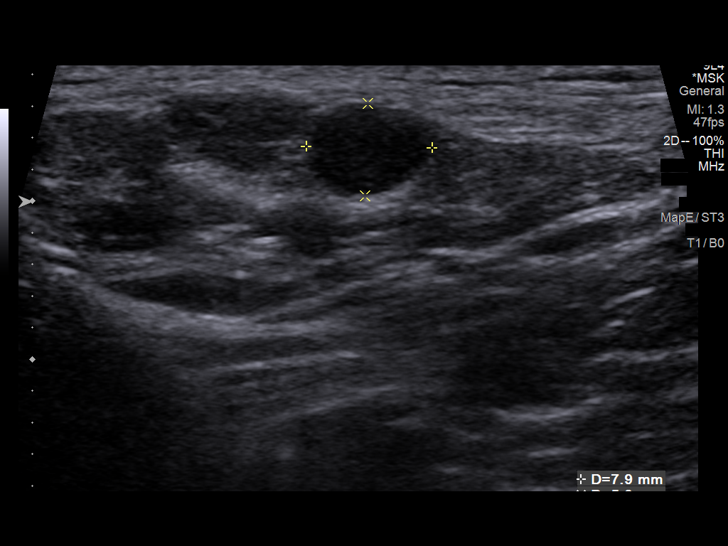
[im 26/39]
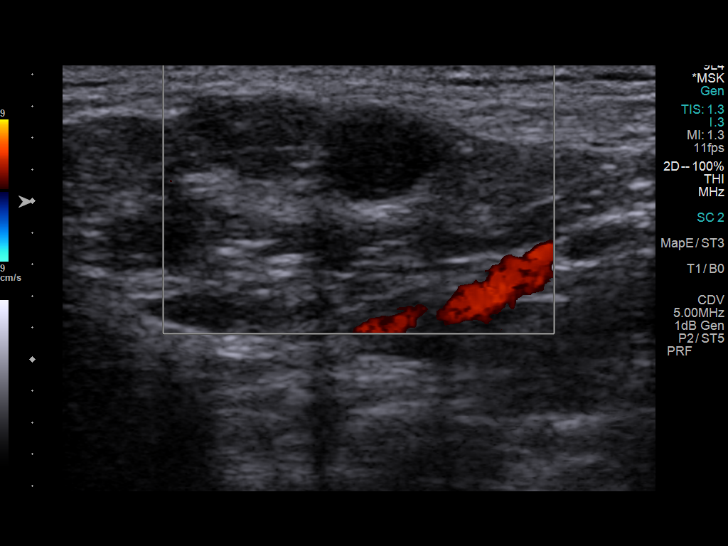
[im 29/39]
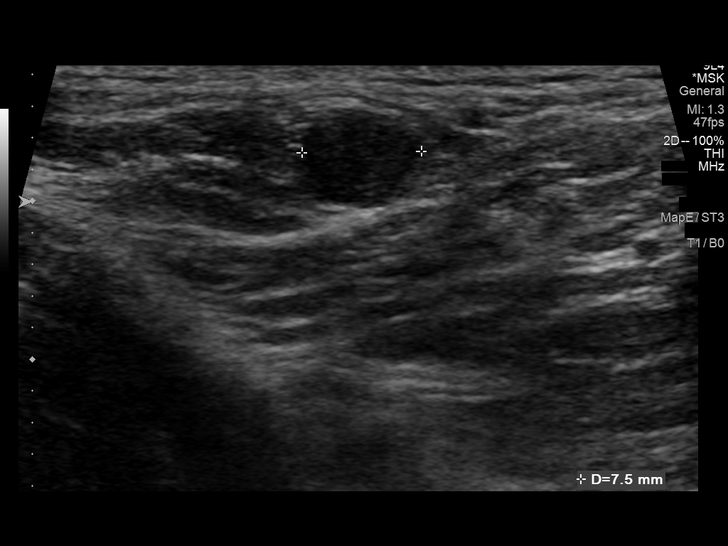
[im 32/39]
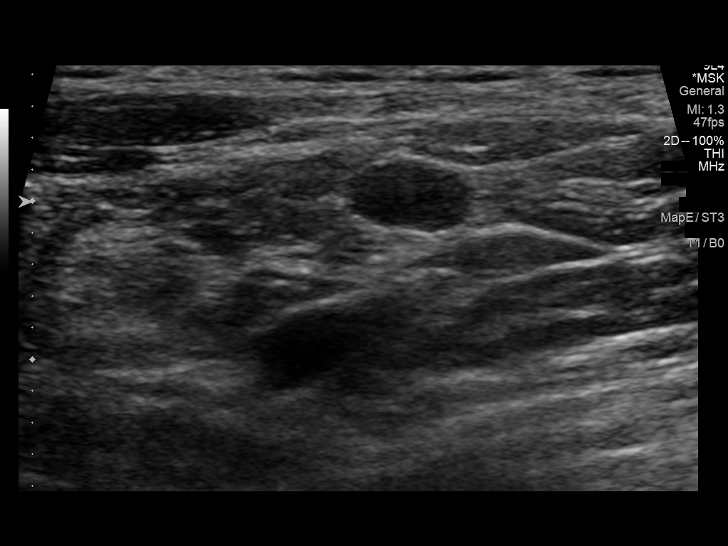
[im 35/39]
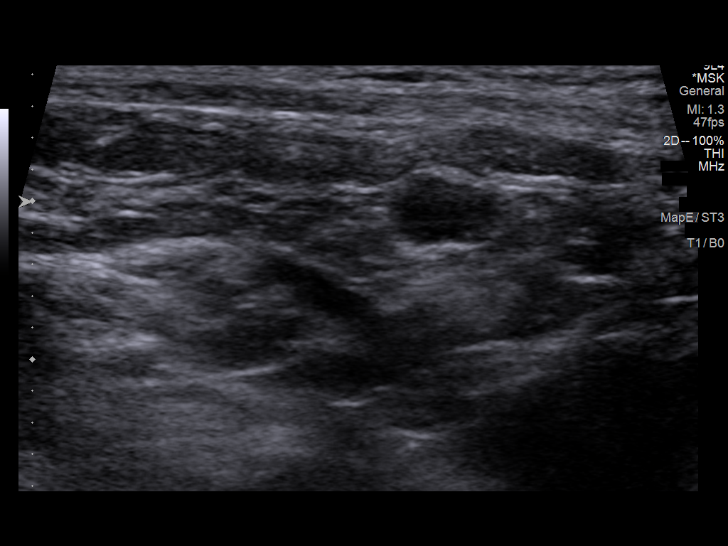
[im 39/39]
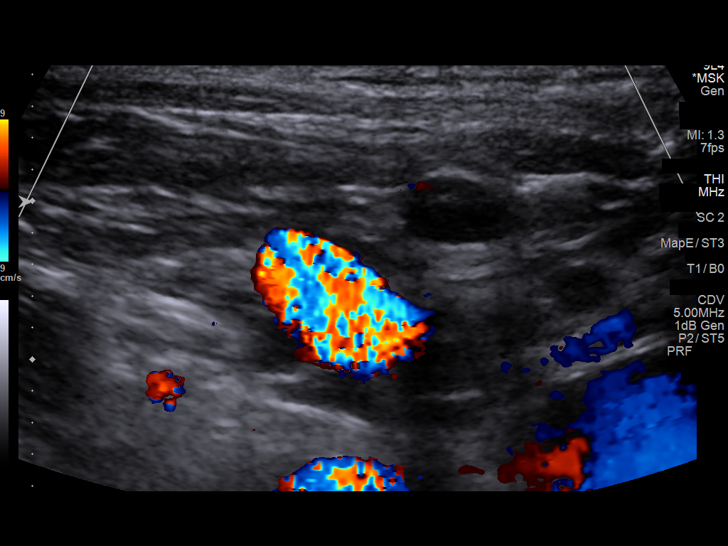

[14 of 25 positions shown; findings below may reference images not displayed]

FINDINGS: Grayscale and color duplex ultrasound performed in the region of
clinical concern.

Lymph node with typical architecture in the region clinical concern,
right supraclavicular. No focal fluid, edema, or soft tissue lesion.
IMPRESSION: Typical appearing lymph node in the region of clinical concern of
right supraclavicular region, possibly reactive though nonspecific

## 2019-02-15 NOTE — Progress Notes (Signed)
Reassuring typical appearing lymph node in the region of concern appears reactive. Try to not touch it and consider some cool compresses. GREAT news.

## 2019-02-16 ENCOUNTER — Encounter: Payer: Self-pay | Admitting: Physician Assistant

## 2019-02-23 ENCOUNTER — Other Ambulatory Visit: Payer: Self-pay | Admitting: Physician Assistant

## 2019-02-23 DIAGNOSIS — Z6834 Body mass index (BMI) 34.0-34.9, adult: Secondary | ICD-10-CM

## 2019-02-23 DIAGNOSIS — E6609 Other obesity due to excess calories: Secondary | ICD-10-CM

## 2019-02-23 NOTE — Telephone Encounter (Signed)
Appointment has been made. No further questions at this time.  

## 2019-02-23 NOTE — Telephone Encounter (Signed)
I am not going to refill this, I would think she needs a follow-up to show some weight loss.

## 2019-02-23 NOTE — Telephone Encounter (Signed)
Please schedule follow up with patient for weight/blood pressure check.

## 2019-02-23 NOTE — Telephone Encounter (Signed)
Last filled 01/02/2019 #30 with no refills. Please advise.  Nina Trujillo patient.

## 2019-02-28 ENCOUNTER — Ambulatory Visit (AMBULATORY_SURGERY_CENTER): Payer: BC Managed Care – PPO | Admitting: *Deleted

## 2019-02-28 ENCOUNTER — Ambulatory Visit (INDEPENDENT_AMBULATORY_CARE_PROVIDER_SITE_OTHER): Payer: BC Managed Care – PPO | Admitting: Physician Assistant

## 2019-02-28 ENCOUNTER — Other Ambulatory Visit: Payer: Self-pay

## 2019-02-28 VITALS — BP 138/66 | HR 96 | Ht 66.0 in | Wt 218.0 lb

## 2019-02-28 VITALS — Temp 96.8°F | Ht 66.0 in | Wt 218.0 lb

## 2019-02-28 DIAGNOSIS — Z6834 Body mass index (BMI) 34.0-34.9, adult: Secondary | ICD-10-CM | POA: Diagnosis not present

## 2019-02-28 DIAGNOSIS — E6609 Other obesity due to excess calories: Secondary | ICD-10-CM | POA: Diagnosis not present

## 2019-02-28 DIAGNOSIS — R200 Anesthesia of skin: Secondary | ICD-10-CM | POA: Diagnosis not present

## 2019-02-28 DIAGNOSIS — Z1211 Encounter for screening for malignant neoplasm of colon: Secondary | ICD-10-CM

## 2019-02-28 DIAGNOSIS — Z1159 Encounter for screening for other viral diseases: Secondary | ICD-10-CM

## 2019-02-28 MED ORDER — PEG 3350-KCL-NA BICARB-NACL 420 G PO SOLR: 4000.0000 mL | Freq: Once | ORAL | 0 refills | Status: AC

## 2019-02-28 MED ORDER — DICLOFENAC SODIUM 1 % TD GEL: 4.0000 g | Freq: Four times a day (QID) | TRANSDERMAL | 1 refills | Status: DC

## 2019-02-28 MED ORDER — TOPIRAMATE 25 MG PO TABS: 25.0000 mg | ORAL_TABLET | Freq: Two times a day (BID) | ORAL | 2 refills | Status: DC

## 2019-02-28 NOTE — Patient Instructions (Signed)
Carpal Tunnel Syndrome  Carpal tunnel syndrome is a condition that causes pain in your hand and arm. The carpal tunnel is a narrow area that is on the palm side of your wrist. Repeated wrist motion or certain diseases may cause swelling in the tunnel. This swelling can pinch the main nerve in the wrist (median nerve). What are the causes? This condition may be caused by:  Repeated wrist motions.  Wrist injuries.  Arthritis.  A sac of fluid (cyst) or abnormal growth (tumor) in the carpal tunnel.  Fluid buildup during pregnancy. Sometimes the cause is not known. What increases the risk? The following factors may make you more likely to develop this condition:  Having a job in which you move your wrist in the same way many times. This includes jobs like being a butcher or a cashier.  Being a woman.  Having other health conditions, such as: ? Diabetes. ? Obesity. ? A thyroid gland that is not active enough (hypothyroidism). ? Kidney failure. What are the signs or symptoms? Symptoms of this condition include:  A tingling feeling in your fingers.  Tingling or a loss of feeling (numbness) in your hand.  Pain in your entire arm. This pain may get worse when you bend your wrist and elbow for a long time.  Pain in your wrist that goes up your arm to your shoulder.  Pain that goes down into your palm or fingers.  A weak feeling in your hands. You may find it hard to grab and hold items. You may feel worse at night. How is this diagnosed? This condition is diagnosed with a medical history and physical exam. You may also have tests, such as:  Electromyogram (EMG). This test checks the signals that the nerves send to the muscles.  Nerve conduction study. This test checks how well signals pass through your nerves.  Imaging tests, such as X-rays, ultrasound, and MRI. These tests check for what might be the cause of your condition. How is this treated? This condition may be treated  with:  Lifestyle changes. You will be asked to stop or change the activity that caused your problem.  Doing exercise and activities that make bones and muscles stronger (physical therapy).  Learning how to use your hand again (occupational therapy).  Medicines for pain and swelling (inflammation). You may have injections in your wrist.  A wrist splint.  Surgery. Follow these instructions at home: If you have a splint:  Wear the splint as told by your doctor. Remove it only as told by your doctor.  Loosen the splint if your fingers: ? Tingle. ? Lose feeling (become numb). ? Turn cold and blue.  Keep the splint clean.  If the splint is not waterproof: ? Do not let it get wet. ? Cover it with a watertight covering when you take a bath or a shower. Managing pain, stiffness, and swelling   If told, put ice on the painful area: ? If you have a removable splint, remove it as told by your doctor. ? Put ice in a plastic bag. ? Place a towel between your skin and the bag. ? Leave the ice on for 20 minutes, 2-3 times per day. General instructions  Take over-the-counter and prescription medicines only as told by your doctor.  Rest your wrist from any activity that may cause pain. If needed, talk with your boss at work about changes that can help your wrist heal.  Do any exercises as told by your doctor,   physical therapist, or occupational therapist.  Keep all follow-up visits as told by your doctor. This is important. Contact a doctor if:  You have new symptoms.  Medicine does not help your pain.  Your symptoms get worse. Get help right away if:  You have very bad numbness or tingling in your wrist or hand. Summary  Carpal tunnel syndrome is a condition that causes pain in your hand and arm.  It is often caused by repeated wrist motions.  Lifestyle changes and medicines are used to treat this problem. Surgery may help in very bad cases.  Follow your doctor's  instructions about wearing a splint, resting your wrist, keeping follow-up visits, and calling for help. This information is not intended to replace advice given to you by your health care provider. Make sure you discuss any questions you have with your health care provider. Document Released: 04/22/2011 Document Revised: 09/09/2017 Document Reviewed: 09/09/2017 Elsevier Patient Education  2020 Elsevier Inc.  

## 2019-02-28 NOTE — Progress Notes (Signed)
Subjective:     Patient ID: Nina Trujillo, female   DOB: 03/12/1966, 53 y.o.   MRN: [id number]  HPI  Pt is a 53 yo female with T2DM, HTN, CKD who is concerned about her weight.   Pt is concerned with her weight. She continues to gain. She has only lost 1lb in last month. Phentermine did not cause her to loose any weight. She wants to know other options. She is not exercising. She is on wellbutrin.   She also mentions bilateral hand numbness and tingling. Worse in morning and at night. Tends to be her middle and index fingers. No injury. Seems to be getting a little worse.   .. Active Ambulatory Problems    Diagnosis Date Noted  . Tobacco dependence 05/31/2014  . Abnormal weight gain 05/31/2014  . Obese 05/31/2014  . Essential hypertension, benign 05/31/2014  . Diabetes mellitus without complication ([place]) 99991111  . Menopausal symptoms 05/31/2014  . Hidradenitis suppurativa 12/13/2014  . CKD (chronic kidney disease) stage 3, GFR 30-59 ml/min 12/16/2014  . Class 1 obesity due to excess calories with serious comorbidity and body mass index (BMI) of 34.0 to 34.9 in adult 03/21/2015  . Depression 03/21/2015  . Microalbuminuria due to type 2 diabetes mellitus ([place]) 08/25/2015  . Allergic rhinitis due to allergen 08/08/2017  . Upper back pain 05/19/2018  . Benign paroxysmal positional vertigo due to bilateral vestibular disorder 05/19/2018  . Mixed hyperlipidemia 05/22/2018  . Dyslipidemia, goal LDL below 70 05/22/2018   Resolved Ambulatory Problems    Diagnosis Date Noted  . No Resolved Ambulatory Problems   Past Medical History:  Diagnosis Date  . Chronic kidney disease   . Hypertension       Review of Systems  All other systems reviewed and are negative.      Objective:   Physical Exam Vitals signs reviewed.  Constitutional:      Appearance: Normal appearance. She is obese.  Cardiovascular:     Rate and Rhythm: Normal rate and regular rhythm.     Pulses:  Normal pulses.  Pulmonary:     Effort: Pulmonary effort is normal.     Breath sounds: Normal breath sounds.  Musculoskeletal:     Comments: Negative tinel's bilateral.  Positive phalens.  5/5 hand grip, bilateral.   Neurological:     General: No focal deficit present.     Mental Status: She is alert and oriented to person, place, and time.  Psychiatric:        Mood and Affect: Mood normal.        Behavior: Behavior normal.        Assessment:     Marland KitchenMarland KitchenStavroula was seen today for obesity.  Diagnoses and all orders for this visit:  Class 1 obesity due to excess calories with serious comorbidity and body mass index (BMI) of 34.0 to 34.9 in adult -     topiramate (TOPAMAX) 25 MG tablet; Take 1 tablet (25 mg total) by mouth 2 (two) times daily.  Bilateral hand numbness -     diclofenac sodium (VOLTAREN) 1 % GEL; Apply 4 g topically 4 (four) times daily. To affected joint.       Plan:     Marland KitchenMarland KitchenDiscussed low carb diet with 1500 calories and 80g of protein.  Exercising at least 150 minutes a week.  [employer] could be a Microbiologist.  Added topamax.  She is on wellbutrin.  She is on ozempic.   Likely carpel tunnel. Given  HO. CKD cannot talke oral NSAids. voltaren gel given. Wrist splints online. Follow up as needed.

## 2019-02-28 NOTE — Progress Notes (Signed)
Patient is here in-person for PV. Patient denies any allergies to eggs or soy. Patient denies any problems with anesthesia/sedation. Patient denies any oxygen use at home. Patient denies taking any diet/weight loss medications or blood thinners. Patient is not being treated for MRSA or C-diff. EMMI education assisgned to patient on colonoscopy, this was explained and instructions given to patient.   Pt is aware that care partner will wait in the car during procedure; if they feel like they will be too hot to wait in the car; they may wait in the lobby. Patient is aware to bring only one care partner. We want them to wear a mask (we do not have any that we can provide them), practice social distancing, and we will check their temperatures when they get here.  I did remind patient that their care partner needs to stay in the parking lot the entire time. Pt will wear mask into building.  Covid screening test is on 10/23 at 1 pm-pt is aware!

## 2019-03-02 ENCOUNTER — Encounter: Payer: Self-pay | Admitting: Gastroenterology

## 2019-03-08 ENCOUNTER — Encounter: Payer: Self-pay | Admitting: Physician Assistant

## 2019-03-09 ENCOUNTER — Telehealth: Payer: Self-pay | Admitting: *Deleted

## 2019-03-09 DIAGNOSIS — Z1159 Encounter for screening for other viral diseases: Secondary | ICD-10-CM | POA: Diagnosis not present

## 2019-03-09 NOTE — Telephone Encounter (Signed)
Covid test ordered again per [facility] request  

## 2019-03-10 LAB — SARS CORONAVIRUS 2 (TAT 6-24 HRS): SARS Coronavirus 2: NEGATIVE

## 2019-03-14 ENCOUNTER — Ambulatory Visit (AMBULATORY_SURGERY_CENTER): Payer: BC Managed Care – PPO | Admitting: Gastroenterology

## 2019-03-14 ENCOUNTER — Encounter: Payer: Self-pay | Admitting: Gastroenterology

## 2019-03-14 ENCOUNTER — Other Ambulatory Visit: Payer: Self-pay

## 2019-03-14 VITALS — BP 141/86 | HR 88 | Temp 98.3°F | Resp 14 | Ht 66.0 in | Wt 218.0 lb

## 2019-03-14 DIAGNOSIS — K5289 Other specified noninfective gastroenteritis and colitis: Secondary | ICD-10-CM

## 2019-03-14 DIAGNOSIS — K5000 Crohn's disease of small intestine without complications: Secondary | ICD-10-CM | POA: Diagnosis not present

## 2019-03-14 DIAGNOSIS — D122 Benign neoplasm of ascending colon: Secondary | ICD-10-CM

## 2019-03-14 DIAGNOSIS — Z1211 Encounter for screening for malignant neoplasm of colon: Secondary | ICD-10-CM

## 2019-03-14 DIAGNOSIS — K635 Polyp of colon: Secondary | ICD-10-CM

## 2019-03-14 MED ORDER — SODIUM CHLORIDE 0.9 % IV SOLN: 500.0000 mL | Freq: Once | INTRAVENOUS | Status: DC

## 2019-03-14 NOTE — Progress Notes (Signed)
Called to room to assist during endoscopic procedure.  Patient ID and intended procedure confirmed with present staff. Received instructions for my participation in the procedure from the performing physician.  

## 2019-03-14 NOTE — Op Note (Signed)
[facility] Patient Name: Nina Trujillo Procedure Date: 03/14/2019 10:53 AM MRN: [id number] Endoscopist: Remo Lipps P. Havery Moros , MD Age: 53 Referring MD:  Date of Birth: [birth year] Gender: Female Account #: [id number] Procedure:                Colonoscopy Indications:              Screening for colorectal malignant neoplasm Medicines:                Monitored Anesthesia Care Procedure:                Pre-Anesthesia Assessment:                           - Prior to the procedure, a History and Physical                            was performed, and patient medications and                            allergies were reviewed. The patient's tolerance of                            previous anesthesia was also reviewed. The risks                            and benefits of the procedure and the sedation                            options and risks were discussed with the patient.                            All questions were answered, and informed consent                            was obtained. Prior Anticoagulants: The patient has                            taken no previous anticoagulant or antiplatelet                            agents. ASA Grade Assessment: II - A patient with                            mild systemic disease. After reviewing the risks                            and benefits, the patient was deemed in                            satisfactory condition to undergo the procedure.                           After obtaining informed consent, the colonoscope  was passed under direct vision. Throughout the                            procedure, the patient's blood pressure, pulse, and                            oxygen saturations were monitored continuously. The                            Colonoscope was introduced through the anus and                            advanced to the the terminal ileum, with                            identification of  the appendiceal orifice and IC                            valve. The colonoscopy was performed without                            difficulty. The patient tolerated the procedure                            well. The quality of the bowel preparation was                            good. The terminal ileum, ileocecal valve,                            appendiceal orifice, and rectum were photographed. Scope In: 11:05:37 AM Scope Out: 11:28:12 AM Scope Withdrawal Time: 0 hours 17 minutes 18 seconds  Total Procedure Duration: 0 hours 22 minutes 35 seconds  Findings:                 The perianal and digital rectal examinations were                            normal.                           The terminal ileum contained multiple diffuse                            aphthae. Biopsies were taken with a cold forceps                            for histology.                           A 5 mm polyp was found in the ascending colon. The                            polyp was flat. The polyp was removed with a cold  snare. Resection and retrieval were complete.                           Internal hemorrhoids were found during retroflexion.                           There was some restricted mobility in the sigmoid                            colon with angulated turns, which took some time to                            navigate during cecal intubation. The exam was                            otherwise normal throughout the examined colon. Complications:            No immediate complications. Estimated blood loss:                            Minimal. Estimated Blood Loss:     Estimated blood loss was minimal. Impression:               - Small apthous ulcerations in the terminal ileum -                            etiology includes NSAIDs, infectious, vs. mild                            Crohn's. Biopsied.                           - One 5 mm polyp in the ascending colon, removed                             with a cold snare. Resected and retrieved.                           - Restricted mobility of the sigmoid colon.                           - Internal hemorrhoids. Recommendation:           - Patient has a contact number available for                            emergencies. The signs and symptoms of potential                            delayed complications were discussed with the                            patient. Return to normal activities tomorrow.                            Written discharge  instructions were provided to the                            patient.                           - Resume previous diet.                           - Continue present medications.                           - Await pathology results. Remo Lipps P. Alvy Alsop, MD 03/14/2019 11:33:17 AM This report has been signed electronically.

## 2019-03-14 NOTE — Progress Notes (Signed)
A/ox3, pleased with MAC, report to RN 

## 2019-03-14 NOTE — Progress Notes (Signed)
Pt's states no medical or surgical changes since previsit or office visit. 

## 2019-03-14 NOTE — Patient Instructions (Signed)
hANDOUTS GIVEN :  POLYPS AND HEMORRHOIDS   YOU HAD AN ENDOSCOPIC PROCEDURE TODAY AT [facility]:   Refer to the procedure report that was given to you for any specific questions about what was found during the examination.  If the procedure report does not answer your questions, please call your gastroenterologist to clarify.  If you requested that your care partner not be given the details of your procedure findings, then the procedure report has been included in a sealed envelope for you to review at your convenience later.  YOU SHOULD EXPECT: Some feelings of bloating in the abdomen. Passage of more gas than usual.  Walking can help get rid of the air that was put into your GI tract during the procedure and reduce the bloating. If you had a lower endoscopy (such as a colonoscopy or flexible sigmoidoscopy) you may notice spotting of blood in your stool or on the toilet paper. If you underwent a bowel prep for your procedure, you may not have a normal bowel movement for a few days.  Please Note:  You might notice some irritation and congestion in your nose or some drainage.  This is from the oxygen used during your procedure.  There is no need for concern and it should clear up in a day or so.  SYMPTOMS TO REPORT IMMEDIATELY:   Following lower endoscopy (colonoscopy or flexible sigmoidoscopy):  Excessive amounts of blood in the stool  Significant tenderness or worsening of abdominal pains  Swelling of the abdomen that is new, acute  Fever of 100F or higher    For urgent or emergent issues, a gastroenterologist can be reached at any hour by calling [phone].   DIET:  We do recommend a small meal at first, but then you may proceed to your regular diet.  Drink plenty of fluids but you should avoid alcoholic beverages for 24 hours.  ACTIVITY:  You should plan to take it easy for the rest of today and you should NOT DRIVE or use heavy machinery until tomorrow (because of  the sedation medicines used during the test).    FOLLOW UP: Our staff will call the number listed on your records 48-72 hours following your procedure to check on you and address any questions or concerns that you may have regarding the information given to you following your procedure. If we do not reach you, we will leave a message.  We will attempt to reach you two times.  During this call, we will ask if you have developed any symptoms of COVID 19. If you develop any symptoms (ie: fever, flu-like symptoms, shortness of breath, cough etc.) before then, please call [phone].  If you test positive for Covid 19 in the 2 weeks post procedure, please call and report this information to [place].    If any biopsies were taken you will be contacted by phone or by letter within the next 1-3 weeks.  Please call us at [phone] if you have not heard about the biopsies in 3 weeks.    SIGNATURES/CONFIDENTIALITY: You and/or your care partner have signed paperwork which will be entered into your electronic medical record.  These signatures attest to the fact that that the information above on your After Visit Summary has been reviewed and is understood.  Full responsibility of the confidentiality of this discharge information lies with you and/or your care-partner.

## 2019-03-14 NOTE — Progress Notes (Signed)
Vs in admitting by [place] covid questions @ front desk by JB

## 2019-03-16 ENCOUNTER — Telehealth: Payer: Self-pay | Admitting: *Deleted

## 2019-03-16 NOTE — Telephone Encounter (Signed)
  Follow up Call-  Call back number 03/14/2019  Post procedure Call Back phone  # [phone]  Permission to leave phone message Yes  Some recent data might be hidden     Patient questions:  Do you have a fever, pain , or abdominal swelling? No. Pain Score  0 *  Have you tolerated food without any problems? Yes.    Have you been able to return to your normal activities? Yes.    Do you have any questions about your discharge instructions: Diet   No. Medications  No. Follow up visit  No.  Do you have questions or concerns about your Care? No.  Actions: * If pain score is 4 or above: No action needed, pain <4. 1. Have you developed a fever since your procedure? no  2.   Have you had an respiratory symptoms (SOB or cough) since your procedure? no  3.   Have you tested positive for COVID 19 since your procedure no  4.   Have you had any family members/close contacts diagnosed with the COVID 19 since your procedure?  no   If yes to any of these questions please route to Joylene John, RN and Alphonsa Gin, Therapist, sports.

## 2019-03-19 DIAGNOSIS — L0292 Furuncle, unspecified: Secondary | ICD-10-CM | POA: Diagnosis not present

## 2019-03-19 DIAGNOSIS — M62838 Other muscle spasm: Secondary | ICD-10-CM | POA: Diagnosis not present

## 2019-04-04 ENCOUNTER — Encounter: Payer: Self-pay | Admitting: Physician Assistant

## 2019-04-04 ENCOUNTER — Other Ambulatory Visit: Payer: Self-pay

## 2019-04-04 ENCOUNTER — Ambulatory Visit (INDEPENDENT_AMBULATORY_CARE_PROVIDER_SITE_OTHER): Payer: BC Managed Care – PPO

## 2019-04-04 ENCOUNTER — Ambulatory Visit (INDEPENDENT_AMBULATORY_CARE_PROVIDER_SITE_OTHER): Payer: BC Managed Care – PPO | Admitting: Physician Assistant

## 2019-04-04 VITALS — BP 118/62 | HR 90 | Ht 66.0 in | Wt 220.0 lb

## 2019-04-04 DIAGNOSIS — M5412 Radiculopathy, cervical region: Secondary | ICD-10-CM

## 2019-04-04 DIAGNOSIS — M5030 Other cervical disc degeneration, unspecified cervical region: Secondary | ICD-10-CM

## 2019-04-04 DIAGNOSIS — E119 Type 2 diabetes mellitus without complications: Secondary | ICD-10-CM

## 2019-04-04 DIAGNOSIS — N1831 Chronic kidney disease, stage 3a: Secondary | ICD-10-CM

## 2019-04-04 DIAGNOSIS — I10 Essential (primary) hypertension: Secondary | ICD-10-CM

## 2019-04-04 DIAGNOSIS — M47812 Spondylosis without myelopathy or radiculopathy, cervical region: Secondary | ICD-10-CM | POA: Diagnosis not present

## 2019-04-04 LAB — POCT GLYCOSYLATED HEMOGLOBIN (HGB A1C): Hemoglobin A1C: 7 % — AB (ref 4.0–5.6)

## 2019-04-04 IMAGING — DX DG CERVICAL SPINE COMPLETE 4+V
5 series · 5 of 5 positions shown · non-contrast
Comparison: None.

CLINICAL DATA: Left arm radiculopathy for several months, no known
injury, initial encounter

EXAM:
CERVICAL SPINE - COMPLETE 4+ VIEW

[c-spine lat]
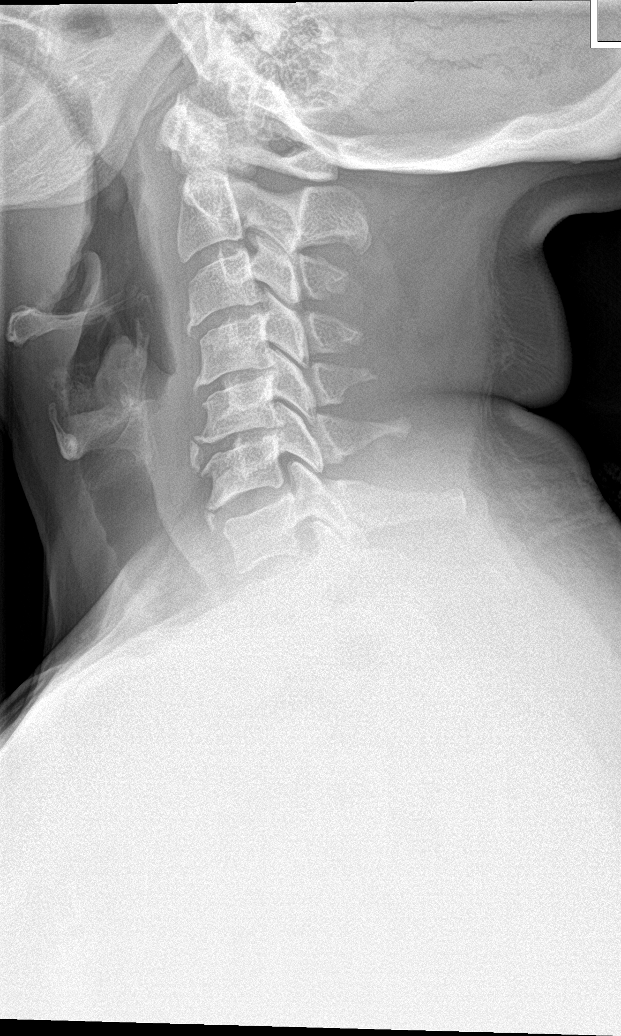

[c-spine obl (1 of 2)]
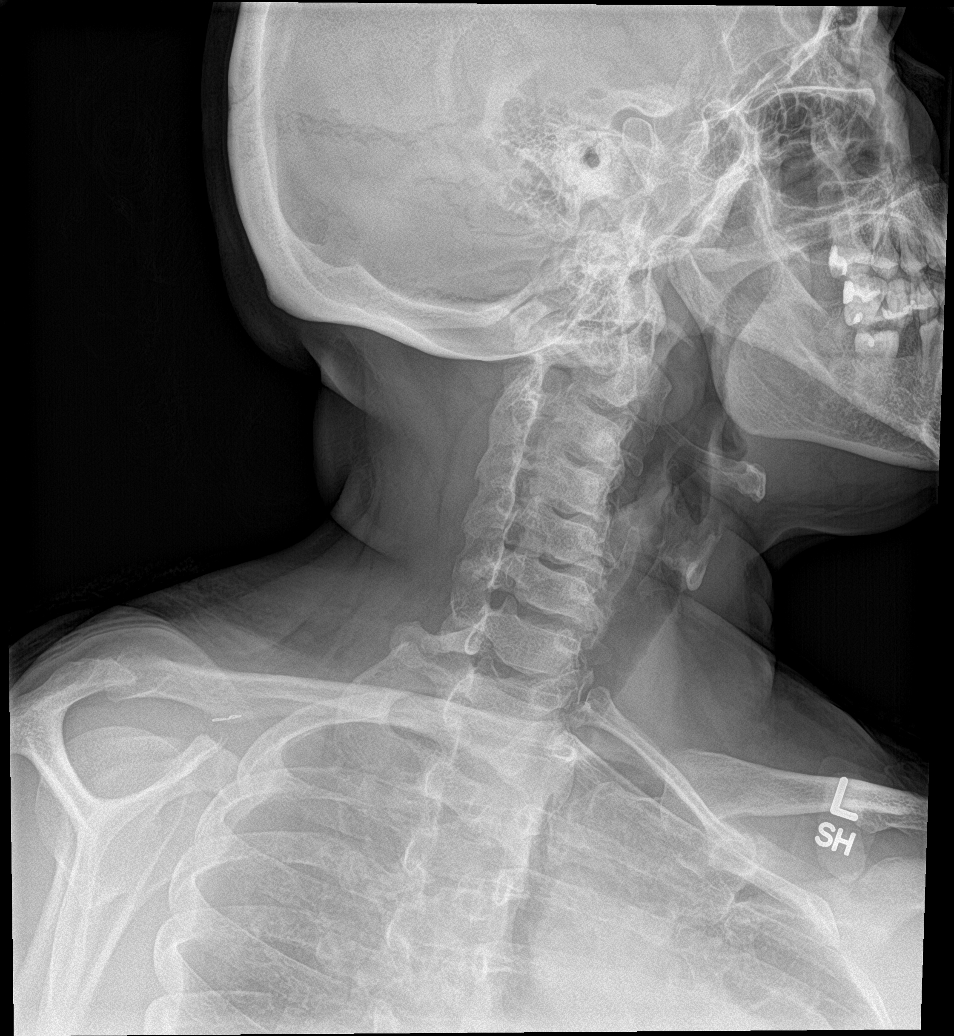

[c-spine obl (2 of 2)]
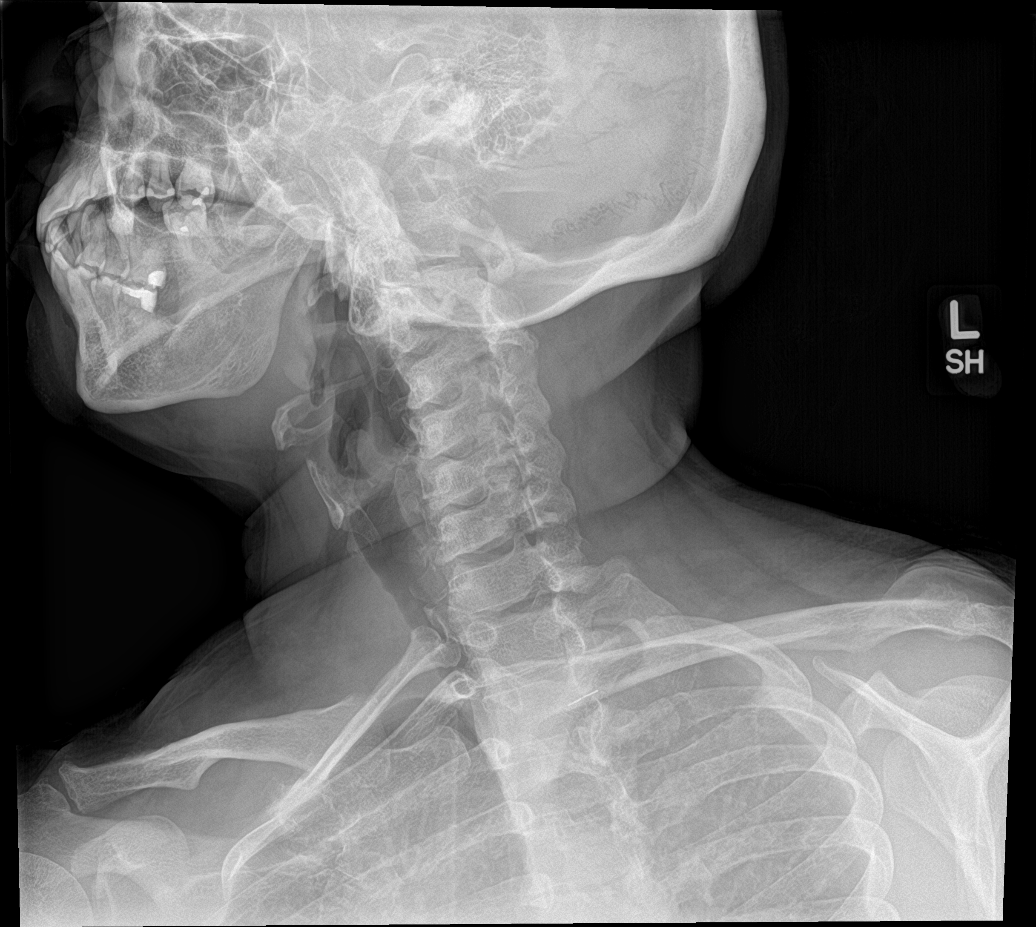

[c-spine ap]
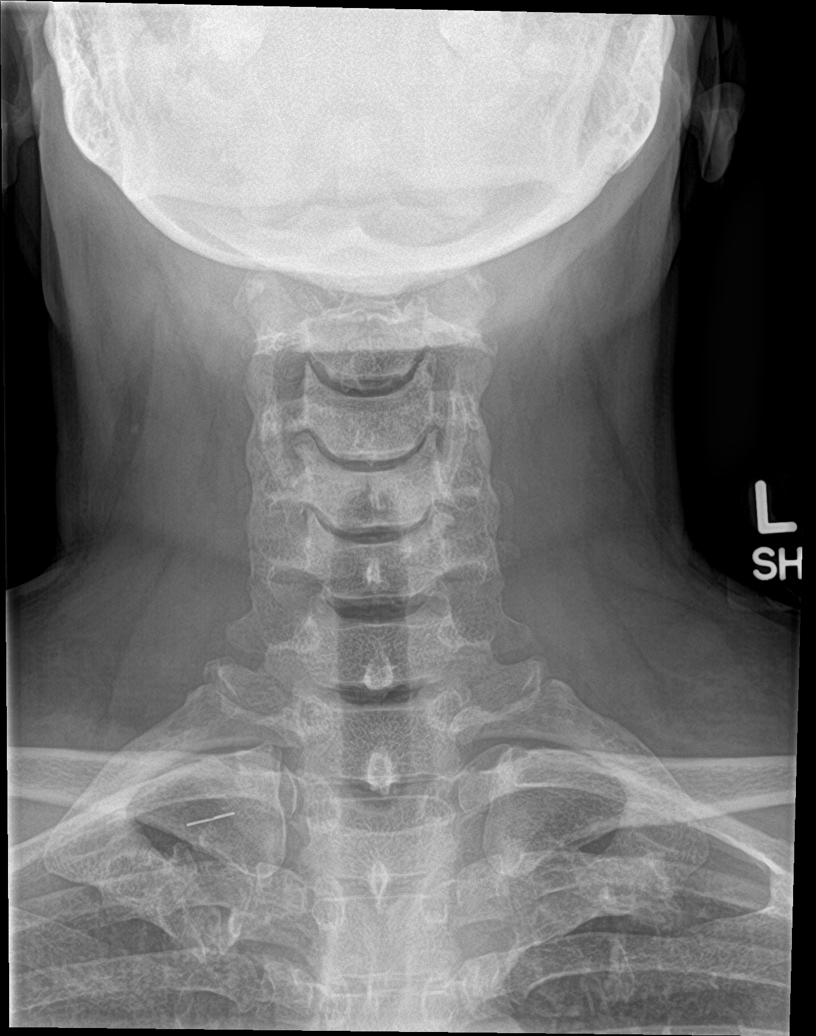

[c-spine open mouth]
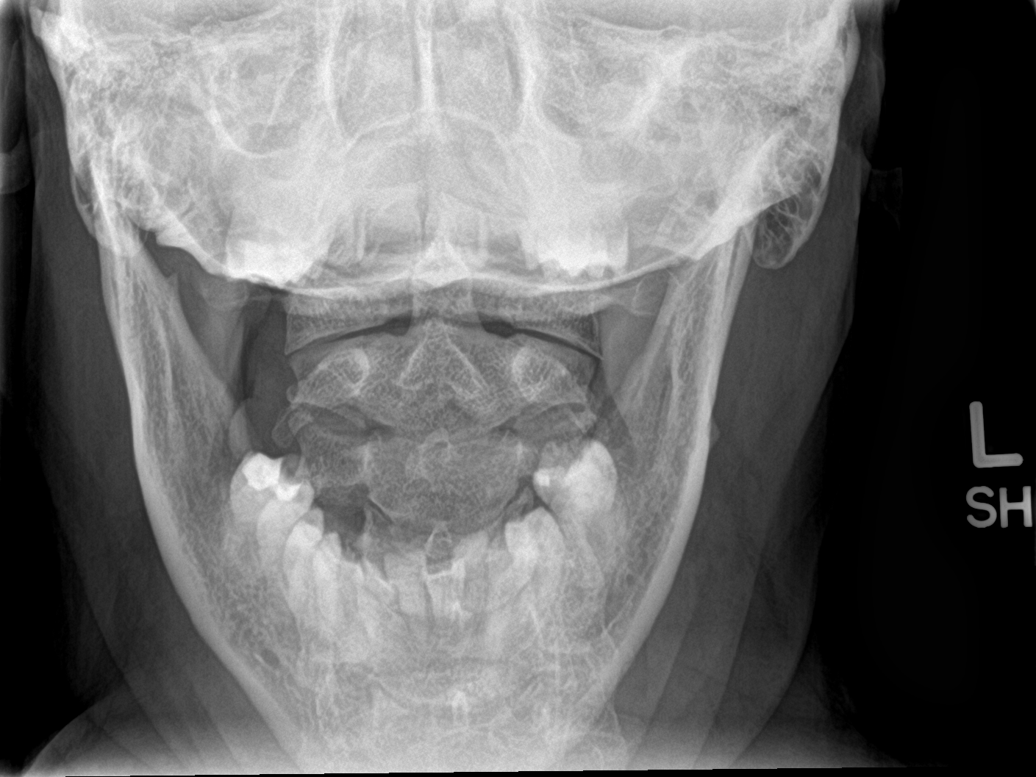

[5 of 5 positions shown; findings below may reference images not displayed]

FINDINGS: Seven cervical segments are well visualized. Vertebral body height
is well maintained. Osteophytic changes are noted from C3-C7. Mild
neural foraminal narrowing is noted on the left. No acute fracture
or acute facet abnormality is noted. The odontoid is within normal
limits. No other focal abnormality is seen.
IMPRESSION: Multilevel degenerative change with mild left neural foraminal
narrowing.

## 2019-04-04 MED ORDER — PREDNISONE 50 MG PO TABS: 50.0000 mg | ORAL_TABLET | Freq: Every day | ORAL | 0 refills | Status: DC

## 2019-04-04 NOTE — Progress Notes (Signed)
Subjective:    Patient ID: Nina Trujillo, female    DOB: 1965-11-27, 53 y.o.   MRN: [id number]  HPI  Pt is a 53 yo obese female with HTN, T2DM, CKD who presents to the clinic for follow up.   She is doing well on ozempic, tresiba, metformin. .not checking sugars. No hypoglycemia. No open sores or wounds. Not following DM diet. Trying to lose weight. Added topamax. Gained 2lbs.   Pt denies any CP, palpitations, headaches.   Pt is having left sided neck pain, upper left back pain for weeks. Pain radiates into thumb, index and middle fingers. Describes as sharp and at times numbness and tingling. No injury/trauma.    .. Active Ambulatory Problems    Diagnosis Date Noted  . Tobacco dependence 05/31/2014  . Abnormal weight gain 05/31/2014  . Obese 05/31/2014  . Essential hypertension, benign 05/31/2014  . Diabetes mellitus without complication ([place]) 99991111  . Menopausal symptoms 05/31/2014  . Hidradenitis suppurativa 12/13/2014  . CKD (chronic kidney disease) stage 3, GFR 30-59 ml/min 12/16/2014  . Class 1 obesity due to excess calories with serious comorbidity and body mass index (BMI) of 34.0 to 34.9 in adult 03/21/2015  . Depression 03/21/2015  . Microalbuminuria due to type 2 diabetes mellitus ([place]) 08/25/2015  . Allergic rhinitis due to allergen 08/08/2017  . Upper back pain 05/19/2018  . Benign paroxysmal positional vertigo due to bilateral vestibular disorder 05/19/2018  . Mixed hyperlipidemia 05/22/2018  . Dyslipidemia, goal LDL below 70 05/22/2018  . DDD (degenerative disc disease), cervical 04/04/2019  . Cervical radiculopathy 04/09/2019   Resolved Ambulatory Problems    Diagnosis Date Noted  . No Resolved Ambulatory Problems   Past Medical History:  Diagnosis Date  . Chronic kidney disease   . Hypertension      Review of Systems    see HPI.  Objective:   Physical Exam Vitals signs reviewed.  Constitutional:      Appearance: Normal appearance.   Cardiovascular:     Rate and Rhythm: Normal rate and regular rhythm.     Pulses: Normal pulses.  Pulmonary:     Effort: Pulmonary effort is normal.     Breath sounds: Normal breath sounds.  Musculoskeletal:     Comments: NROM of neck. No tenderness to palpitation over cervical spine.  5/5 left arm strength, bilateral.  Radiation of pain from shoulder down into C6/7 distribution on left side.    Neurological:     General: No focal deficit present.     Mental Status: She is alert and oriented to person, place, and time.  Psychiatric:        Mood and Affect: Mood normal.        Behavior: Behavior normal.       .. Results for orders placed or performed in visit on 04/04/19  POCT glycosylated hemoglobin (Hb A1C)  Result Value Ref Range   Hemoglobin A1C 7.0 (A) 4.0 - 5.6 %   HbA1c POC (<> result, manual entry)     HbA1c, POC (prediabetic range)     HbA1c, POC (controlled diabetic range)         Assessment & Plan:  Nina Trujillo KitchenMarland KitchenEbonnie was seen today for diabetes.  Diagnoses and all orders for this visit:  Diabetes mellitus without complication ([place]) -     POCT glycosylated hemoglobin (Hb A1C)  Cervical radiculopathy -     DG Cervical Spine Complete -     predniSONE (DELTASONE) 50 MG tablet; Take 1 tablet (  50 mg total) by mouth daily.  Essential hypertension, benign  DDD (degenerative disc disease), cervical  Stage 3a chronic kidney disease   A1C looks good.  Continue to keep DM diet to get under 7.  Continue medications.  On statin.  bP to goal.  UTD eye exam.  UTD vaccines.  Follow up in 3 months.   Suspect cervical radiculopathy. Needs sports med visit. Xray done today. Muscle realxer given and burst of prednisone. Due to CKD no NSAIDs. Heat/ice, icy hot patches, tens unit discussed. START exercises given.   Follow up in 3 months.

## 2019-04-04 NOTE — Progress Notes (Signed)
[name],   Lots of arthritis called cervical degenerative disc disease C3-C7. There is also some narrowing more noted on the left. That is why your pain is on the left. I would definitely see Dr. Darene Lamer in office to consider epidural injections.   Nina Trujillo

## 2019-04-04 NOTE — Patient Instructions (Signed)

## 2019-04-09 DIAGNOSIS — M5412 Radiculopathy, cervical region: Secondary | ICD-10-CM | POA: Insufficient documentation

## 2019-05-02 ENCOUNTER — Other Ambulatory Visit: Payer: Self-pay | Admitting: Neurology

## 2019-05-02 DIAGNOSIS — M5412 Radiculopathy, cervical region: Secondary | ICD-10-CM

## 2019-05-02 NOTE — Telephone Encounter (Signed)
Patient left vm asking for a muscle relaxer for her back.   It looks like she was seen by [facility] urgent care 03/19/2019 and given cyclobenzaprine #30 no refills.   Left message on machine for patient to call back to give more detail about back pain.

## 2019-05-03 MED ORDER — CYCLOBENZAPRINE HCL 10 MG PO TABS: 10.0000 mg | ORAL_TABLET | Freq: Every day | ORAL | 0 refills | Status: DC

## 2019-05-03 NOTE — Telephone Encounter (Signed)
Left message on machine for patient letting her know RX was sent to pharmacy.

## 2019-05-03 NOTE — Telephone Encounter (Signed)
Patient called back and left vm stating she has tightness in both shoulders. Please advise on muscle relaxer.

## 2019-06-19 ENCOUNTER — Telehealth: Payer: Self-pay

## 2019-06-19 NOTE — Telephone Encounter (Signed)
Patient called stating that she need letter written about her medical conditions so that she can submit it for unemployemtn. Please call patient, she will need an appointment for thi (ok for virtual)

## 2019-06-20 ENCOUNTER — Ambulatory Visit (INDEPENDENT_AMBULATORY_CARE_PROVIDER_SITE_OTHER): Payer: BC Managed Care – PPO

## 2019-06-20 ENCOUNTER — Encounter: Payer: Self-pay | Admitting: Physician Assistant

## 2019-06-20 ENCOUNTER — Other Ambulatory Visit: Payer: Self-pay

## 2019-06-20 ENCOUNTER — Ambulatory Visit (INDEPENDENT_AMBULATORY_CARE_PROVIDER_SITE_OTHER): Payer: BC Managed Care – PPO | Admitting: Physician Assistant

## 2019-06-20 VITALS — BP 139/70 | HR 96 | Wt 223.0 lb

## 2019-06-20 DIAGNOSIS — M5030 Other cervical disc degeneration, unspecified cervical region: Secondary | ICD-10-CM

## 2019-06-20 DIAGNOSIS — M25562 Pain in left knee: Secondary | ICD-10-CM | POA: Diagnosis not present

## 2019-06-20 DIAGNOSIS — M5412 Radiculopathy, cervical region: Secondary | ICD-10-CM

## 2019-06-20 DIAGNOSIS — M25561 Pain in right knee: Secondary | ICD-10-CM

## 2019-06-20 DIAGNOSIS — E1122 Type 2 diabetes mellitus with diabetic chronic kidney disease: Secondary | ICD-10-CM | POA: Diagnosis not present

## 2019-06-20 IMAGING — DX DG KNEE STANDING AP BILAT
1 series · 1 of 1 positions shown · non-contrast
Comparison: None.

CLINICAL DATA: Bilateral knee pain.

EXAM:
BILATERAL KNEES STANDING - 1 VIEW

[knee ap bilat standing]
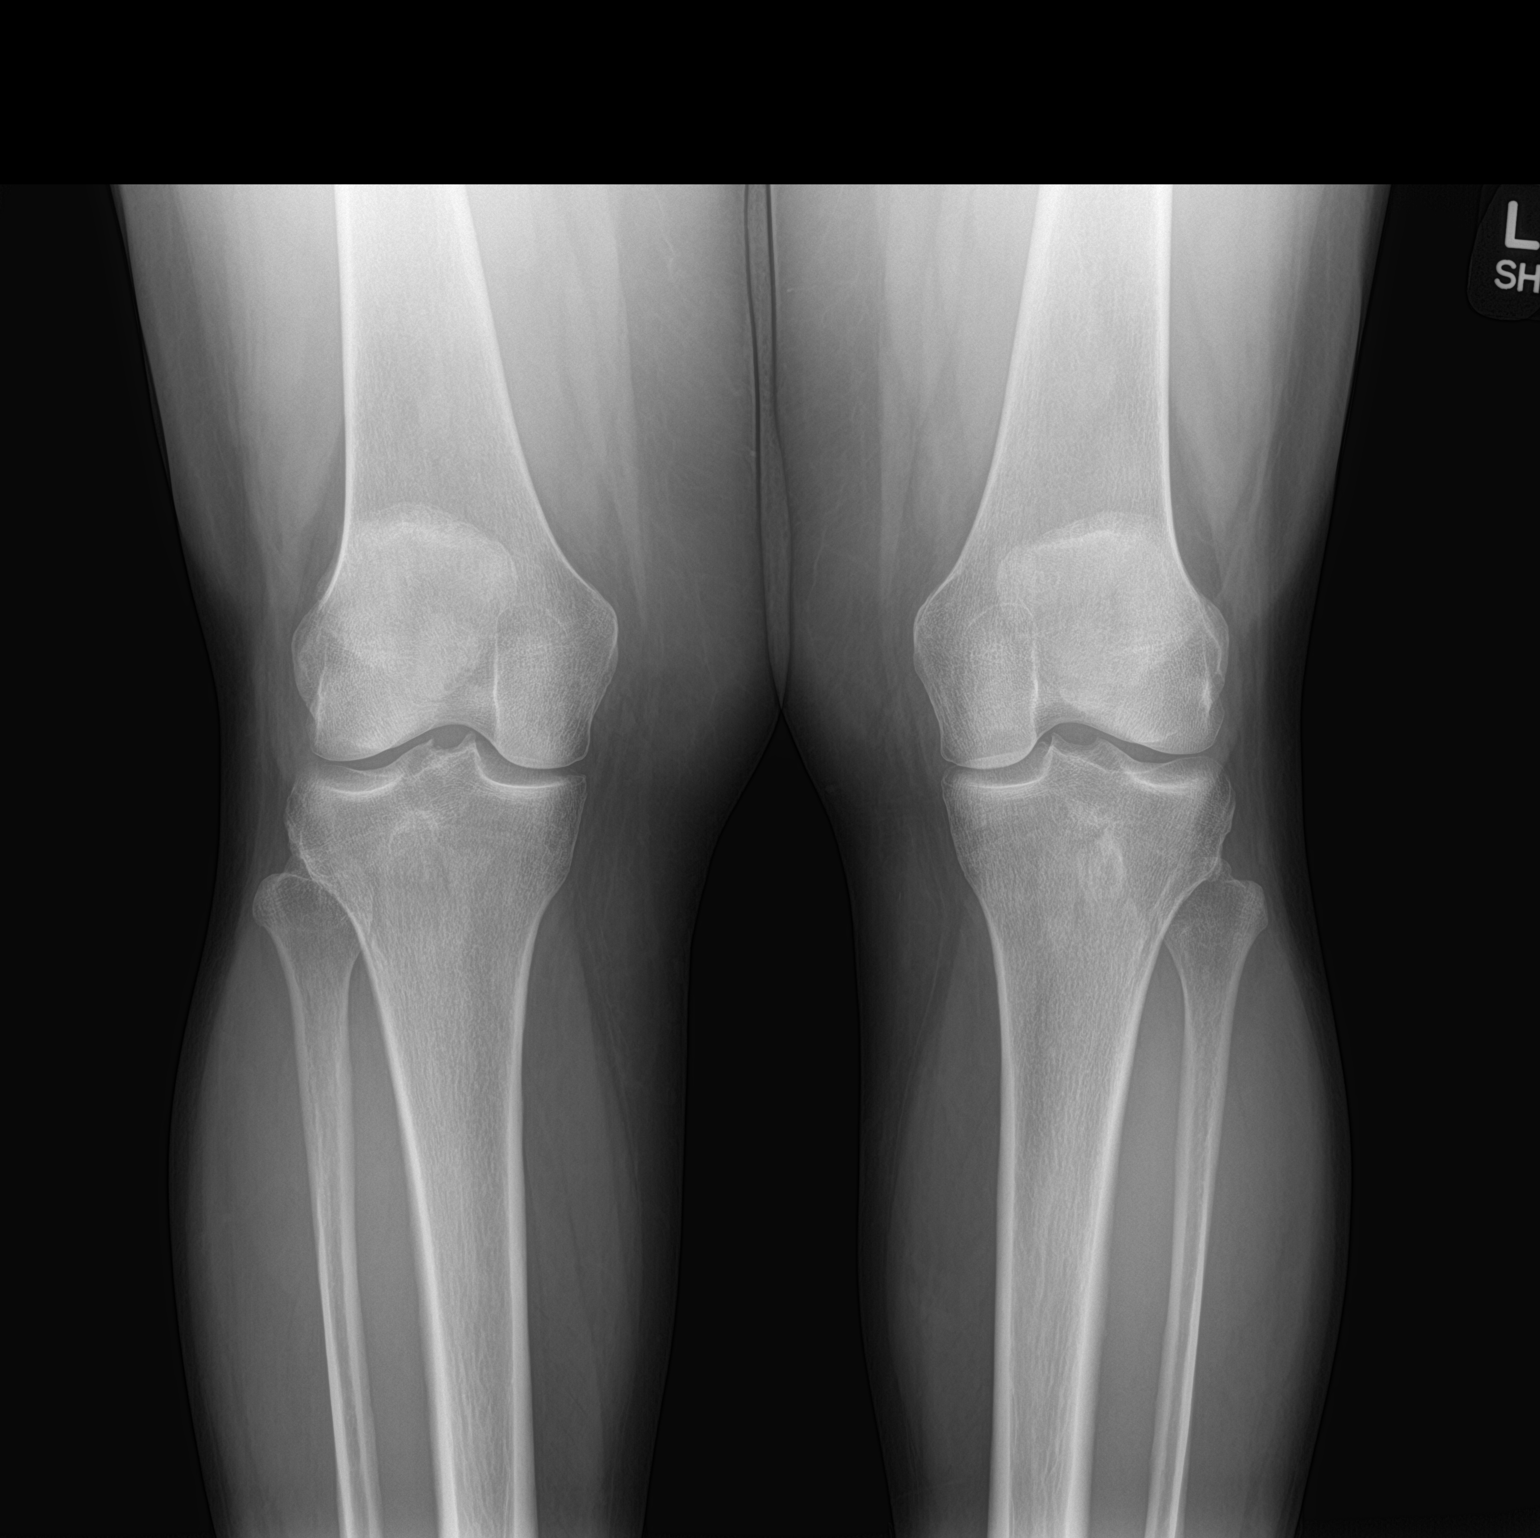

[1 of 1 positions shown; findings below may reference images not displayed]

FINDINGS: No evidence of fracture, dislocation, or joint effusion. No evidence
of arthropathy or other focal bone abnormality. Soft tissues are
unremarkable.
IMPRESSION: Negative.

## 2019-06-20 MED ORDER — TRIAMCINOLONE ACETONIDE 0.1 % EX CREA: 1.0000 "application " | TOPICAL_CREAM | Freq: Two times a day (BID) | CUTANEOUS | 0 refills | Status: DC

## 2019-06-20 MED ORDER — FREESTYLE LIBRE 14 DAY READER DEVI: 1.0000 | 11 refills | Status: DC

## 2019-06-20 MED ORDER — FREESTYLE LIBRE 14 DAY SENSOR MISC: 1.0000 | 11 refills | Status: DC

## 2019-06-20 NOTE — Progress Notes (Signed)
Subjective:    Patient ID: Nina Trujillo, female    DOB: 1966-05-12, 54 y.o.   MRN: [id number]  HPI  Pt is a 54 yo female with T2DM and cervical radiculopathy who presents to the clinic for follow up.   Her whole left hand is numb all the time and at times her right hand is numb. She is losing some function of hand making it hard to do some functions. No new injury. Cervical neck showed some DDD. Prednisone helped but then pain came back.   Her knees have hurt for the last month. No injury. Tender to touch at times. Hard to lean on them. Not tried anything to make better.   Pt is early for DM appt. She does request libre for more frequent testing without blood sticks. She is compliant with medications.   .. Active Ambulatory Problems    Diagnosis Date Noted  . Tobacco dependence 05/31/2014  . Abnormal weight gain 05/31/2014  . Obese 05/31/2014  . Essential hypertension, benign 05/31/2014  . Diabetes mellitus without complication ([place]) 99991111  . Menopausal symptoms 05/31/2014  . Hidradenitis suppurativa 12/13/2014  . CKD (chronic kidney disease) stage 3, GFR 30-59 ml/min 12/16/2014  . Class 1 obesity due to excess calories with serious comorbidity and body mass index (BMI) of 34.0 to 34.9 in adult 03/21/2015  . Depression 03/21/2015  . Microalbuminuria due to type 2 diabetes mellitus ([place]) 08/25/2015  . Allergic rhinitis due to allergen 08/08/2017  . Upper back pain 05/19/2018  . Benign paroxysmal positional vertigo due to bilateral vestibular disorder 05/19/2018  . Mixed hyperlipidemia 05/22/2018  . Dyslipidemia, goal LDL below 70 05/22/2018  . DDD (degenerative disc disease), cervical 04/04/2019  . Cervical radiculopathy 04/09/2019  . Type 2 diabetes mellitus with chronic kidney disease, without long-term current use of insulin ([place]) 06/25/2019  . Acute pain of both knees 06/25/2019   Resolved Ambulatory Problems    Diagnosis Date Noted  . No Resolved Ambulatory  Problems   Past Medical History:  Diagnosis Date  . Chronic kidney disease   . Hypertension       Review of Systems See HPI.     Objective:   Physical Exam Vitals reviewed.  Constitutional:      Appearance: Normal appearance.  HENT:     Head: Normocephalic.  Cardiovascular:     Rate and Rhythm: Normal rate and regular rhythm.  Pulmonary:     Effort: Pulmonary effort is normal.  Musculoskeletal:     Comments: Left hand grip 4/5.  Tenderness over cervical spine.  NROM of left shoulder/arm.  Pain improves with left arm abducted.   No joint tenderness tenderness just below lateral knee both sides with some swelling. No warmth or redness.  NROM.   Neurological:     General: No focal deficit present.     Mental Status: She is alert and oriented to person, place, and time.  Psychiatric:        Mood and Affect: Mood normal.           Assessment & Plan:  Marland KitchenMarland KitchenDiagnoses and all orders for this visit:  DDD (degenerative disc disease), cervical -     Ambulatory referral to Orthopedic Surgery  Cervical radiculopathy -     Ambulatory referral to Orthopedic Surgery  Acute pain of both knees -     DG Knee Bilateral Standing AP  Type 2 diabetes mellitus with chronic kidney disease, without long-term current use of insulin, unspecified CKD stage ([place]) -  Continuous Blood Gluc Sensor (FREESTYLE LIBRE 14 DAY SENSOR) MISC; 1 Device by Does not apply route every 14 (fourteen) days. -     Continuous Blood Gluc Receiver (FREESTYLE LIBRE 14 DAY READER) DEVI; 1 applicator by Does not apply route every 14 (fourteen) days. -     Hemoglobin A1c  Other orders -     triamcinolone cream (KENALOG) 0.1 %; Apply 1 application topically 2 (two) times daily.   Too soon for A1C.  Ordered to have done in next 3 weeks.  Freestyle libre ordered. For better testing.   Bilateral knee xrays ordered today. unusual for arthritis to hit suddenly both knees. No trauma. Discussed ICE, no NSAIDs  due to CKD, use voltaren. Start tumeric. Seems more like a bursitis.   Symptoms sound like cervical radiculopathy. Referral made to ortho at patients request. Needs MRI can order from there. Tried predisone helped some but temporary.

## 2019-06-20 NOTE — Patient Instructions (Addendum)
Tumeric 500mg  twice a day.  Will get in with orthopedist.

## 2019-06-20 NOTE — Telephone Encounter (Signed)
Appointment has been made. No further questions at this time.  

## 2019-06-21 NOTE — Progress Notes (Signed)
Nina Trujillo,   Xrays of knee show no significant arthritis. It would be unusual to have cartilage issues in boths knees at the exact same. I suspect this is more some inflammation around the bursa of both knees. I will make referral to ortho for both neck and knees.

## 2019-06-25 ENCOUNTER — Encounter: Payer: Self-pay | Admitting: Physician Assistant

## 2019-06-25 DIAGNOSIS — E1122 Type 2 diabetes mellitus with diabetic chronic kidney disease: Secondary | ICD-10-CM | POA: Insufficient documentation

## 2019-06-25 DIAGNOSIS — M25561 Pain in right knee: Secondary | ICD-10-CM | POA: Insufficient documentation

## 2019-06-28 ENCOUNTER — Other Ambulatory Visit: Payer: Self-pay | Admitting: Physician Assistant

## 2019-06-28 DIAGNOSIS — M5412 Radiculopathy, cervical region: Secondary | ICD-10-CM

## 2019-07-04 ENCOUNTER — Ambulatory Visit: Payer: BC Managed Care – PPO | Admitting: Physician Assistant

## 2019-07-19 ENCOUNTER — Other Ambulatory Visit: Payer: Self-pay | Admitting: Physician Assistant

## 2019-07-19 DIAGNOSIS — I10 Essential (primary) hypertension: Secondary | ICD-10-CM

## 2019-07-24 ENCOUNTER — Other Ambulatory Visit: Payer: Self-pay | Admitting: Physician Assistant

## 2019-07-24 DIAGNOSIS — E119 Type 2 diabetes mellitus without complications: Secondary | ICD-10-CM

## 2019-08-14 DIAGNOSIS — N1831 Chronic kidney disease, stage 3a: Secondary | ICD-10-CM | POA: Diagnosis not present

## 2019-08-14 DIAGNOSIS — E1122 Type 2 diabetes mellitus with diabetic chronic kidney disease: Secondary | ICD-10-CM | POA: Diagnosis not present

## 2019-08-14 DIAGNOSIS — Z09 Encounter for follow-up examination after completed treatment for conditions other than malignant neoplasm: Secondary | ICD-10-CM | POA: Diagnosis not present

## 2019-08-14 DIAGNOSIS — G629 Polyneuropathy, unspecified: Secondary | ICD-10-CM | POA: Diagnosis not present

## 2019-08-14 DIAGNOSIS — E1121 Type 2 diabetes mellitus with diabetic nephropathy: Secondary | ICD-10-CM | POA: Diagnosis not present

## 2019-08-14 DIAGNOSIS — M542 Cervicalgia: Secondary | ICD-10-CM | POA: Diagnosis not present

## 2019-08-20 ENCOUNTER — Other Ambulatory Visit: Payer: Self-pay | Admitting: Physician Assistant

## 2019-08-20 DIAGNOSIS — E119 Type 2 diabetes mellitus without complications: Secondary | ICD-10-CM

## 2019-08-20 DIAGNOSIS — E6609 Other obesity due to excess calories: Secondary | ICD-10-CM

## 2019-09-05 DIAGNOSIS — M436 Torticollis: Secondary | ICD-10-CM | POA: Diagnosis not present

## 2019-09-05 DIAGNOSIS — G629 Polyneuropathy, unspecified: Secondary | ICD-10-CM | POA: Diagnosis not present

## 2019-09-05 DIAGNOSIS — M542 Cervicalgia: Secondary | ICD-10-CM | POA: Diagnosis not present

## 2019-09-05 DIAGNOSIS — R293 Abnormal posture: Secondary | ICD-10-CM | POA: Diagnosis not present

## 2019-09-10 ENCOUNTER — Other Ambulatory Visit: Payer: Self-pay | Admitting: Neurology

## 2019-09-10 MED ORDER — DEXCOM G6 SENSOR MISC: 11 refills | Status: DC

## 2019-09-10 MED ORDER — DEXCOM G6 RECEIVER DEVI: 1.0000 | Freq: Four times a day (QID) | 11 refills | Status: DC | PRN

## 2019-09-10 NOTE — Telephone Encounter (Signed)
Note from pharmacy Freestyle not covered by insurance, to send Dexcom. RX sent.

## 2019-09-11 ENCOUNTER — Other Ambulatory Visit: Payer: Self-pay | Admitting: Neurology

## 2019-09-11 ENCOUNTER — Telehealth: Payer: Self-pay | Admitting: Physician Assistant

## 2019-09-11 MED ORDER — DEXCOM G6 RECEIVER DEVI: 1.0000 | Freq: Four times a day (QID) | 0 refills | Status: DC | PRN

## 2019-09-11 NOTE — Telephone Encounter (Signed)
Received fax for PA on Dexcom G6 Received device and sensors sent through cover my meds and received authorization. - CF

## 2019-09-17 DIAGNOSIS — M542 Cervicalgia: Secondary | ICD-10-CM | POA: Diagnosis not present

## 2019-09-17 DIAGNOSIS — M436 Torticollis: Secondary | ICD-10-CM | POA: Diagnosis not present

## 2019-09-17 DIAGNOSIS — G629 Polyneuropathy, unspecified: Secondary | ICD-10-CM | POA: Diagnosis not present

## 2019-09-17 DIAGNOSIS — R293 Abnormal posture: Secondary | ICD-10-CM | POA: Diagnosis not present

## 2019-09-28 DIAGNOSIS — R293 Abnormal posture: Secondary | ICD-10-CM | POA: Diagnosis not present

## 2019-09-28 DIAGNOSIS — M436 Torticollis: Secondary | ICD-10-CM | POA: Diagnosis not present

## 2019-09-28 DIAGNOSIS — M542 Cervicalgia: Secondary | ICD-10-CM | POA: Diagnosis not present

## 2019-09-28 DIAGNOSIS — G629 Polyneuropathy, unspecified: Secondary | ICD-10-CM | POA: Diagnosis not present

## 2019-10-01 ENCOUNTER — Telehealth: Payer: Self-pay | Admitting: Physician Assistant

## 2019-10-01 NOTE — Telephone Encounter (Signed)
Received fax for PA on Dexcom G6 Received sent through cover my meds and received authorization.   Valid: 10/01/19 - 09/29/20 - Cf

## 2019-10-05 DIAGNOSIS — M542 Cervicalgia: Secondary | ICD-10-CM | POA: Diagnosis not present

## 2019-10-05 DIAGNOSIS — G629 Polyneuropathy, unspecified: Secondary | ICD-10-CM | POA: Diagnosis not present

## 2019-10-05 DIAGNOSIS — R293 Abnormal posture: Secondary | ICD-10-CM | POA: Diagnosis not present

## 2019-10-05 DIAGNOSIS — M436 Torticollis: Secondary | ICD-10-CM | POA: Diagnosis not present

## 2019-10-12 ENCOUNTER — Other Ambulatory Visit: Payer: Self-pay | Admitting: Physician Assistant

## 2019-10-12 DIAGNOSIS — E119 Type 2 diabetes mellitus without complications: Secondary | ICD-10-CM

## 2019-10-12 DIAGNOSIS — G629 Polyneuropathy, unspecified: Secondary | ICD-10-CM | POA: Diagnosis not present

## 2019-10-12 DIAGNOSIS — M542 Cervicalgia: Secondary | ICD-10-CM | POA: Diagnosis not present

## 2019-10-12 DIAGNOSIS — R293 Abnormal posture: Secondary | ICD-10-CM | POA: Diagnosis not present

## 2019-10-12 DIAGNOSIS — M436 Torticollis: Secondary | ICD-10-CM | POA: Diagnosis not present

## 2019-10-17 DIAGNOSIS — M542 Cervicalgia: Secondary | ICD-10-CM | POA: Diagnosis not present

## 2019-10-17 DIAGNOSIS — R293 Abnormal posture: Secondary | ICD-10-CM | POA: Diagnosis not present

## 2019-10-17 DIAGNOSIS — M436 Torticollis: Secondary | ICD-10-CM | POA: Diagnosis not present

## 2019-10-17 DIAGNOSIS — G629 Polyneuropathy, unspecified: Secondary | ICD-10-CM | POA: Diagnosis not present

## 2019-10-24 ENCOUNTER — Other Ambulatory Visit: Payer: Self-pay | Admitting: Physician Assistant

## 2019-10-24 DIAGNOSIS — I10 Essential (primary) hypertension: Secondary | ICD-10-CM

## 2019-10-24 DIAGNOSIS — F330 Major depressive disorder, recurrent, mild: Secondary | ICD-10-CM

## 2019-11-14 ENCOUNTER — Ambulatory Visit (INDEPENDENT_AMBULATORY_CARE_PROVIDER_SITE_OTHER): Payer: BC Managed Care – PPO | Admitting: Physician Assistant

## 2019-11-14 ENCOUNTER — Other Ambulatory Visit: Payer: Self-pay | Admitting: Physician Assistant

## 2019-11-14 ENCOUNTER — Encounter: Payer: Self-pay | Admitting: Physician Assistant

## 2019-11-14 DIAGNOSIS — Z5329 Procedure and treatment not carried out because of patient's decision for other reasons: Secondary | ICD-10-CM

## 2019-11-14 NOTE — Progress Notes (Signed)
No show

## 2019-11-15 MED ORDER — DEXCOM G6 RECEIVER DEVI: 1.0000 | Freq: Four times a day (QID) | 0 refills | Status: DC | PRN

## 2019-11-15 NOTE — Telephone Encounter (Signed)
Rx written by historical provider. Rx pended.  

## 2019-11-16 ENCOUNTER — Other Ambulatory Visit: Payer: Self-pay

## 2019-11-16 ENCOUNTER — Ambulatory Visit (INDEPENDENT_AMBULATORY_CARE_PROVIDER_SITE_OTHER): Payer: BC Managed Care – PPO | Admitting: Physician Assistant

## 2019-11-16 VITALS — BP 115/62 | HR 80 | Ht 66.0 in | Wt 216.0 lb

## 2019-11-16 DIAGNOSIS — R140 Abdominal distension (gaseous): Secondary | ICD-10-CM

## 2019-11-16 DIAGNOSIS — N951 Menopausal and female climacteric states: Secondary | ICD-10-CM

## 2019-11-16 DIAGNOSIS — E1122 Type 2 diabetes mellitus with diabetic chronic kidney disease: Secondary | ICD-10-CM

## 2019-11-16 DIAGNOSIS — E66811 Obesity, class 1: Secondary | ICD-10-CM

## 2019-11-16 DIAGNOSIS — E119 Type 2 diabetes mellitus without complications: Secondary | ICD-10-CM

## 2019-11-16 DIAGNOSIS — F330 Major depressive disorder, recurrent, mild: Secondary | ICD-10-CM

## 2019-11-16 DIAGNOSIS — Z6834 Body mass index (BMI) 34.0-34.9, adult: Secondary | ICD-10-CM

## 2019-11-16 DIAGNOSIS — E782 Mixed hyperlipidemia: Secondary | ICD-10-CM | POA: Diagnosis not present

## 2019-11-16 DIAGNOSIS — R1013 Epigastric pain: Secondary | ICD-10-CM

## 2019-11-16 DIAGNOSIS — E6609 Other obesity due to excess calories: Secondary | ICD-10-CM

## 2019-11-16 DIAGNOSIS — M542 Cervicalgia: Secondary | ICD-10-CM

## 2019-11-16 DIAGNOSIS — I10 Essential (primary) hypertension: Secondary | ICD-10-CM

## 2019-11-16 LAB — POCT GLYCOSYLATED HEMOGLOBIN (HGB A1C): Hemoglobin A1C: 6.8 % — AB (ref 4.0–5.6)

## 2019-11-16 LAB — POCT UA - MICROALBUMIN
Albumin/Creatinine Ratio, Urine, POC: <30
Creatinine, POC: 200 mg/dL
Microalbumin Ur, POC: 10 mg/L

## 2019-11-16 MED ORDER — PHENTERMINE HCL 15 MG PO CAPS: 15.0000 mg | ORAL_CAPSULE | ORAL | 0 refills | Status: DC

## 2019-11-16 MED ORDER — METFORMIN HCL 500 MG PO TABS: 1000.0000 mg | ORAL_TABLET | Freq: Every day | ORAL | 0 refills | Status: DC

## 2019-11-16 MED ORDER — ESTRADIOL 2 MG PO TABS: 2.0000 mg | ORAL_TABLET | Freq: Every day | ORAL | 3 refills | Status: DC

## 2019-11-16 MED ORDER — BUPROPION HCL ER (SR) 150 MG PO TB12: 150.0000 mg | ORAL_TABLET | Freq: Two times a day (BID) | ORAL | 1 refills | Status: DC

## 2019-11-16 MED ORDER — HYDROCHLOROTHIAZIDE 25 MG PO TABS: ORAL_TABLET | ORAL | 3 refills | Status: DC

## 2019-11-16 MED ORDER — ATORVASTATIN CALCIUM 20 MG PO TABS: 20.0000 mg | ORAL_TABLET | Freq: Every day | ORAL | 3 refills | Status: DC

## 2019-11-16 MED ORDER — METFORMIN HCL 1000 MG PO TABS: 1000.0000 mg | ORAL_TABLET | Freq: Two times a day (BID) | ORAL | 3 refills | Status: DC

## 2019-11-16 NOTE — Patient Instructions (Signed)
If sugars below 90 in the morning to decrease every 5 days by 2 units until sugars in the morning are above 90.  Will refer to bariatric clinic.  Get labs.

## 2019-11-16 NOTE — Progress Notes (Addendum)
Subjective:    Patient ID: Nina Trujillo, female    DOB: [birth year], 54 y.o.   MRN: [id number]  HPI  Pt is a 54 yo obese female with type 2 diabetes, hyperlipidemia, hypertension who presents to the clinic for 59-month follow-up.  Patient is taking Metformin, Ozempic, Tresiba around 13 units at bedtime.  Patient is not checking her sugars regularly but it time she has noticed her fasting glucose below 90 in the mornings.  She is not having much success with weight loss.  In fact she is gaining and feels bloated all the time.  She denies any regular exercise.  She denies any specific diet recently but has tried weight loss diets in the past and she seemed to lose and then gained back.  She is very frustrated and would like a referral.  She requests phentermine today.  She continues to feel very bloated in the abdomen and for all the time.  She denies any constipation or diarrhea.  She complains of some epigastric tenderness and pain.  Patient continues to have a lot of neck pain.  She has been going to physical therapy with very little results.  Massage does help some but then the pain comes right back.  She does have some numbness and tingling radiating into her arms and hands.  She denies any strength changes.  .. Active Ambulatory Problems    Diagnosis Date Noted  . Tobacco dependence 05/31/2014  . Abnormal weight gain 05/31/2014  . Obese 05/31/2014  . Essential hypertension, benign 05/31/2014  . Diabetes mellitus without complication ([place]) 86/57/8469  . Menopausal symptoms 05/31/2014  . Hidradenitis suppurativa 12/13/2014  . CKD (chronic kidney disease) stage 3, GFR 30-59 ml/min 12/16/2014  . Class 1 obesity due to excess calories with serious comorbidity and body mass index (BMI) of 34.0 to 34.9 in adult 03/21/2015  . Depression 03/21/2015  . Microalbuminuria due to type 2 diabetes mellitus ([place]) 08/25/2015  . Allergic rhinitis due to allergen 08/08/2017  . Upper back pain  05/19/2018  . Benign paroxysmal positional vertigo due to bilateral vestibular disorder 05/19/2018  . Mixed hyperlipidemia 05/22/2018  . Dyslipidemia, goal LDL below 70 05/22/2018  . DDD (degenerative disc disease), cervical 04/04/2019  . Cervical radiculopathy 04/09/2019  . Type 2 diabetes mellitus with chronic kidney disease, without long-term current use of insulin ([place]) 06/25/2019  . Acute pain of both knees 06/25/2019  . Cervical pain (neck) 11/20/2019  . Bloating 11/20/2019  . Epigastric pain 11/20/2019   Resolved Ambulatory Problems    Diagnosis Date Noted  . No Resolved Ambulatory Problems   Past Medical History:  Diagnosis Date  . Chronic kidney disease   . Hypertension             Review of Systems  All other systems reviewed and are negative.      Objective:   Physical Exam Vitals reviewed.  Constitutional:      Appearance: Normal appearance. She is obese.  Cardiovascular:     Rate and Rhythm: Normal rate and regular rhythm.     Pulses: Normal pulses.     Heart sounds: Normal heart sounds.  Pulmonary:     Effort: Pulmonary effort is normal.     Breath sounds: Normal breath sounds.  Neurological:     General: No focal deficit present.     Mental Status: She is alert and oriented to person, place, and time.  Psychiatric:        Mood and Affect: Mood  normal.           Assessment & Plan:  Marland KitchenMarland KitchenKaterina was seen today for diabetes.  Diagnoses and all orders for this visit:  Type 2 diabetes mellitus with chronic kidney disease, without long-term current use of insulin, unspecified CKD stage (HCC) -     POCT UA - Microalbumin -     POCT glycosylated hemoglobin (Hb A1C) -     COMPLETE METABOLIC PANEL WITH GFR -     metFORMIN (GLUCOPHAGE) 1000 MG tablet; Take 1 tablet (1,000 mg total) by mouth 2 (two) times daily with a meal. -     Amb Referral to Bariatric Surgery  Mixed hyperlipidemia -     atorvastatin (LIPITOR) 20 MG tablet; Take 1 tablet (20  mg total) by mouth daily. -     Lipid Panel w/reflex Direct LDL -     Amb Referral to Bariatric Surgery  Mild episode of recurrent major depressive disorder (HCC) -     buPROPion (WELLBUTRIN SR) 150 MG 12 hr tablet; Take 1 tablet (150 mg total) by mouth 2 (two) times daily.  Menopausal symptoms -     estradiol (ESTRACE) 2 MG tablet; Take 1 tablet (2 mg total) by mouth daily.  Essential hypertension, benign -     hydrochlorothiazide (HYDRODIURIL) 25 MG tablet; TAKE 1 TABLET BY MOUTH DAILY. -     Amb Referral to Bariatric Surgery  Class 1 obesity due to excess calories with serious comorbidity and body mass index (BMI) of 34.0 to 34.9 in adult -     metFORMIN (GLUCOPHAGE) 1000 MG tablet; Take 1 tablet (1,000 mg total) by mouth 2 (two) times daily with a meal. -     TSH -     phentermine 15 MG capsule; Take 1 capsule (15 mg total) by mouth every morning. -     Amb Referral to Bariatric Surgery  Diabetes mellitus without complication (HCC) -     Semaglutide, 1 MG/DOSE, (OZEMPIC, 1 MG/DOSE,) 2 MG/1.5ML SOPN; Inject 0.75 mLs (1 mg total) into the skin once a week.  Cervical pain (neck)  Epigastric pain -     US Abdomen Complete  Bloating -     US Abdomen Complete    Lab Results  Component Value Date   HGBA1C 6.8 (A) 11/16/2019   A1C under 7.  Continue same treatment plan.  Metformin refilled.  ozempic refilled.  Continue tresbia but if sugars below 90 make sure to decrease by 2 units until fasting are 90-130.  Discussed diet. On STATIN. Ordered lipid.  Micro normal.  BP to goal.  Foot exam/eye/vaccines UTD.  folllow up in 3 months.   Pt is concerned about weight. She has tried diet and exercise with no improvement over many years. If she loses even a little she then gains it back. She request phentermine again. Will add with topamax.  She is on GLP-1. She would like referral.    Refilled medications today.  Suggest her to see sports medicine for her neck  pain.  Declined smoking cessation.    Follow up in 3 months.

## 2019-11-20 ENCOUNTER — Encounter: Payer: Self-pay | Admitting: Physician Assistant

## 2019-11-20 DIAGNOSIS — R140 Abdominal distension (gaseous): Secondary | ICD-10-CM | POA: Insufficient documentation

## 2019-11-20 DIAGNOSIS — R1013 Epigastric pain: Secondary | ICD-10-CM | POA: Insufficient documentation

## 2019-11-20 DIAGNOSIS — M542 Cervicalgia: Secondary | ICD-10-CM | POA: Insufficient documentation

## 2019-11-20 MED ORDER — OZEMPIC (1 MG/DOSE) 2 MG/1.5ML ~~LOC~~ SOPN: 1.0000 mg | PEN_INJECTOR | SUBCUTANEOUS | 2 refills | Status: DC

## 2019-11-20 NOTE — Addendum Note (Signed)
Addended by: Donella Stade on: 11/20/2019 04:12 PM   Modules accepted: Orders

## 2019-11-21 ENCOUNTER — Other Ambulatory Visit: Payer: Self-pay | Admitting: Physician Assistant

## 2019-11-21 DIAGNOSIS — E119 Type 2 diabetes mellitus without complications: Secondary | ICD-10-CM

## 2019-11-22 ENCOUNTER — Encounter: Payer: Self-pay | Admitting: Physician Assistant

## 2019-11-23 ENCOUNTER — Encounter: Payer: Self-pay | Admitting: Sports Medicine

## 2019-11-23 ENCOUNTER — Ambulatory Visit (INDEPENDENT_AMBULATORY_CARE_PROVIDER_SITE_OTHER): Payer: BC Managed Care – PPO | Admitting: Sports Medicine

## 2019-11-23 DIAGNOSIS — G5603 Carpal tunnel syndrome, bilateral upper limbs: Secondary | ICD-10-CM

## 2019-11-23 DIAGNOSIS — G959 Disease of spinal cord, unspecified: Secondary | ICD-10-CM | POA: Diagnosis not present

## 2019-11-23 DIAGNOSIS — G939 Disorder of brain, unspecified: Secondary | ICD-10-CM | POA: Diagnosis not present

## 2019-11-23 DIAGNOSIS — M5030 Other cervical disc degeneration, unspecified cervical region: Secondary | ICD-10-CM | POA: Diagnosis not present

## 2019-11-23 MED ORDER — MELOXICAM 15 MG PO TABS: ORAL_TABLET | ORAL | 3 refills | Status: DC

## 2019-11-23 MED ORDER — GABAPENTIN 300 MG PO CAPS: ORAL_CAPSULE | ORAL | 3 refills | Status: DC

## 2019-11-23 NOTE — Assessment & Plan Note (Addendum)
This is a very pleasant 54 year old female with a long history of neck pain, axial, with radiation down the left arm. She has done formal physical therapy, steroids, still having discomfort. We are going to go ahead and proceed with MRI, I am going to add gabapentin, meloxicam. Return to see me to go over MRI results.  There does appear to be abnormal signal in the left hemispinal cord, this could be from significant bulging disks, I would like her to touch base with spine surgery considering the degree of spinal cord compression and signal in the cord itself, but we also need to add an additional MRI of the C-spine and brain with IV contrast.  Ordering this now.

## 2019-11-23 NOTE — Progress Notes (Addendum)
    Procedures performed today:    None.  Independent interpretation of notes and tests performed by another provider:   None.  Brief History, Exam, Impression, and Recommendations:    DDD (degenerative disc disease), cervical This is a very pleasant 54 year old female with a long history of neck pain, axial, with radiation down the left arm. She has done formal physical therapy, steroids, still having discomfort. We are going to go ahead and proceed with MRI, I am going to add gabapentin, meloxicam. Return to see me to go over MRI results.  There does appear to be abnormal signal in the left hemispinal cord, this could be from significant bulging disks, I would like her to touch base with spine surgery considering the degree of spinal cord compression and signal in the cord itself, but we also need to add an additional MRI of the C-spine and brain with IV contrast.  Ordering this now.  Carpal tunnel syndrome, bilateral I think Nina Trujillo is also having some carpal tunnel syndrome, she has nocturnal numbness and tingling into her hands and fingertips, predominantly third and fourth fingers. She has a positive Tinel's sign, Phalen sign, she has tried night splinting without much improvement. Adding carpal tunnel rehab exercises, gabapentin, median nerve hydrodissection if no better.   Brain lesion T2 hyperintense lesions seen on cervical spine I recommended brain MRI with and without contrast for further evaluation of demyelinating disease.    ___________________________________________ Gwen Her. Dianah Field, M.D., ABFM., CAQSM. Primary Care and [place] Instructor of [place] [employer] [facility] [employer]

## 2019-11-23 NOTE — Assessment & Plan Note (Signed)
I think Nina Trujillo is also having some carpal tunnel syndrome, she has nocturnal numbness and tingling into her hands and fingertips, predominantly third and fourth fingers. She has a positive Tinel's sign, Phalen sign, she has tried night splinting without much improvement. Adding carpal tunnel rehab exercises, gabapentin, median nerve hydrodissection if no better.

## 2019-11-25 ENCOUNTER — Other Ambulatory Visit: Payer: Self-pay

## 2019-11-25 ENCOUNTER — Ambulatory Visit (INDEPENDENT_AMBULATORY_CARE_PROVIDER_SITE_OTHER): Payer: BC Managed Care – PPO

## 2019-11-25 DIAGNOSIS — M5030 Other cervical disc degeneration, unspecified cervical region: Secondary | ICD-10-CM | POA: Diagnosis not present

## 2019-11-25 IMAGING — MR MR CERVICAL SPINE W/O CM
7 series · 40 of 48 positions shown · non-contrast
Comparison: [DATE] cervical spine radiographs.

CLINICAL DATA: Spinal stenosis, persistent neck pain.

EXAM:
MRI CERVICAL SPINE WITHOUT CONTRAST
TECHNIQUE: Multiplanar, multisequence MR imaging of the cervical spine was
performed. No intravenous contrast was administered.

[Series 2: sag loc · sagittal · 6.0mm · 0.94mm/px · 2 of 3 slices shown]
[im 1/3]
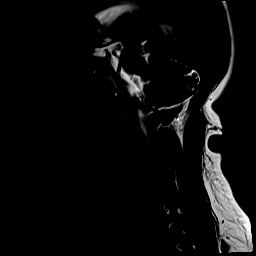
[im 3/3]
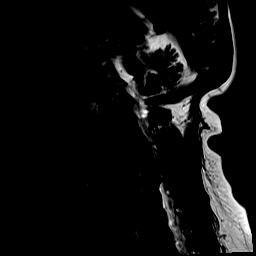

[Series 3: cor loc · coronal · 6.0mm · 0.94mm/px · 2 of 3 slices shown]
[im 1/3]
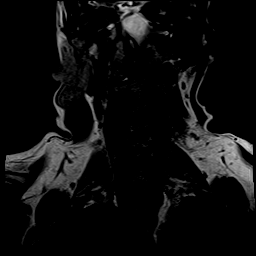
[im 3/3]
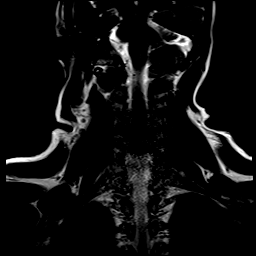

[Series 4: T2 · sagittal · 3.0mm · 0.69mm/px · 6 of 13 slices shown (1 of 2)]
[im 1/13]
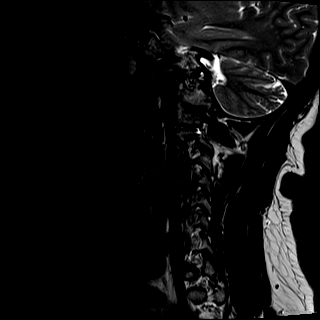
[im 3/13]
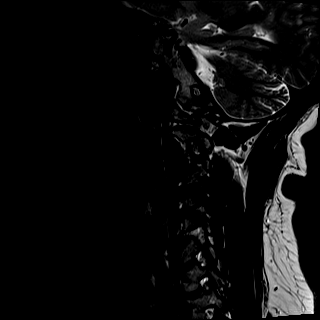
[im 5/13]
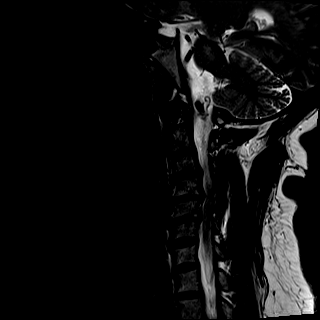
[im 8/13]
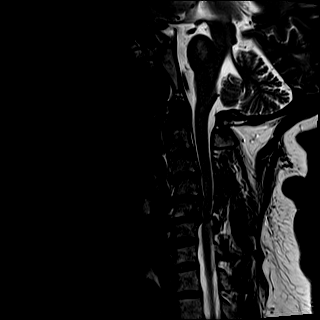
[im 10/13]
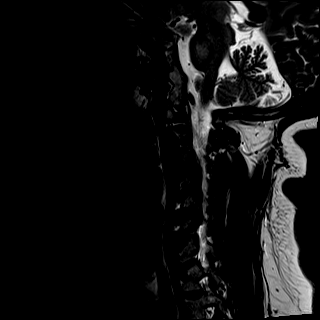
[im 13/13]
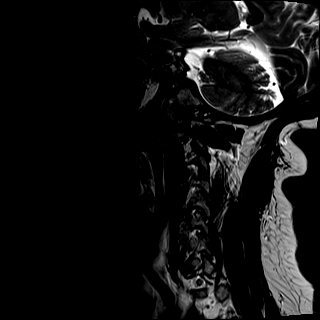

[Series 5: T1 · sagittal · 3.0mm · 0.86mm/px · 6 of 13 slices shown]
[im 1/13]
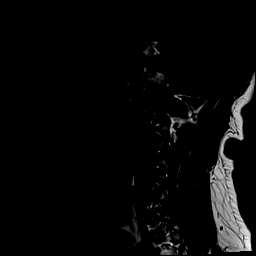
[im 3/13]
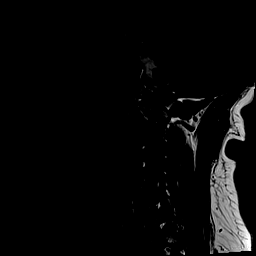
[im 5/13]
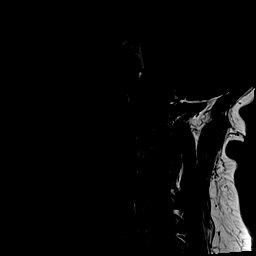
[im 8/13]
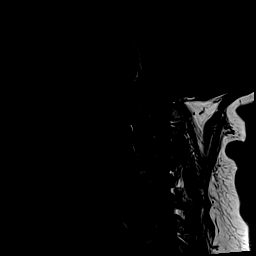
[im 10/13]
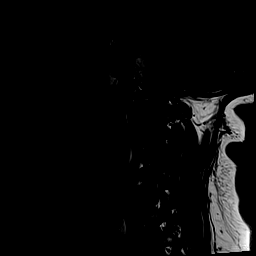
[im 13/13]
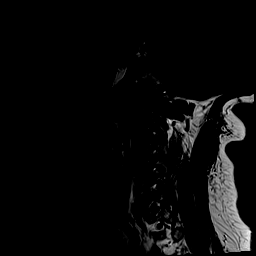

[Series 6: STIR · sagittal · 3.0mm · 0.69mm/px · 6 of 13 slices shown]
[im 1/13]
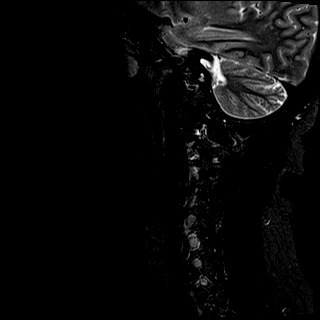
[im 3/13]
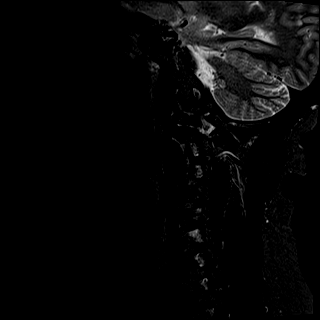
[im 5/13]
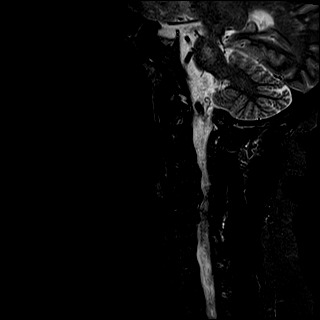
[im 8/13]
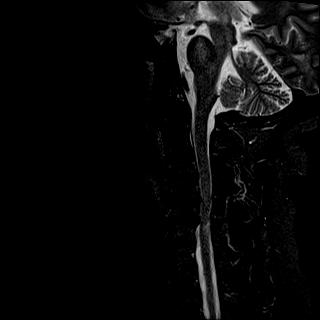
[im 10/13]
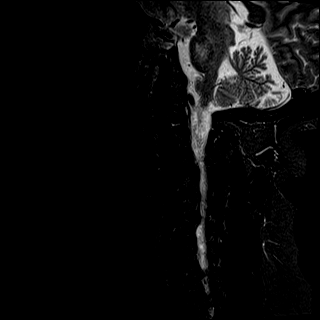
[im 13/13]
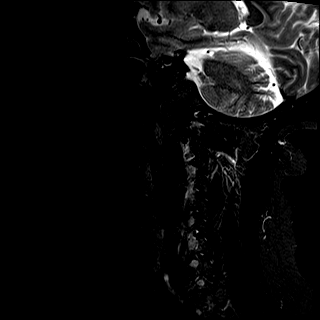

[Series 7: T2 · axial · 3.0mm · 0.62mm/px · z∈[-76,+22]mm · 10 of 27 slices shown (2 of 2)]
[im 1/27]
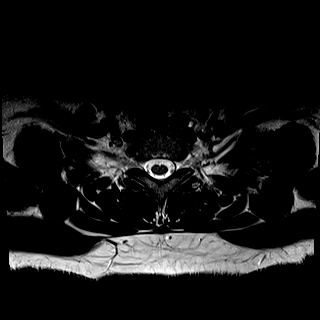
[im 3/27]
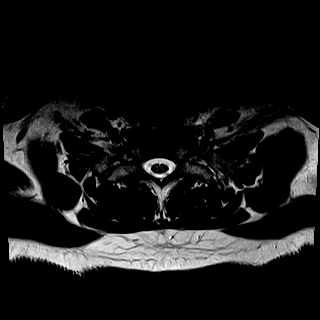
[im 5/27]
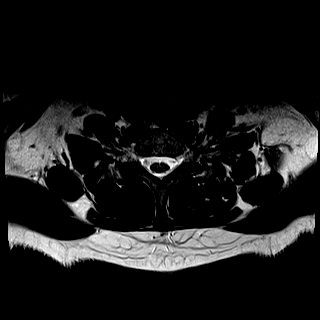
[im 9/27]
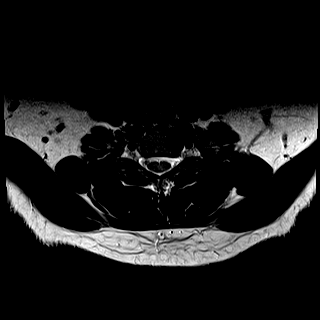
[im 11/27]
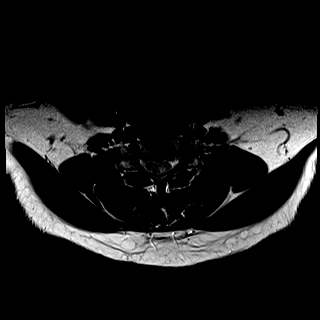
[im 14/27]
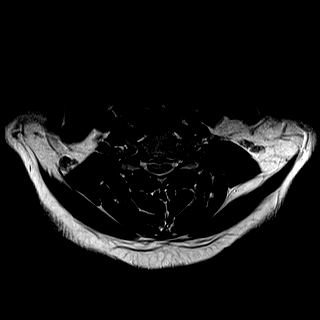
[im 16/27]
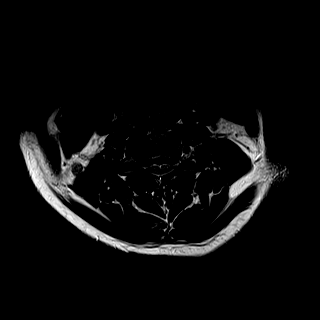
[im 18/27]
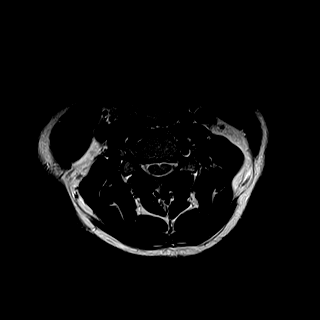
[im 22/27]
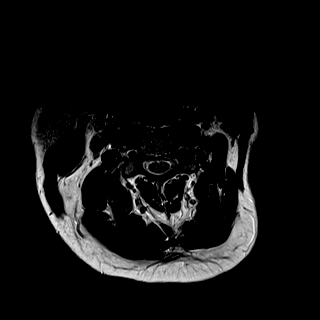
[im 27/27]
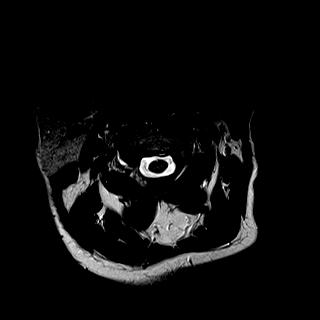

[Series 8: mpgr ax · axial · 3.0mm · 0.35mm/px · z∈[-67,+31]mm · 8 of 27 slices shown]
[im 1/27]
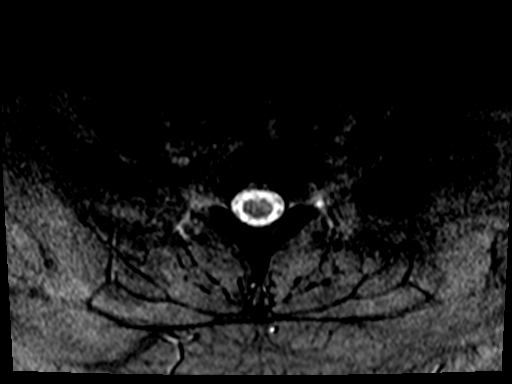
[im 5/27]
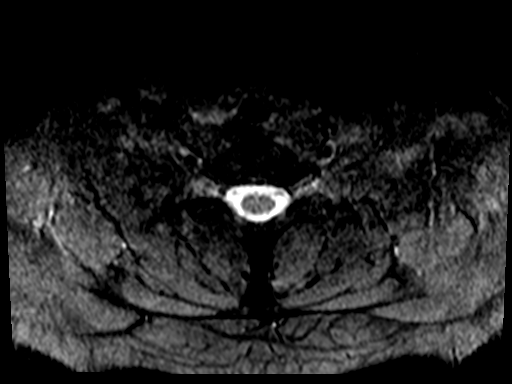
[im 9/27]
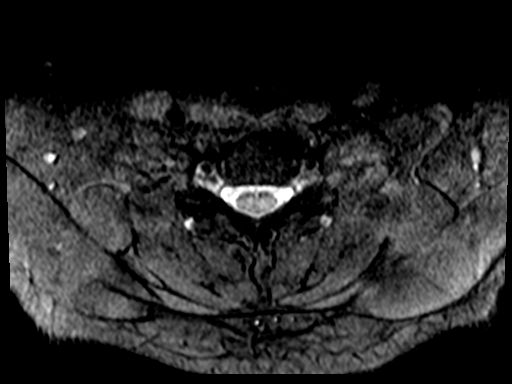
[im 11/27]
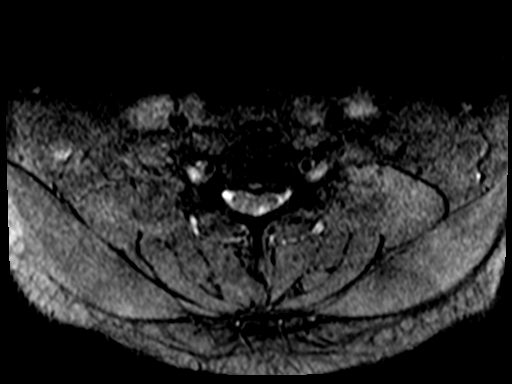
[im 16/27]
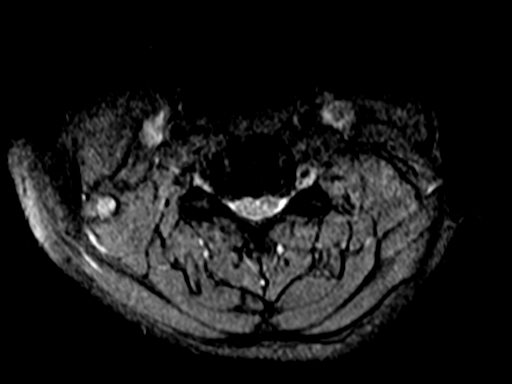
[im 18/27]
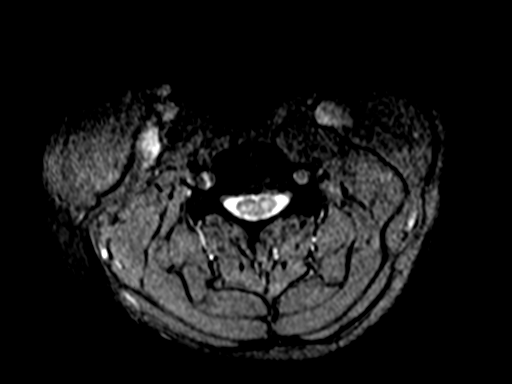
[im 22/27]
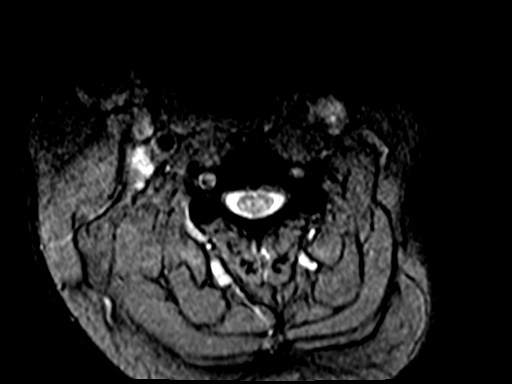
[im 27/27]
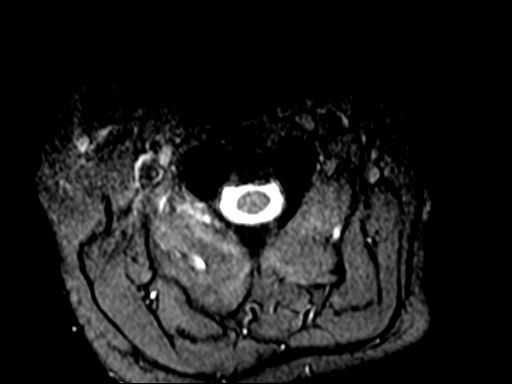

[40 of 48 positions shown; findings below may reference images not displayed]

FINDINGS: Alignment: Straightening of cervical lordosis with slight reversal.
Minimal grade 1 C2-3 and C3-4 anterolisthesis.

Vertebrae: Normal bone marrow signal intensity. No focal osseous
lesion.

Cord: T2 hyperintense signal involving the dorsal left hemicord at
the C5-6 level ([DATE]).

Posterior Fossa, vertebral arteries: Ill-defined T2 hyperintense
signal involving the midbrain and pons. The vertebral arteries are
within normal limits.

Disc levels: Multilevel desiccation with mild disc space loss most
prominent at the C3-6 levels.

C2-3: Mild disc bulge and bilateral uncovertebral degenerative
spurring. Mild spinal canal narrowing. No significant neural
foraminal narrowing.

C3-4: Disc bulge with uncovertebral and bilateral facet hypertrophy.
Mild spinal canal and bilateral neural foraminal narrowing.

C4-5: Disc bulge with superimposed central protrusion abutting the
ventral cord, uncovertebral and bilateral facet hypertrophy.
Moderate spinal canal, severe right and moderate left neural
foraminal narrowing.

C5-6: Disc bulge with superimposed central protrusion, uncovertebral
and bilateral facet hypertrophy. Moderate spinal canal and bilateral
neural foraminal narrowing.

C6-7: Minimal disc bulge. No significant spinal canal or neural
foraminal narrowing.

C7-T1: No significant disc bulge, spinal canal or neural foraminal
narrowing.

Paraspinal tissues: Negative.
IMPRESSION: Moderate spinal canal and moderate to severe neural foraminal
narrowing at the C4-5 and C5-6 levels.

Left hemicord T2 hyperintensity at the C6 level may reflect
demyelinating lesion, less likely myelomalacia. Consider
postcontrast imaging for further evaluation.

Midbrain and pontine T2 hyperintensities may reflect chronic
microvascular ischemic changes versus demyelinating foci. Consider
dedicated MRI brain with and without contrast for further
evaluation.

## 2019-11-26 DIAGNOSIS — G379 Demyelinating disease of central nervous system, unspecified: Secondary | ICD-10-CM | POA: Insufficient documentation

## 2019-11-26 NOTE — Addendum Note (Signed)
Addended by: Silverio Decamp on: 11/26/2019 04:35 PM   Modules accepted: Orders

## 2019-11-26 NOTE — Assessment & Plan Note (Signed)
T2 hyperintense lesions seen on cervical spine I recommended brain MRI with and without contrast for further evaluation of demyelinating disease.

## 2019-11-30 ENCOUNTER — Ambulatory Visit (INDEPENDENT_AMBULATORY_CARE_PROVIDER_SITE_OTHER): Payer: BC Managed Care – PPO | Admitting: Sports Medicine

## 2019-11-30 ENCOUNTER — Other Ambulatory Visit: Payer: Self-pay

## 2019-11-30 DIAGNOSIS — G379 Demyelinating disease of central nervous system, unspecified: Secondary | ICD-10-CM | POA: Diagnosis not present

## 2019-11-30 DIAGNOSIS — G939 Disorder of brain, unspecified: Secondary | ICD-10-CM | POA: Diagnosis not present

## 2019-11-30 NOTE — Assessment & Plan Note (Addendum)
Nina Trujillo returns, she is a pleasant 54 year old female with a strong family history of multiple sclerosis in her father and sister. We noted subcortical white matter changes in her parietal and occipital lobe on the cervical spine the right, she also had some pontine T2 hyperintensities. For this reason we are proceeding with dedicated brain and cervical spine MRI with contrast. Of note she has not yet started the gabapentin so her above orthopedic processes have not yet changed.  Update based on brain and contrast cervical MRI: There is certainly demyelinating disease in the cervical spine and the brain, I would like her to follow-up with neurology for another opinion.  No obvious or visualized tumors.

## 2019-11-30 NOTE — Progress Notes (Addendum)
    Procedures performed today:    None.  Independent interpretation of notes and tests performed by another provider:   None.  Brief History, Exam, Impression, and Recommendations:    Demyelinating disease of central nervous system [facility]) Nina Trujillo returns, she is a pleasant 54 year old female with a strong family history of multiple sclerosis in her father and sister. We noted subcortical white matter changes in her parietal and occipital lobe on the cervical spine the right, she also had some pontine T2 hyperintensities. For this reason we are proceeding with dedicated brain and cervical spine MRI with contrast. Of note she has not yet started the gabapentin so her above orthopedic processes have not yet changed.  Update based on brain and contrast cervical MRI: There is certainly demyelinating disease in the cervical spine and the brain, I would like her to follow-up with neurology for another opinion.  No obvious or visualized tumors.    ___________________________________________ Gwen Her. Dianah Field, M.D., ABFM., CAQSM. Primary Care and [place] Instructor of [place] [employer] [facility] [employer]

## 2019-12-03 ENCOUNTER — Ambulatory Visit (INDEPENDENT_AMBULATORY_CARE_PROVIDER_SITE_OTHER): Payer: BC Managed Care – PPO

## 2019-12-03 ENCOUNTER — Ambulatory Visit (HOSPITAL_BASED_OUTPATIENT_CLINIC_OR_DEPARTMENT_OTHER)
Admission: RE | Admit: 2019-12-03 | Discharge: 2019-12-03 | Disposition: A | Discharge status: Home / Self Care | Payer: BC Managed Care – PPO | Source: Ambulatory Visit | Attending: Physician Assistant | Admitting: Physician Assistant

## 2019-12-03 ENCOUNTER — Other Ambulatory Visit: Payer: Self-pay

## 2019-12-03 DIAGNOSIS — R140 Abdominal distension (gaseous): Secondary | ICD-10-CM | POA: Diagnosis not present

## 2019-12-03 DIAGNOSIS — G939 Disorder of brain, unspecified: Secondary | ICD-10-CM

## 2019-12-03 DIAGNOSIS — G9389 Other specified disorders of brain: Secondary | ICD-10-CM | POA: Diagnosis not present

## 2019-12-03 DIAGNOSIS — R9082 White matter disease, unspecified: Secondary | ICD-10-CM | POA: Diagnosis not present

## 2019-12-03 DIAGNOSIS — G959 Disease of spinal cord, unspecified: Secondary | ICD-10-CM

## 2019-12-03 DIAGNOSIS — M5030 Other cervical disc degeneration, unspecified cervical region: Secondary | ICD-10-CM

## 2019-12-03 DIAGNOSIS — R1013 Epigastric pain: Secondary | ICD-10-CM | POA: Insufficient documentation

## 2019-12-03 DIAGNOSIS — M4312 Spondylolisthesis, cervical region: Secondary | ICD-10-CM | POA: Diagnosis not present

## 2019-12-03 DIAGNOSIS — D7389 Other diseases of spleen: Secondary | ICD-10-CM | POA: Diagnosis not present

## 2019-12-03 DIAGNOSIS — G379 Demyelinating disease of central nervous system, unspecified: Secondary | ICD-10-CM | POA: Diagnosis not present

## 2019-12-03 IMAGING — US US ABDOMEN COMPLETE
2 series · 14 of 25 positions shown · non-contrast
Comparison: None.

CLINICAL DATA: Epigastric pain with bloating

EXAM:
ABDOMEN ULTRASOUND COMPLETE

[Series 1: us abdomen complete · 13 of 54 slices shown (1 of 2)]
[im 1/54]
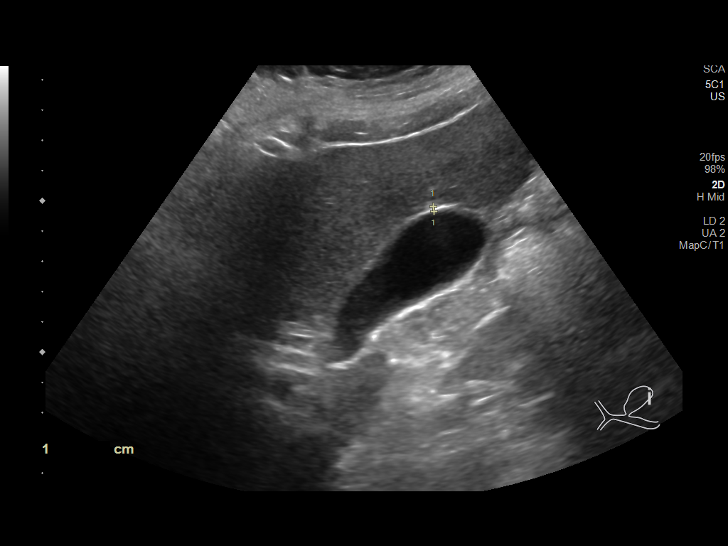
[im 5/54]
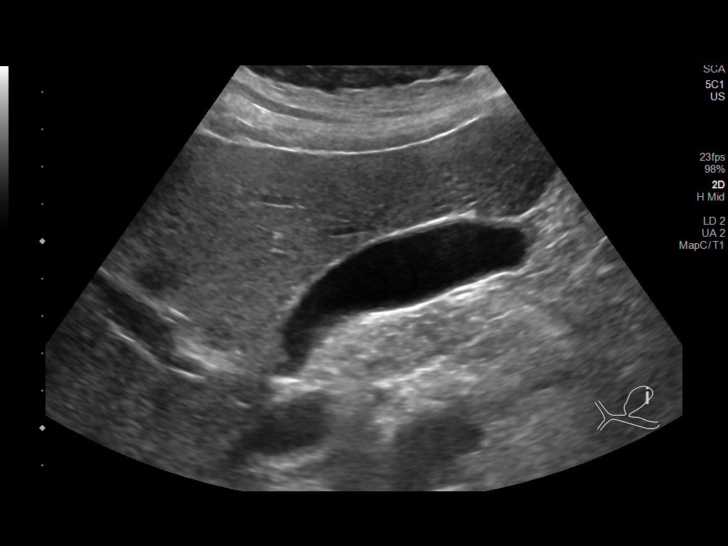
[im 10/54]
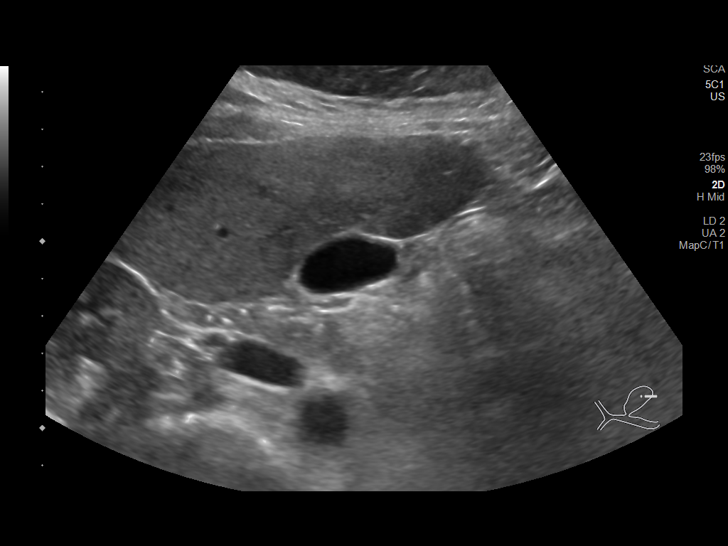
[im 14/54]
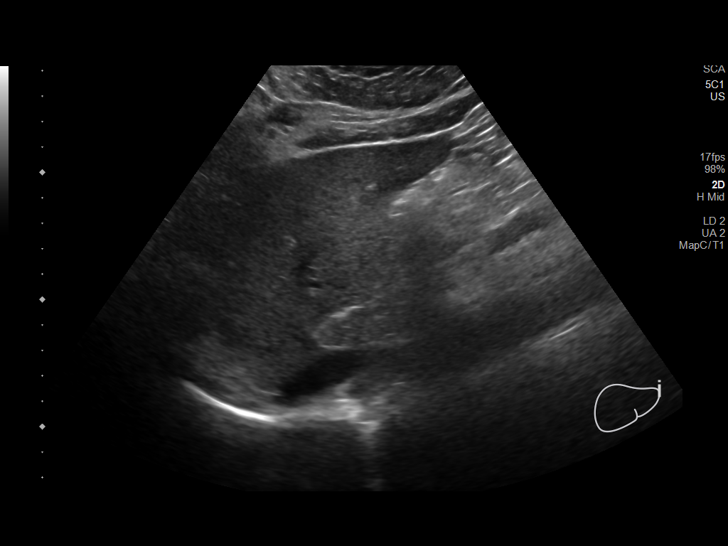
[im 19/54]
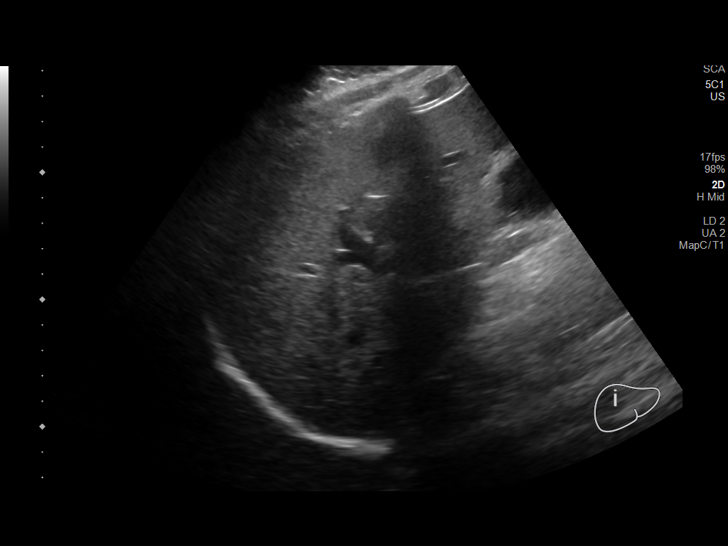
[im 21/54]
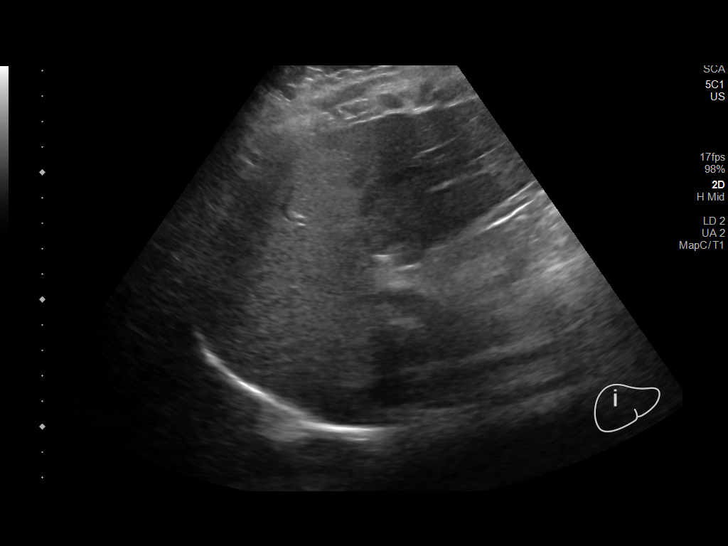
[im 26/54]
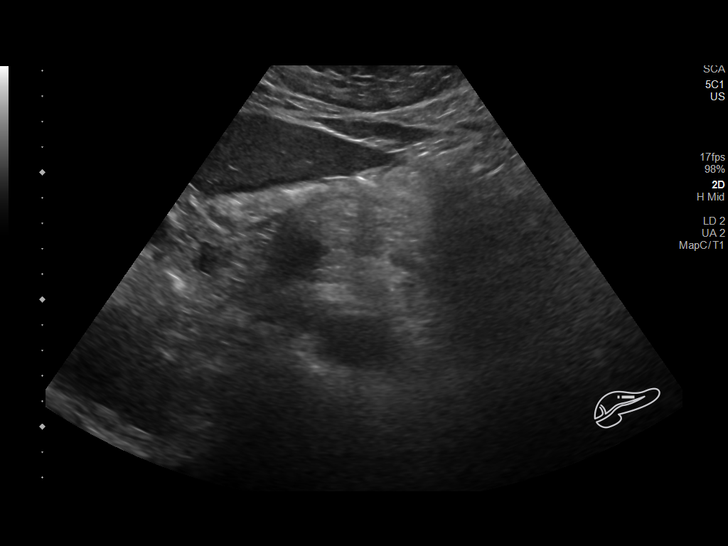
[im 30/54]
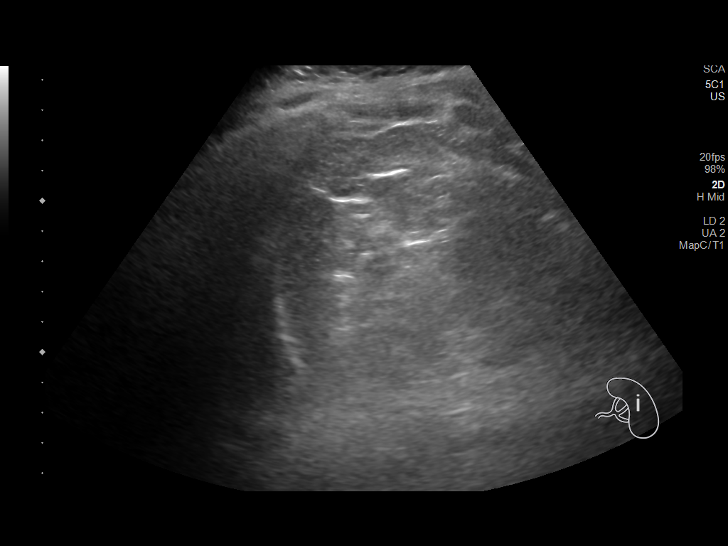
[im 35/54]
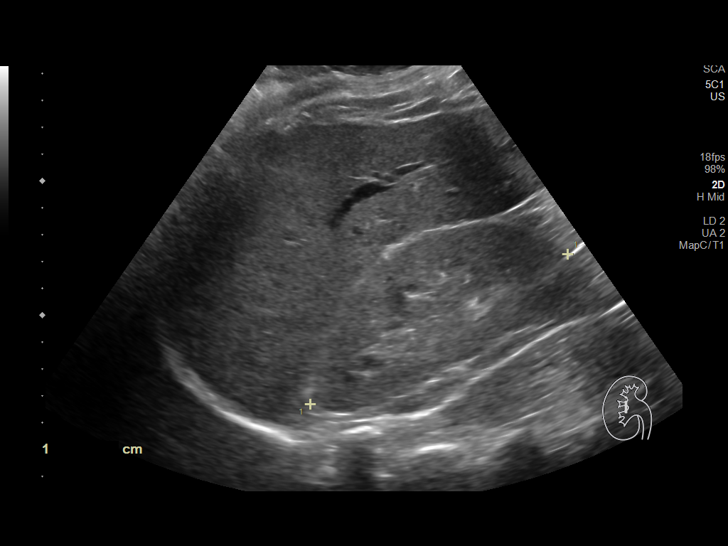
[im 37/54]
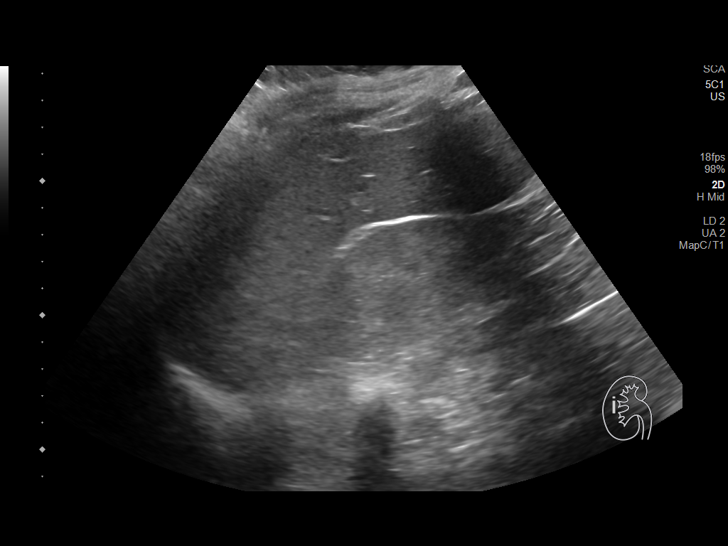
[im 42/54]
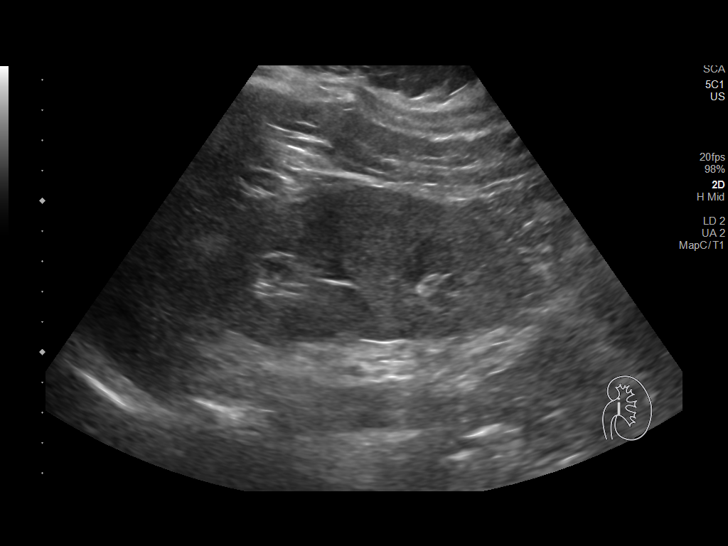
[im 47/54]
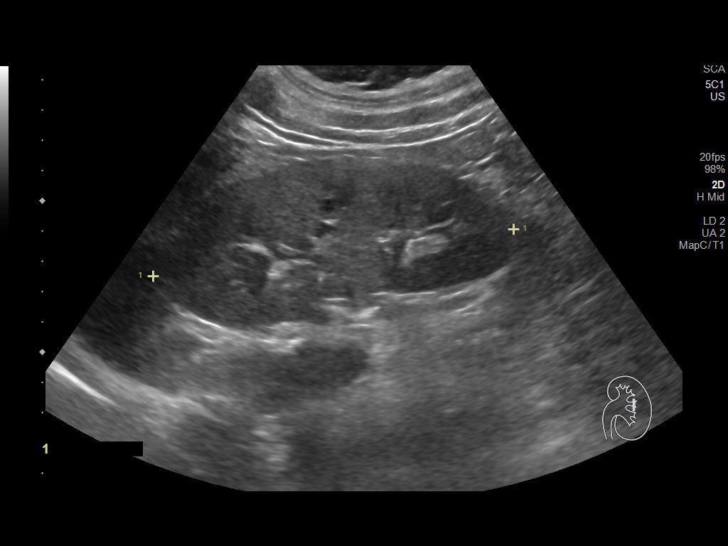
[im 51/54]
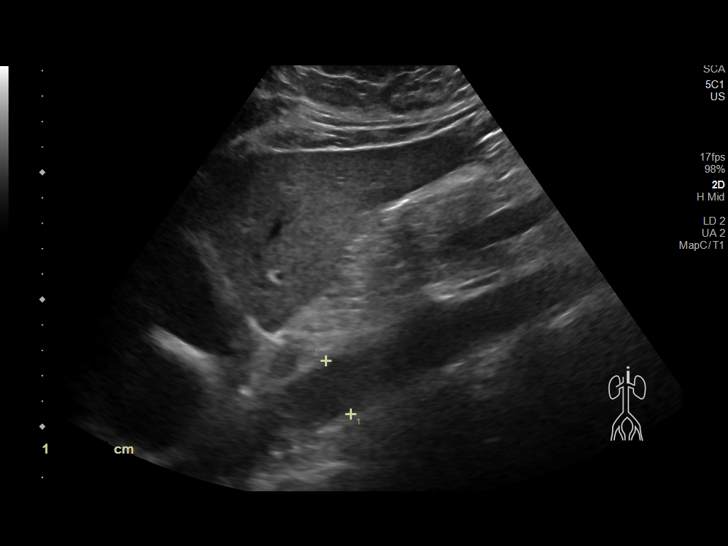

[Series 2: us abdomen complete · 1 of 2 slices shown (2 of 2)]
[im 1/2]
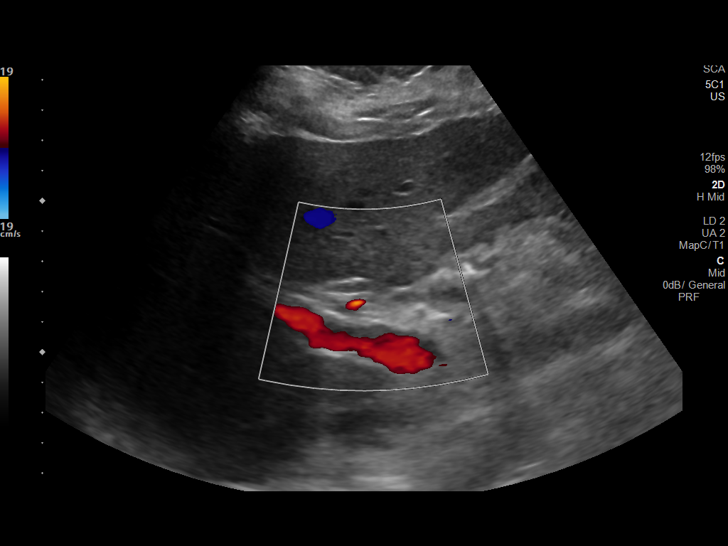

[14 of 25 positions shown; findings below may reference images not displayed]

FINDINGS: Gallbladder: No gallstones or wall thickening visualized. No
sonographic Murphy sign noted by sonographer.

Common bile duct: Diameter: 1.9 mm

Liver: No focal lesion identified. Within normal limits in
parenchymal echogenicity. Portal vein is patent on color Doppler
imaging with normal direction of blood flow towards the liver.

IVC: No abnormality visualized.

Pancreas: Visualized portion unremarkable.

Spleen: Appears small and possibly atrophic measuring 2.7 cm.

Right Kidney: Length: 11 cm. Echogenicity within normal limits. No
mass or hydronephrosis visualized.

Left Kidney: Length: 12 cm. Echogenicity within normal limits. No
mass or hydronephrosis visualized.

Abdominal aorta: No aneurysm visualized.

Other findings: None.
IMPRESSION: 1. Small appearing spleen potentially due to atrophy
2. Otherwise negative examination

## 2019-12-03 IMAGING — MR MR HEAD WO/W CM
13 series · 48 of 48 positions shown · IV contrast (gadavist)
Comparison: MRI cervical spine without contrast [DATE]. MRI of
the cervical spine with contrast [DATE]

CLINICAL DATA: Demyelinating disease. Abnormal MRI of the cervical
spine.

EXAM:
MRI HEAD WITHOUT AND WITH CONTRAST
TECHNIQUE: Multiplanar, multiecho pulse sequences of the brain and surrounding
structures were obtained without and with intravenous contrast.
CONTRAST:  10mL GADAVIST GADOBUTROL 1 MMOL/ML IV SOLN

[Series 3: DWI · axial · 3.0mm · 1.20mm/px · z∈[-48,+106]mm · 7 of 109 slices shown (1 of 4)]
[im 1/109]
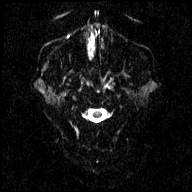
[im 19/109]
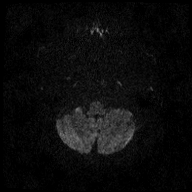
[im 37/109]
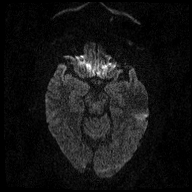
[im 55/109]
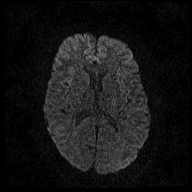
[im 73/109]
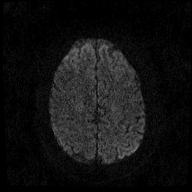
[im 91/109]
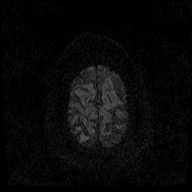
[im 109/109]
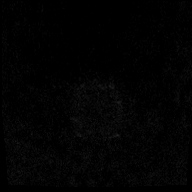

[Series 4: DWI · axial · 3.0mm · 1.20mm/px · z∈[-48,+106]mm · 3 of 55 slices shown (2 of 4)]
[im 1/55]
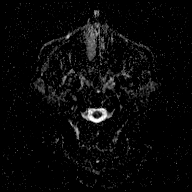
[im 28/55]
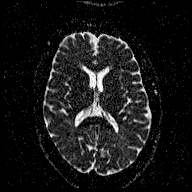
[im 55/55]
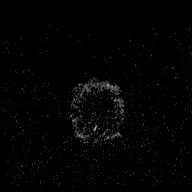

[Series 5: DWI · coronal · 3.0mm · 1.15mm/px · 5 of 93 slices shown (3 of 4)]
[im 1/93]
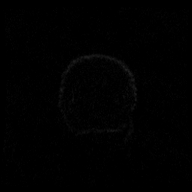
[im 24/93]
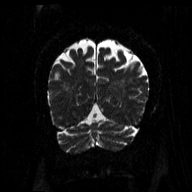
[im 47/93]
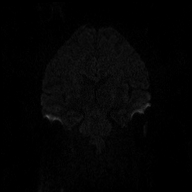
[im 70/93]
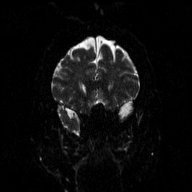
[im 93/93]
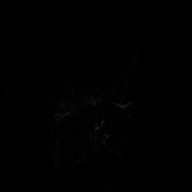

[Series 6: DWI · coronal · 3.0mm · 1.15mm/px · 3 of 48 slices shown (4 of 4)]
[im 1/48]
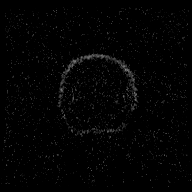
[im 24/48]
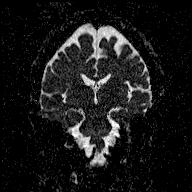
[im 48/48]
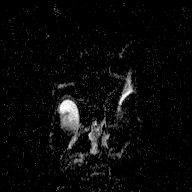

[Series 7: T1 · sagittal · 5.0mm · 0.47mm/px · 1 of 24 slices shown (1 of 2)]
[im 1/24]
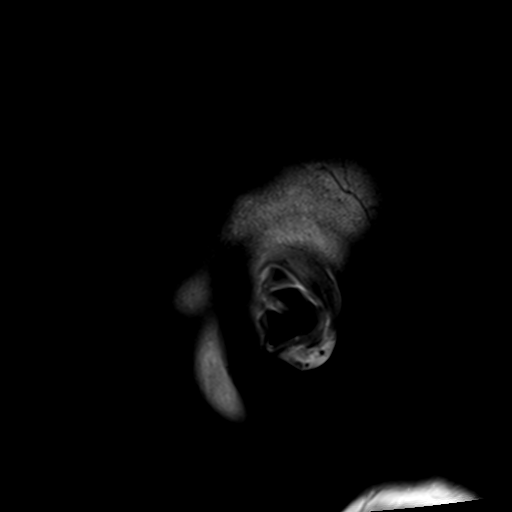

[Series 8: FLAIR · axial · 3.0mm · 0.45mm/px · z∈[-48,+105]mm · 3 of 55 slices shown (1 of 2)]
[im 1/55]
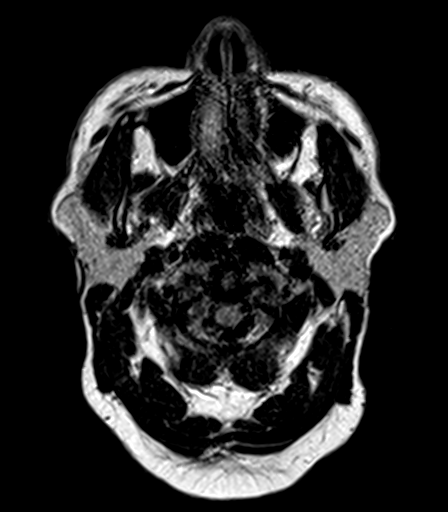
[im 28/55]
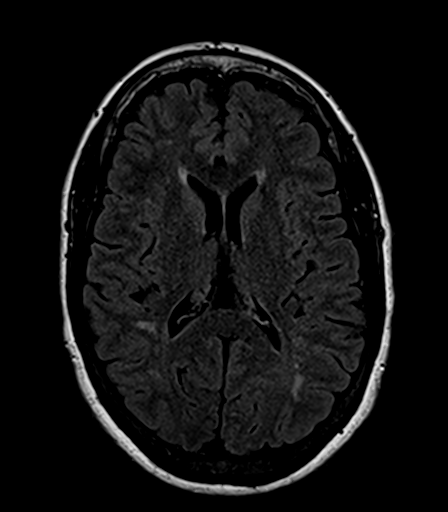
[im 55/55]
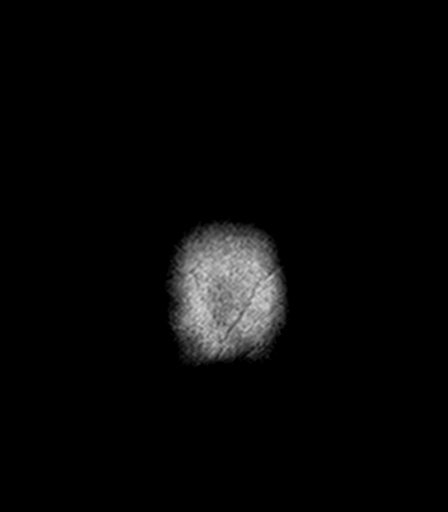

[Series 9: FLAIR · sagittal · 4.0mm · 0.45mm/px · 2 of 30 slices shown (2 of 2)]
[im 1/30]
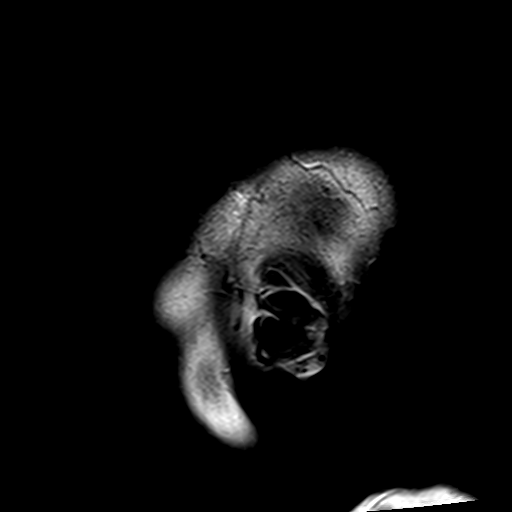
[im 30/30]
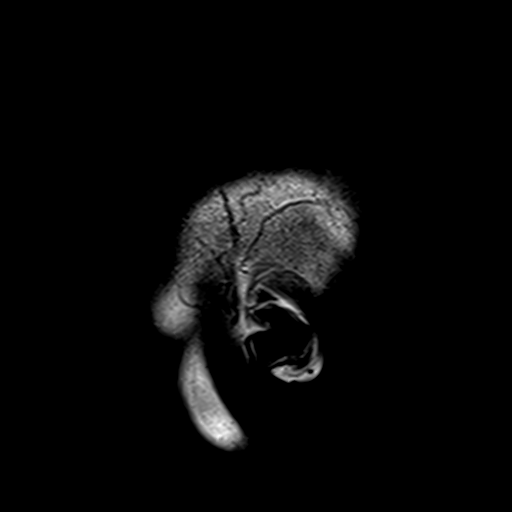

[Series 10: T2 · axial · 5.0mm · 0.72mm/px · 1 of 23 slices shown (1 of 2)]
[im 1/23]
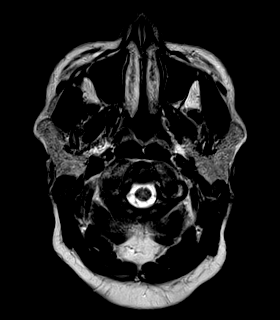

[Series 11: T2 · axial · 5.0mm · 0.72mm/px · 1 of 23 slices shown (2 of 2)]
[im 1/23]
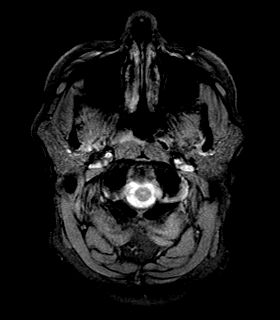

[Series 12: T1 · axial · 1.0mm · 1.00mm/px · z∈[-43,+108]mm · 9 of 160 slices shown (2 of 2)]
[im 1/160]
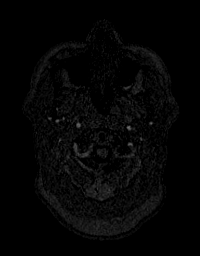
[im 20/160]
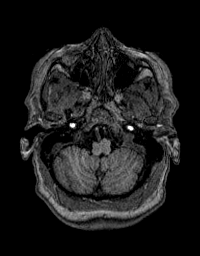
[im 40/160]
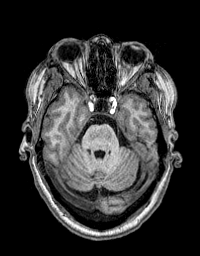
[im 60/160]
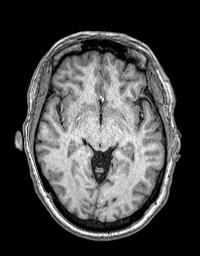
[im 80/160]
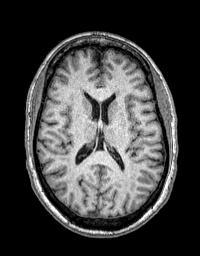
[im 100/160]
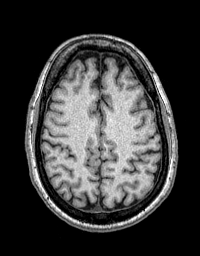
[im 120/160]
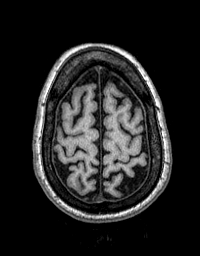
[im 140/160]
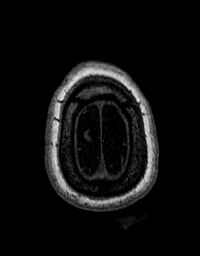
[im 160/160]
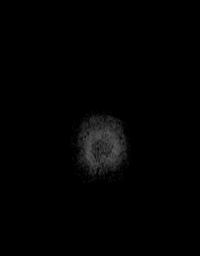

[Series 13: T2 post-contrast · coronal · 5.0mm · 0.43mm/px · 2 of 31 slices shown]
[im 1/31]
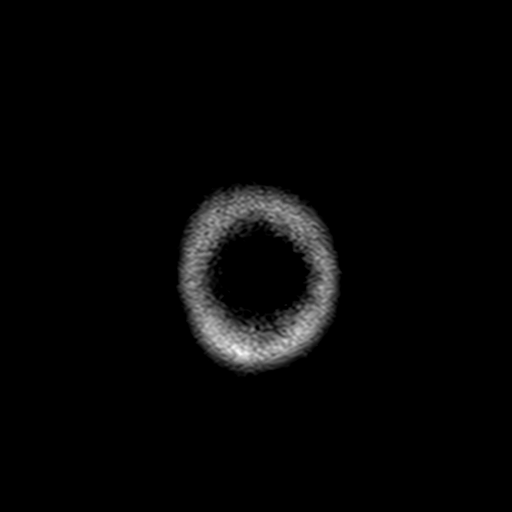
[im 31/31]
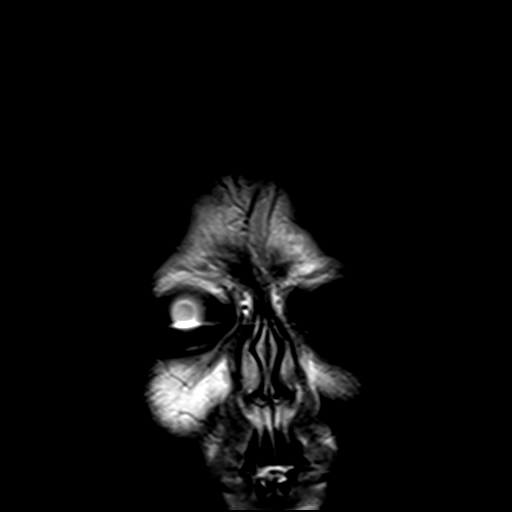

[Series 14: T1 post-contrast · axial · 1.0mm · 1.00mm/px · z∈[-43,+108]mm · 9 of 160 slices shown (1 of 2)]
[im 1/160]
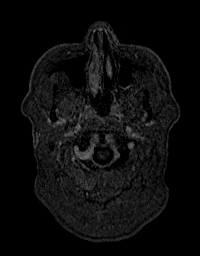
[im 20/160]
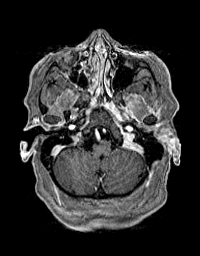
[im 40/160]
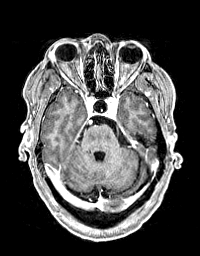
[im 60/160]
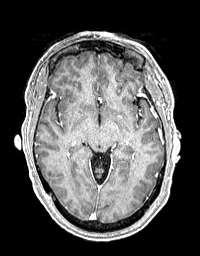
[im 80/160]
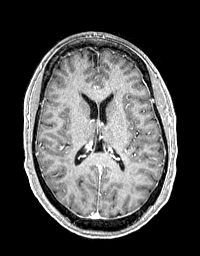
[im 100/160]
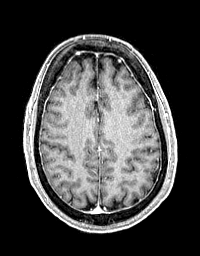
[im 120/160]
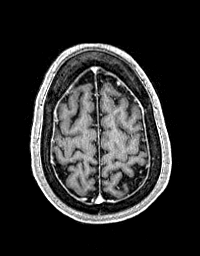
[im 140/160]
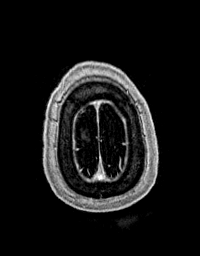
[im 160/160]
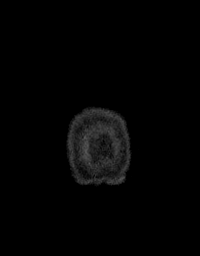

[Series 15: T1 post-contrast · coronal · 5.0mm · 0.43mm/px · 2 of 31 slices shown (2 of 2)]
[im 1/31]
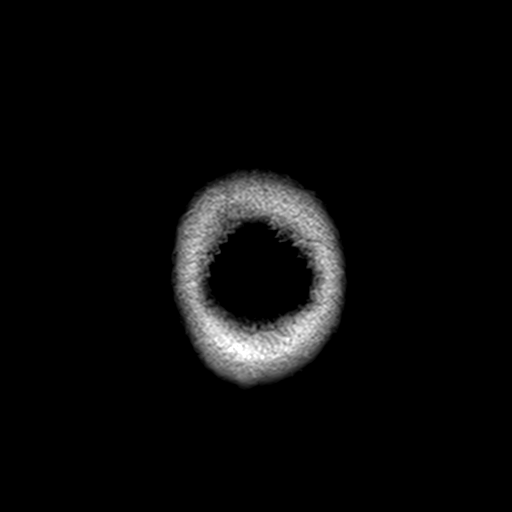
[im 31/31]
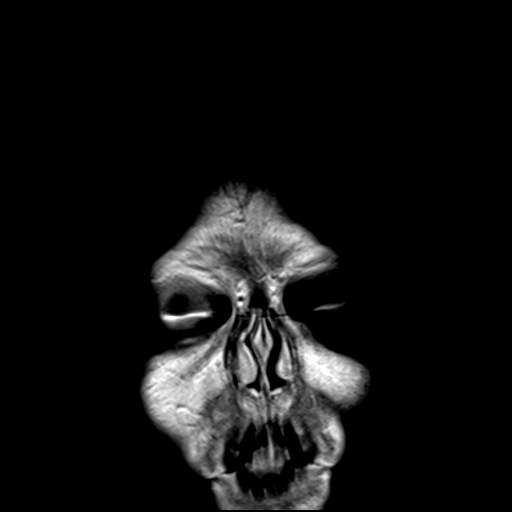

[48 of 48 positions shown; findings below may reference images not displayed]

FINDINGS: Brain: Moderate diffuse, mostly subcortical, white matter T2
hyperintensity is present bilaterally. Diffuse T2 hyperintensity is
present in the central pons. There is moderate disease at the
callososeptal margin, right greater than left. No pathologic
enhancement is associated. No acute infarct, hemorrhage, or mass
lesion is present.

The ventricles are of normal size. No significant extraaxial fluid
collection is present.

The internal auditory canals are within normal limits. The brainstem
and cerebellum are otherwise normal. Basal ganglia are within normal
limits.

Vascular: Flow is present in the major intracranial arteries.

Skull and upper cervical spine: Slight anterolisthesis is again
noted at C2-3 and C3-4. Craniocervical junction is normal. Marrow
signal is normal.

Sinuses/Orbits: The paranasal sinuses and mastoid air cells are
clear. The globes and orbits are within normal limits. No focal
lesions are evident within the optic nerve chiasm.
IMPRESSION: 1. Moderate diffuse, mostly subcortical, and brainstem T2
hyperintensity bilaterally. The finding is nonspecific but can be
seen in the setting of chronic microvascular ischemia, a
demyelinating process such as multiple sclerosis, vasculitis,
complicated migraine headaches, or as the sequelae of a prior
infectious or inflammatory process. Given the finding in the
cervical spine, this most likely represents demyelinating disease to
some degree. Superimposed microvascular ischemic changes likely also
present in this patient with diabetes and chronic kidney disease.
2. No acute intracranial abnormality.

## 2019-12-03 IMAGING — MR MR CERVICAL SPINE W/ CM
4 series · 48 of 48 positions shown · IV contrast (agent unspecified)
Comparison: MRI cervical spine [DATE].

CLINICAL DATA: Myelopathy. Abnormal MRI of the cervical spine.
Intra-axial T2 hyperintensity posteriorly and on the left at C5-6.

EXAM:
MRI CERVICAL SPINE WITH CONTRAST
TECHNIQUE: Multiplanar, multisequence MR imaging of the cervical spine was
performed following the administration of intravenous contrast.

[Series 2: T1 · axial · non-contrast · 3.0mm · 0.78mm/px · z∈[-230,-133]mm · 13 of 27 slices shown]
[im 1/27]
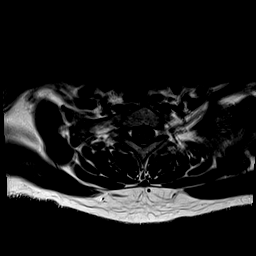
[im 3/27]
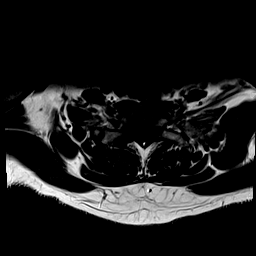
[im 5/27]
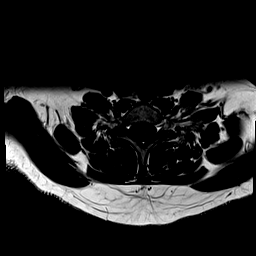
[im 7/27]
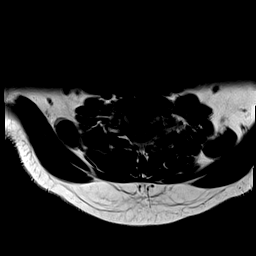
[im 9/27]
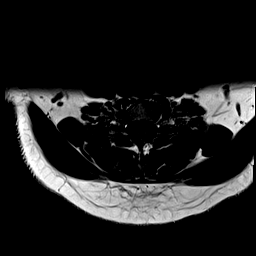
[im 11/27]
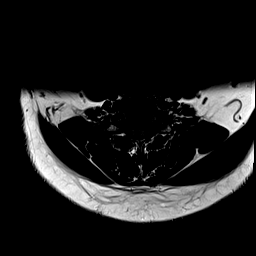
[im 14/27]
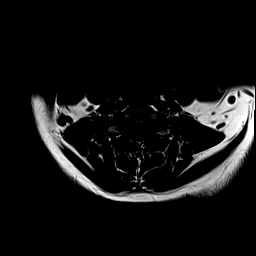
[im 16/27]
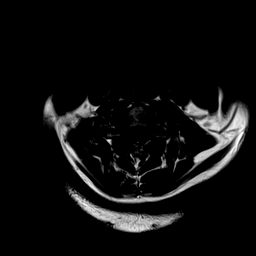
[im 18/27]
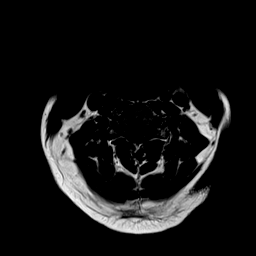
[im 20/27]
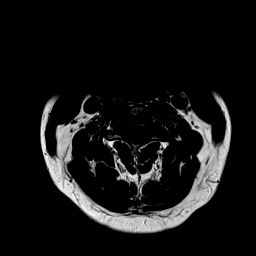
[im 22/27]
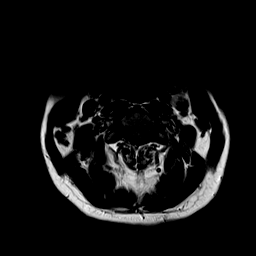
[im 24/27]
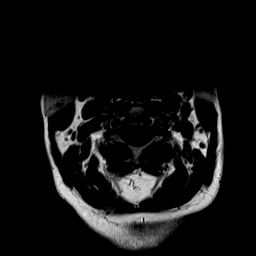
[im 27/27]
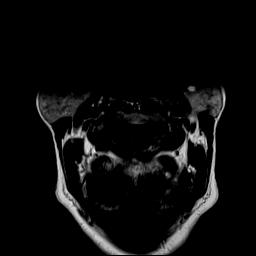

[Series 3: T1 fat-sat post-contrast · sagittal · 3.0mm · 0.69mm/px · 7 of 13 slices shown (1 of 2)]
[im 1/13]
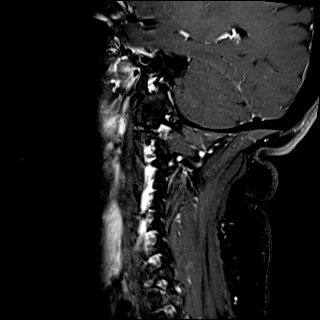
[im 3/13]
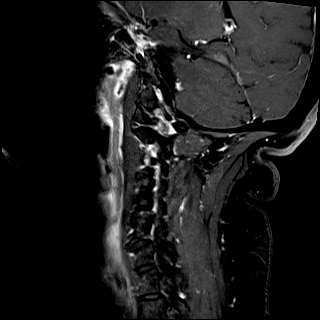
[im 5/13]
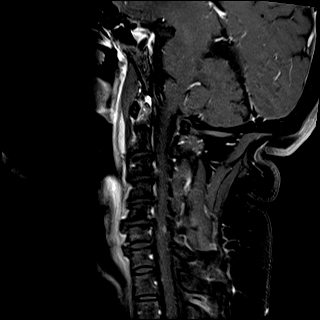
[im 7/13]
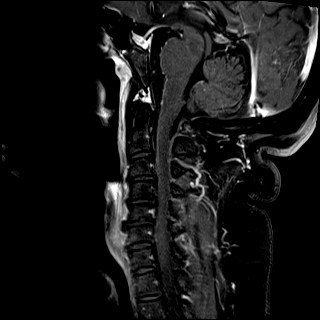
[im 9/13]
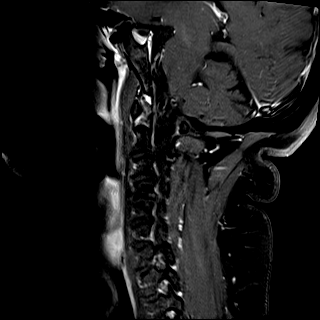
[im 11/13]
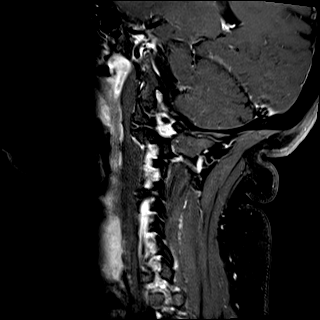
[im 13/13]
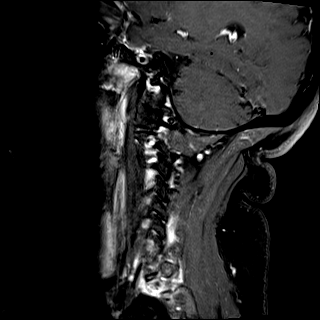

[Series 4: T1 post-contrast · axial · 3.0mm · 0.78mm/px · z∈[-230,-133]mm · 14 of 27 slices shown]
[im 1/27]
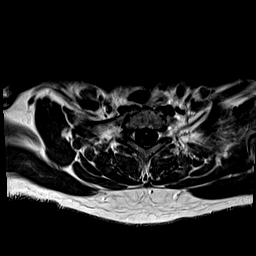
[im 3/27]
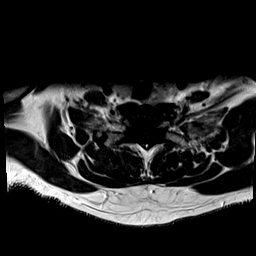
[im 5/27]
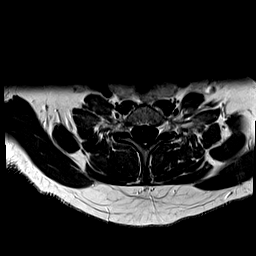
[im 7/27]
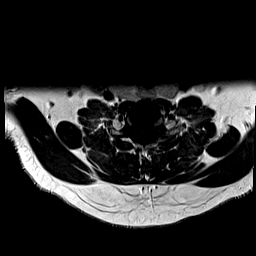
[im 9/27]
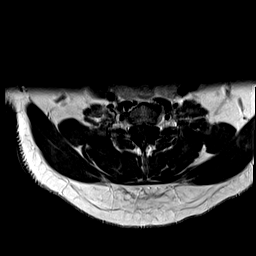
[im 11/27]
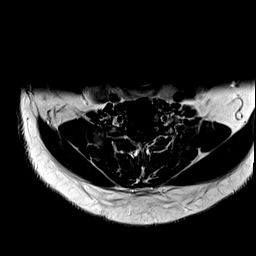
[im 13/27]
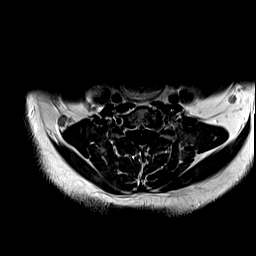
[im 15/27]
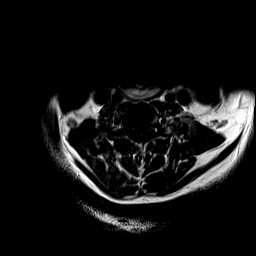
[im 17/27]
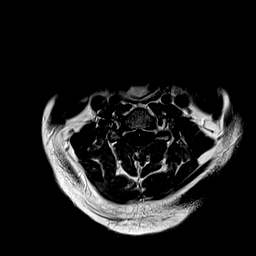
[im 19/27]
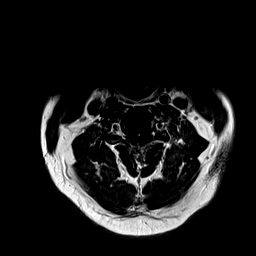
[im 21/27]
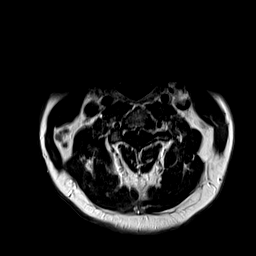
[im 23/27]
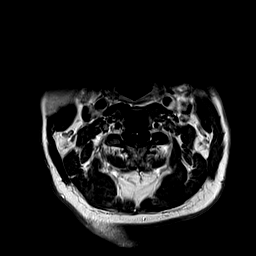
[im 25/27]
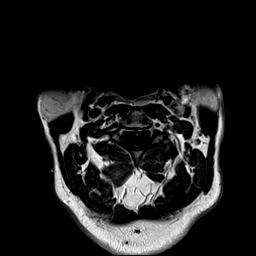
[im 27/27]
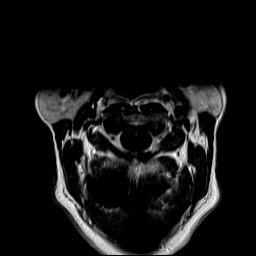

[Series 5: T1 fat-sat post-contrast · axial · 3.0mm · 0.78mm/px · z∈[-230,-133]mm · 14 of 27 slices shown (2 of 2)]
[im 1/27]
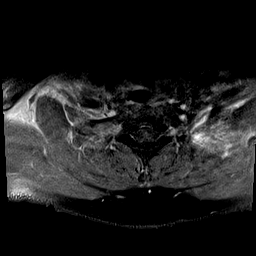
[im 3/27]
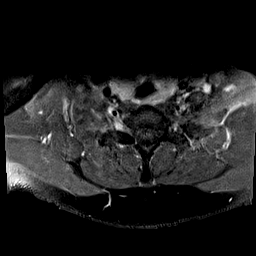
[im 5/27]
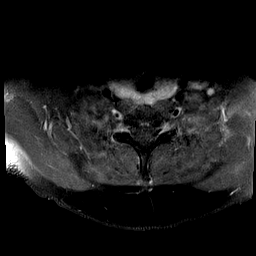
[im 7/27]
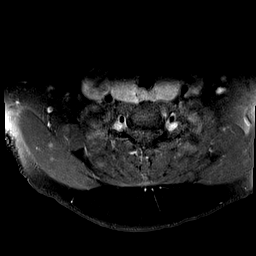
[im 9/27]
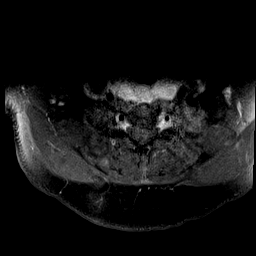
[im 11/27]
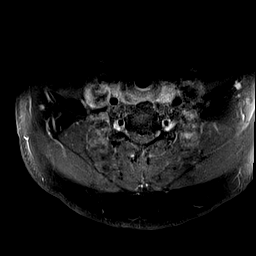
[im 13/27]
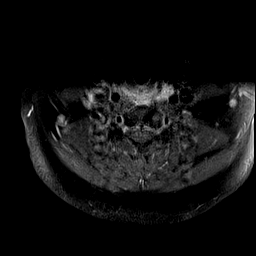
[im 15/27]
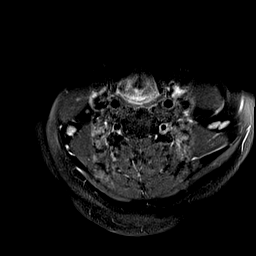
[im 17/27]
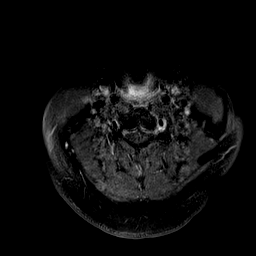
[im 19/27]
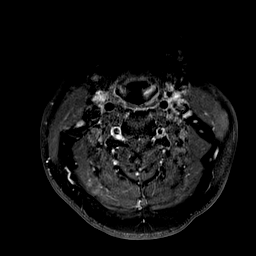
[im 21/27]
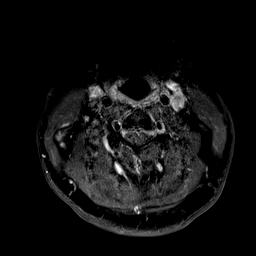
[im 23/27]
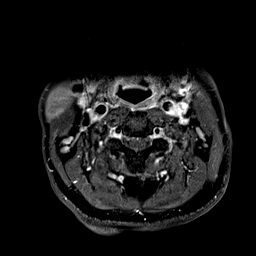
[im 25/27]
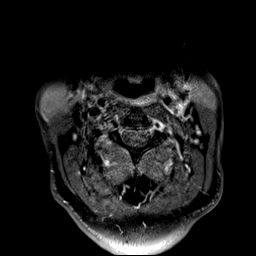
[im 27/27]
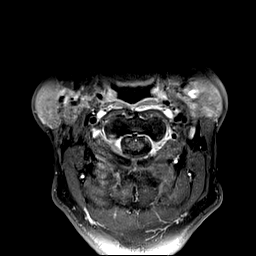

[48 of 48 positions shown; findings below may reference images not displayed]

FINDINGS: Alignment: Straightening and slight reversal of the normal cervical
lordosis is again noted. Minimal listhesis is present at C2-3 and
C3-4.

Vertebrae: Postcontrast marrow signal is normal. No pathologic
enhancement is present.

Cord: The focal lesion posteriorly at C5-6 on the left enhances.
There is no mass effect associated. No other pathologic enhancement
is present within the spinal cord or cervical nerve roots.

Posterior Fossa, vertebral arteries, paraspinal tissues:
Craniocervical junction is normal. Visualized intracranial contents
enhance normally.

Disc levels:

Ventral dural enhancement at C4-5 and C5-6 is likely reactive to the
disc disease at these levels.
IMPRESSION: 1. Focal enhancement posteriorly at C5-6 on the left without mass
effect. This is most consistent with a demyelinating plaque. A
low-grade tumor is considered unlikely, but not excluded. Recommend
follow-up MRI of the cervical spine without and with contrast in 3-6
months. Matter disease in the head supports a demyelinating lesion
rather than neoplasm.
2. No other areas of pathologic enhancement.
3. No significant disc disease.
4. Mild ventral dural enhancement at C4-5 and C5-6 is likely
reactive to the disc disease at these levels.

## 2019-12-03 MED ORDER — GADOBUTROL 1 MMOL/ML IV SOLN
9.5000 mL | Freq: Once | INTRAVENOUS | Status: AC | PRN
Start: 2019-12-03 — End: 2019-12-03
  Administered 2019-12-03: 10 mL via INTRAVENOUS

## 2019-12-04 NOTE — Progress Notes (Signed)
Nina Trujillo,   No gallbladder issues. Kidney looks good. Normal pancreas. Spleen small in size but not concerning.

## 2019-12-05 NOTE — Addendum Note (Signed)
Addended by: Silverio Decamp on: 12/05/2019 09:46 AM   Modules accepted: Orders

## 2019-12-07 ENCOUNTER — Ambulatory Visit (INDEPENDENT_AMBULATORY_CARE_PROVIDER_SITE_OTHER): Payer: BC Managed Care – PPO | Admitting: Sports Medicine

## 2019-12-07 ENCOUNTER — Other Ambulatory Visit: Payer: Self-pay

## 2019-12-07 DIAGNOSIS — G379 Demyelinating disease of central nervous system, unspecified: Secondary | ICD-10-CM

## 2019-12-07 DIAGNOSIS — M5030 Other cervical disc degeneration, unspecified cervical region: Secondary | ICD-10-CM | POA: Diagnosis not present

## 2019-12-07 NOTE — Assessment & Plan Note (Signed)
Nina Trujillo returns, she is a pleasant 54 year old female, she has a strong family history of multiple sclerosis in her father and sister, we incidentally noted some subcortical white matter changes in her parietal and occipital lobe on her cervical spine MRI. There were also some pontine T2 hyperintensities.  We completed the work-up with a cervical spine MRI with contrast as well as a brain MRI with and without contrast, both of which show potential demyelinating processes, in the cervical spine as well as throughout her brain. She does not yet have her appointment to neurology but we have done the referral already. She does have some central cervical stenosis however the area of myelomalacia is seen below which raises the concern for more of a demyelinating process rather than mechanical spinal cord compression and myelomalacia. She also does not have any upper motor neuron signs/Hoffmann sign in her upper extremities or progressive weakness as would be expected in cervical myelopathy.

## 2019-12-07 NOTE — Progress Notes (Signed)
    Procedures performed today:    None.  Independent interpretation of notes and tests performed by another provider:   I personally reviewed the cervical spine and brain MRI with the patient image by image, we did see the several parietal and occipital lobe as well as pontine T2 hyperintensities in her white matter, as well as T2 hyperintensity in her spinal cord distal to the site of spondylitic related central canal stenosis.  Brief History, Exam, Impression, and Recommendations:    Demyelinating disease of central nervous system ([place]) Nina Trujillo returns, she is a pleasant 54 year old female, she has a strong family history of multiple sclerosis in her father and sister, we incidentally noted some subcortical white matter changes in her parietal and occipital lobe on her cervical spine MRI. There were also some pontine T2 hyperintensities.  We completed the work-up with a cervical spine MRI with contrast as well as a brain MRI with and without contrast, both of which show potential demyelinating processes, in the cervical spine as well as throughout her brain. She does not yet have her appointment to neurology but we have done the referral already. She does have some central cervical stenosis however the area of myelomalacia is seen below which raises the concern for more of a demyelinating process rather than mechanical spinal cord compression and myelomalacia. She also does not have any upper motor neuron signs/Hoffmann sign in her upper extremities or progressive weakness as would be expected in cervical myelopathy.  DDD (degenerative disc disease), cervical Nina Trujillo returns, she has done physical therapy, steroids, was still having pain so we obtained a cervical spine MRI, it showed that she does have some central cervical stenosis however the area of spinal cord edema is seen below the area of central canal stenosis which raises the concern for more of a demyelinating process rather than  mechanical spinal cord compression and myelomalacia. She also does not have any upper motor neuron signs/Hoffmann sign in her upper extremities or progressive weakness as would be expected in cervical myelopathy. I would still like her to get an opinion from Dr. Lynann Bologna to have her teed up for a cervical spinal procedure should neurology deem this to be more of nonspecific/migrainous/chronic microvascular T2 intensities in her brain.    ___________________________________________ Gwen Her. Dianah Field, M.D., ABFM., CAQSM. Primary Care and [place] Instructor of [employer] [facility] [employer]

## 2019-12-07 NOTE — Assessment & Plan Note (Signed)
Nina Trujillo returns, she has done physical therapy, steroids, was still having pain so we obtained a cervical spine MRI, it showed that she does have some central cervical stenosis however the area of spinal cord edema is seen below the area of central canal stenosis which raises the concern for more of a demyelinating process rather than mechanical spinal cord compression and myelomalacia. She also does not have any upper motor neuron signs/Hoffmann sign in her upper extremities or progressive weakness as would be expected in cervical myelopathy. I would still like her to get an opinion from Dr. Lynann Bologna to have her teed up for a cervical spinal procedure should neurology deem this to be more of nonspecific/migrainous/chronic microvascular T2 intensities in her brain.

## 2019-12-13 ENCOUNTER — Ambulatory Visit: Payer: BC Managed Care – PPO | Admitting: Neurology

## 2019-12-13 ENCOUNTER — Other Ambulatory Visit: Payer: Self-pay

## 2019-12-13 ENCOUNTER — Encounter: Payer: Self-pay | Admitting: Neurology

## 2019-12-13 VITALS — BP 117/70 | HR 95 | Ht 66.0 in | Wt 215.0 lb

## 2019-12-13 DIAGNOSIS — M542 Cervicalgia: Secondary | ICD-10-CM | POA: Diagnosis not present

## 2019-12-13 DIAGNOSIS — R200 Anesthesia of skin: Secondary | ICD-10-CM

## 2019-12-13 DIAGNOSIS — R9082 White matter disease, unspecified: Secondary | ICD-10-CM | POA: Diagnosis not present

## 2019-12-13 DIAGNOSIS — M5030 Other cervical disc degeneration, unspecified cervical region: Secondary | ICD-10-CM | POA: Diagnosis not present

## 2019-12-13 MED ORDER — GABAPENTIN 300 MG PO CAPS: ORAL_CAPSULE | ORAL | 5 refills | Status: DC

## 2019-12-13 NOTE — Progress Notes (Signed)
[facility]  PATIENT: Nina Trujillo DOB: 07/26/1965  REFERRING DOCTOR OR PCP: Aundria Mems SOURCE: Patient, notes from primary care, imaging and laboratory reports, MRI images personally reviewed.  _________________________________   HISTORICAL  CHIEF COMPLAINT:  Chief Complaint  Patient presents with  . New Patient (Initial Visit)    RM 13, alone.  Internal referral from Aundria Mems, MD for demyelinating disease. Has family hx of MS: Father, sister, half-sister, Paternal Uncle    HISTORY OF PRESENT ILLNESS:  I had the pleasure seeing your patient, Nina Trujillo, at the [place] [facility] for neurologic consultation regarding her abnormal brain and cervical spine MRI and concern about the possibility of multiple sclerosis.  She is a 54 year old right-handed woman who has had numbness in her left arm into her hand and fingers.  She has had some pain of the left arm off and on for a while but she started to experience numbness about 1 month ago that gradually worsened.   She also has achiness in her neck and upper back.  She has had neck pain off and on for several years but it is worse this year.   Currently, the pain in her back is her main complaint.   Pain increases if she sits up straight or stretches backwards.  She has numbness going into the left arm to the 3rd and 4th fingers.  She has noted mild weakness in he right hand that started 4-8 weeks ago.  She dropped her coffee mug recently.  Neck pain is worse if she  looks up and better looking down.  She does not have a Lhermitte sign.  She has urinary urgency but no incontinence that has been an issue for many years.   She has bowel urgency associated with some abdomenal pain but no incontinence.   She notes blurry vision that comes and goes, especially with computer work and sometimes with driving.   She feels this has been stable over the past few years.      Vascular risk factors:   Type 2 IDDM, HTN, 1/4 ppd smoker now (1/2 to 1 ppd x many years though).      We discussed smoking cessation.     Gabapentin 300 mg po qHS and meloxicam have helped the pain some.  Gabapentin helps her sleep.  Father, sister, half-sister and paternal uncle and a cousin have MS.     The MRI of the brain from 12/03/2019 was personally reviewed and it shows multiple T2/FLAIR hyperintense foci located in the hemispheres and pons.  The foci in the pons are confluent in the medial pons.  The foci in the hemispheres are predominantly in the subcortical and deep white matter.  She does not have septal callosal foci.  None of them appear to be acute and they do not enhance.  The MRI of the cervical spine dated 11/25/2019 was personally reviewed.  It shows mild spinal stenosis at C3-C4 and moderate spinal stenosis is seen for C5 and C5-C6 due to disc protrusion and other degenerative change.  There is a T2 hyperintense focus in the left posterior lateral spinal cord just below the C5-C6 interspace.  She return for a contrasted MRI of the cervical spine 719 2021.  It showed that the abnormal focus in the spinal cord enhanced after contrast.   REVIEW OF SYSTEMS: Constitutional: No fevers, chills, sweats, or change in appetite.  She notes fatigue. Eyes: No visual changes, double vision, eye  pain Ear, nose and throat: No hearing loss, ear pain, nasal congestion, sore throat Cardiovascular: No chest pain, palpitations Respiratory: No shortness of breath at rest or with exertion.   No wheezes GastrointestinaI: No nausea, vomiting, diarrhea, abdominal pain, fecal incontinence Genitourinary:Mild urinary urgency without incontinence.   Musculoskeletal:As above  integumentary: No rash, pruritus, skin lesions Neurological: as above Psychiatric: No depression at this time.  No anxiety Endocrine: No palpitations, diaphoresis, change in appetite, change in weigh or increased  thirst Hematologic/Lymphatic: No anemia, purpura, petechiae. Allergic/Immunologic: No itchy/runny eyes, nasal congestion, recent allergic reactions, rashes  ALLERGIES: Allergies  Allergen Reactions  . Penicillins Anaphylaxis  . Xigduo Xr [Dapagliflozin-Metformin Hcl Er]     Yeast infection.     HOME MEDICATIONS:  Current Outpatient Medications:  .  AMBULATORY NON FORMULARY MEDICATION, Glucometer device and lancets and test strips.  Dx. Diabetes 250.0 controlled   Test 1 to 3 times a day., Disp: 1 Device, Rfl: 0 .  atorvastatin (LIPITOR) 20 MG tablet, Take 1 tablet (20 mg total) by mouth daily., Disp: 90 tablet, Rfl: 3 .  buPROPion (WELLBUTRIN SR) 150 MG 12 hr tablet, Take 1 tablet (150 mg total) by mouth 2 (two) times daily., Disp: 180 tablet, Rfl: 1 .  clotrimazole (LOTRIMIN) 1 % cream, Apply 1 application topically 2 (two) times daily., Disp: 60 g, Rfl: 0 .  Continuous Blood Gluc Receiver (DEXCOM G6 RECEIVER) DEVI, 1 Device by Does not apply route 4 (four) times daily as needed., Disp: 1 each, Rfl: 0 .  Continuous Blood Gluc Sensor (DEXCOM G6 SENSOR) MISC, Use to check blood sugars, Disp: 1 each, Rfl: 11 .  cyclobenzaprine (FLEXERIL) 10 MG tablet, TAKE 1 TABLET BY MOUTH AT BEDTIME., Disp: 30 tablet, Rfl: 0 .  diclofenac sodium (VOLTAREN) 1 % GEL, Apply 4 g topically 4 (four) times daily. To affected joint., Disp: 100 g, Rfl: 1 .  escitalopram (LEXAPRO) 5 MG tablet, TAKE 1 TABLET BY MOUTH EVERY DAY, Disp: 90 tablet, Rfl: 0 .  estradiol (ESTRACE) 2 MG tablet, Take 1 tablet (2 mg total) by mouth daily., Disp: 90 tablet, Rfl: 3 .  fluticasone (FLONASE) 50 MCG/ACT nasal spray, Place 2 sprays into both nostrils daily., Disp: 16 g, Rfl: 6 .  gabapentin (NEURONTIN) 300 MG capsule, One tab PO qHS for a week, then BID for a week, then TID. May double weekly to a max of 3,600mg /day, Disp: 90 capsule, Rfl: 3 .  hydrochlorothiazide (HYDRODIURIL) 25 MG tablet, TAKE 1 TABLET BY MOUTH DAILY., Disp: 90  tablet, Rfl: 3 .  Insulin Pen Needle (PEN NEEDLES) 31G X 6 MM MISC, Injected insulin into skin once daily., Disp: 100 each, Rfl: prn .  meloxicam (MOBIC) 15 MG tablet, One tab PO qAM with a meal for 2 weeks, then daily prn pain., Disp: 30 tablet, Rfl: 3 .  metFORMIN (GLUCOPHAGE) 1000 MG tablet, Take 1 tablet (1,000 mg total) by mouth 2 (two) times daily with a meal., Disp: 180 tablet, Rfl: 3 .  phentermine 15 MG capsule, Take 1 capsule (15 mg total) by mouth every morning., Disp: 30 capsule, Rfl: 0 .  Semaglutide, 1 MG/DOSE, (OZEMPIC, 1 MG/DOSE,) 2 MG/1.5ML SOPN, Inject 0.75 mLs (1 mg total) into the skin once a week., Disp: 2 pen, Rfl: 2 .  topiramate (TOPAMAX) 25 MG tablet, TAKE 1 TABLET BY MOUTH TWO TIMES DAILY., Disp: 60 tablet, Rfl: 2 .  TRESIBA FLEXTOUCH 100 UNIT/ML FlexTouch Pen, INJECT 13 UNITS SUBCUTANEOUSLY AT BEDTIME, Disp: 3 mL, Rfl:  2 .  triamcinolone cream (KENALOG) 0.1 %, Apply 1 application topically 2 (two) times daily., Disp: 80 g, Rfl: 0  PAST MEDICAL HISTORY: Past Medical History:  Diagnosis Date  . Chronic kidney disease    stage 3   . Depression   . Diabetes mellitus without complication ([place])   . Hypertension     PAST SURGICAL HISTORY: Past Surgical History:  Procedure Laterality Date  . ABDOMINAL HYSTERECTOMY     both uterus and ovaries removed.   . COLONOSCOPY  10 + years ago    IN [facility]-normal exam per pt    FAMILY HISTORY: Family History  Problem Relation Age of Onset  . Cancer Mother        cervical, breast  . Multiple sclerosis Mother   . Multiple sclerosis Father   . Heart attack Brother   . Cancer Maternal Aunt        lung  . Diabetes Maternal Aunt   . Hypertension Maternal Aunt   . Stroke Maternal Grandmother   . Multiple sclerosis Sister   . Diabetes Sister   . Diabetes Maternal Aunt   . Hypertension Maternal Aunt   . Multiple sclerosis Paternal Uncle   . Multiple sclerosis Half-Sister   . Colon cancer Neg Hx   . Esophageal cancer  Neg Hx   . Rectal cancer Neg Hx   . Stomach cancer Neg Hx   . Colon polyps Neg Hx     SOCIAL HISTORY:  Social History   Socioeconomic History  . Marital status: Single    Spouse name: Not on file  . Number of children: 2  . Years of education: Not on file  . Highest education level: Not on file  Occupational History  . Not on file  Tobacco Use  . Smoking status: Current Every Day Smoker    Packs/day: 0.50    Types: Cigarettes  . Smokeless tobacco: Never Used  Vaping Use  . Vaping Use: Never used  Substance and Sexual Activity  . Alcohol use: No    Alcohol/week: 0.0 standard drinks  . Drug use: No  . Sexual activity: Yes  Other Topics Concern  . Not on file  Social History Narrative   Lives with daughter    Caffeine use: coffee daily   Right handed    Social Determinants of Health   Financial Resource Strain:   . Difficulty of Paying Living Expenses:   Food Insecurity:   . Worried About Charity fundraiser in the Last Year:   . Arboriculturist in the Last Year:   Transportation Needs:   . Film/video editor (Medical):   [name] Lack of Transportation (Non-Medical):   Physical Activity:   . Days of Exercise per Week:   . Minutes of Exercise per Session:   Stress:   . Feeling of Stress :   Social Connections:   . Frequency of Communication with Friends and Family:   . Frequency of Social Gatherings with Friends and Family:   . Attends Religious Services:   . Active Member of Clubs or Organizations:   . Attends Archivist Meetings:   [name] Marital Status:   Intimate Partner Violence:   . Fear of Current or Ex-Partner:   . Emotionally Abused:   [name] Physically Abused:   . Sexually Abused:      PHYSICAL EXAM  Vitals:   12/13/19 1301  BP: 117/70  Pulse: 95  Weight: (!) 215 lb (97.5 kg)  Height:  5\' 6"  (1.676 m)    Body mass index is 34.7 kg/m.   General: The patient is well-developed and well-nourished and in no acute distress  HEENT:  Head  is /AT.  Sclera are anicteric.  Funduscopic exam shows normal optic discs and retinal vessels.  Neck: No carotid bruits are noted.  The neck is nontender.  Cardiovascular: The heart has a regular rate and rhythm with a normal S1 and S2. There were no murmurs, gallops or rubs.    Skin: Extremities are without rash or  edema.  Musculoskeletal:  Back is nontender  Neurologic Exam  Mental status: The patient is alert and oriented x 3 at the time of the examination. The patient has apparent normal recent and remote memory, with an apparently normal attention span and concentration ability.   Speech is normal.  Cranial nerves: Extraocular movements are full. Pupils are equal, round, and reactive to light and accomodation.  Color vision was normal.  Facial symmetry is present. There is good facial sensation to soft touch bilaterally.Facial strength is normal.  Trapezius and sternocleidomastoid strength is normal. No dysarthria is noted.  The tongue is midline, and the patient has symmetric elevation of the soft palate. No obvious hearing deficits are noted.  Motor:  Muscle bulk is normal.   Tone is normal. Strength is  5 / 5 in all 4 extremities.   Sensory: She reported altered sensation to touch and temperature in the mid left palm.  Elsewhere in the arms sensation was symmetric.  Sensation was normal in the legs.  Coordination: Cerebellar testing reveals good finger-nose-finger and heel-to-shin bilaterally.  Gait and station: Station is normal.   Gait is arthritic and tandem gait is wide.. Romberg is negative.   Reflexes: Deep tendon reflexes are symmetric and normal bilaterally.   Plantar responses are flexor.    DIAGNOSTIC DATA (LABS, IMAGING, TESTING) - I reviewed patient records, labs, notes, testing and imaging myself where available.  Lab Results  Component Value Date   WBC 13.9 06/13/2013   HGB 14.3 06/13/2013   HCT 42 06/13/2013   PLT 363 06/13/2013      Component Value  Date/Time   NA 137 05/19/2018 1539   NA 140 06/13/2013 0000   K 4.2 05/19/2018 1539   CL 102 05/19/2018 1539   CO2 25 05/19/2018 1539   GLUCOSE 347 (H) 05/19/2018 1539   BUN 17 05/19/2018 1539   CREATININE 1.25 (H) 05/19/2018 1539   CALCIUM 10.0 05/19/2018 1539   CALCIUM 10.1 06/13/2013 0000   PROT 7.2 05/19/2018 1539   ALBUMIN 4.1 12/13/2014 1153   AST 18 05/19/2018 1539   ALT 18 05/19/2018 1539   ALKPHOS 68 12/13/2014 1153   BILITOT 0.3 05/19/2018 1539   GFRNONAA 49 (L) 05/19/2018 1539   GFRAA 56 (L) 05/19/2018 1539   Lab Results  Component Value Date   CHOL 192 05/19/2018   HDL 44 (L) 05/19/2018   LDLCALC 115 (H) 05/19/2018   TRIG 213 (H) 05/19/2018   CHOLHDL 4.4 05/19/2018   Lab Results  Component Value Date   HGBA1C 6.8 (A) 11/16/2019       ASSESSMENT AND PLAN  White matter abnormality on MRI of brain - Plan: DG FL GUIDED LUMBAR PUNCTURE  DDD (degenerative disc disease), cervical - Plan: gabapentin (NEURONTIN) 300 MG capsule, NCV with EMG(electromyography)  Numbness - Plan: NCV with EMG(electromyography)  Neck pain   In summary, Ms. Sandquist is a 54 year old woman with left hand numbness who was found  to have an enhancing spinal cord focus posterior laterally to the left at C5-C6.  Additionally, she has multiple T2 hyperintense foci in the brain.  I discussed with her that the pattern of foci in the brain is much more consistent with chronic microvascular ischemic change.  She does have vascular risks including diabetes mellitus, hypertension and history of smoking.  I have advised her to quit smoking.  The focus in the spinal cord is concerning for a demyelinating transverse myelitis.  It is not a typical appearance for myelopathy though it is just beneath a level with moderate spinal stenosis.  I discussed with her that there is a chance she could have multiple sclerosis.  This is based more on the finding in the spinal cord than the multiple foci in the brain  that do not have a typical appearance for MS.  Therefore, we need to get additional information and I have arranged for her to get a lumbar puncture.  If she does have oligoclonal bands, the probability of MS is much higher and I would recommend that she begin a disease modifying therapy.  If the LP is normal, then I would recommend that we reimage in about 6 months to determine if there has been additional changes.  We will additionally check an NCV/EMG to determine if there is any alternative explanation for the numbness and to better assess if she has active radiculopathy.  Her neck pain is probably more related to the degenerative changes and spinal stenosis.  I did recommend that she increase her gabapentin dose.  She will return to see me for the NCV/EMG study and further follow-up can be arranged at that time.   Frederich Montilla A. Felecia Shelling, MD, [facility] 10/28/4313, 4:00 PM Certified in Neurology, Clinical Neurophysiology, Sleep Medicine and Neuroimaging  [facility] [address] [place], [place] [address] [phone]

## 2019-12-20 ENCOUNTER — Ambulatory Visit
Admission: RE | Admit: 2019-12-20 | Discharge: 2019-12-20 | Disposition: A | Discharge status: Home / Self Care | Payer: BC Managed Care – PPO | Source: Ambulatory Visit | Attending: Neurology | Admitting: Neurology

## 2019-12-20 VITALS — BP 104/48 | HR 76

## 2019-12-20 DIAGNOSIS — R200 Anesthesia of skin: Secondary | ICD-10-CM | POA: Diagnosis not present

## 2019-12-20 DIAGNOSIS — R9082 White matter disease, unspecified: Secondary | ICD-10-CM

## 2019-12-20 IMAGING — XA DG SPINAL PUNCT LUMBAR DIAG WITH FL CT GUIDANCE
1 series · 1 of 1 positions shown · non-contrast
Comparison: none

CLINICAL DATA: Numbness. Abnormal brain and cervical spine MRIs.
Possible multiple sclerosis.

[Series 1: ortho standard · 1 of 1 slices shown]
[im 1/1]
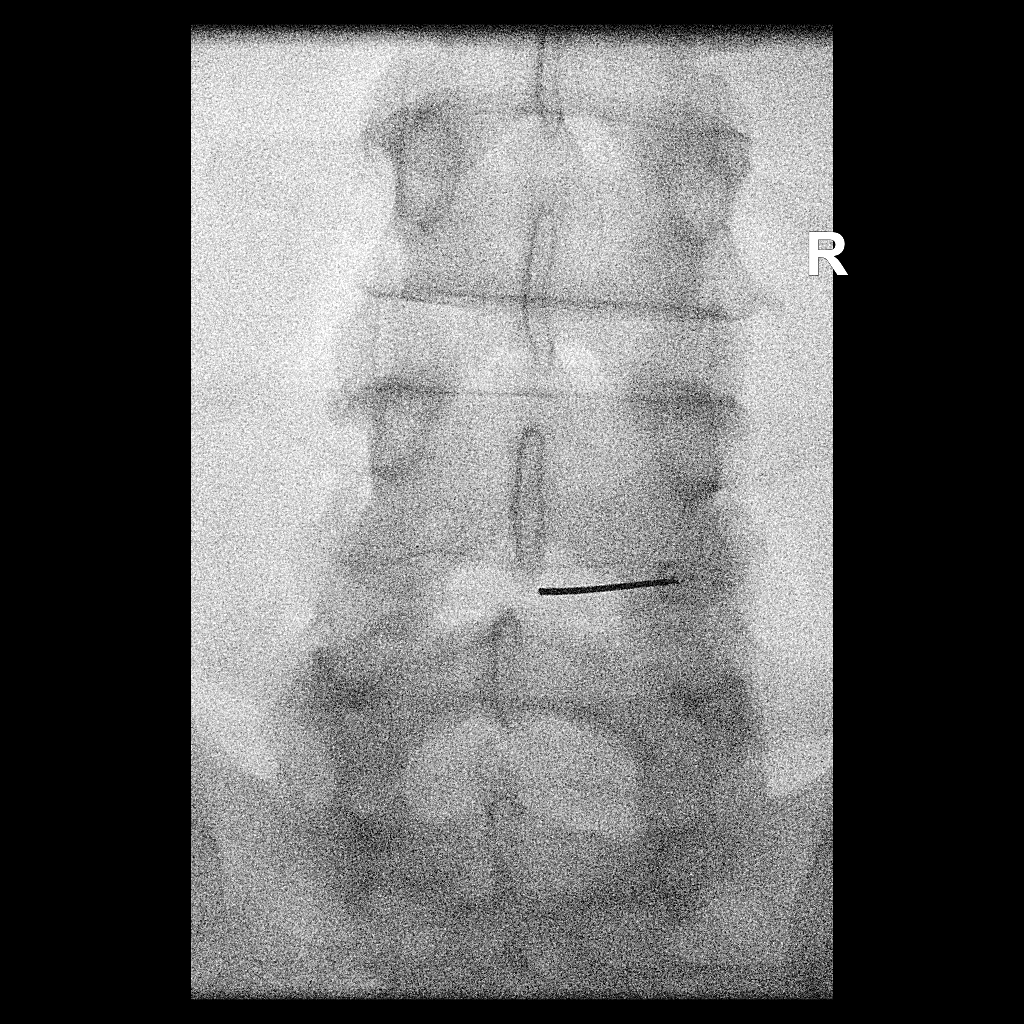

[1 of 1 positions shown; findings below may reference images not displayed]

EXAM:
DIAGNOSTIC LUMBAR PUNCTURE UNDER FLUOROSCOPIC GUIDANCE

FLUOROSCOPY TIME:  Fluoroscopy Time:  6 seconds

Radiation Exposure Index (if provided by the fluoroscopic device):
7.52 microGray*m^2

Number of Acquired Spot Images: 0

PROCEDURE:
Informed consent was obtained from the patient prior to the
procedure, including potential complications of headache, allergy,
and pain. With the patient prone, the lower back was prepped with
Betadine. 1% Lidocaine was used for local anesthesia. Lumbar
puncture was performed at the L4-5 level using a 20 gauge needle via
a right interlaminar approach with return of clear CSF with an
opening pressure of 17 cm water (measured in the left lateral
decubitus position). A 6 inch needle was used, however a 3.5 inch
needle would have been sufficient. 10 mL of CSF were obtained for
laboratory studies. The patient tolerated the procedure well and
there were no apparent complications.
IMPRESSION: Successful fluoroscopically guided lumbar puncture.

## 2019-12-20 NOTE — Discharge Instructions (Signed)

## 2019-12-20 NOTE — Progress Notes (Signed)
Blood obtained from pt's R ac for LP labs by Joellen Jersey, Therapist, sports. Site is unremarkable. Pt tolerated procedure well.

## 2019-12-21 ENCOUNTER — Encounter: Payer: Self-pay | Admitting: Physician Assistant

## 2019-12-21 MED ORDER — FLUCONAZOLE 150 MG PO TABS: 150.0000 mg | ORAL_TABLET | Freq: Once | ORAL | 0 refills | Status: AC

## 2019-12-21 NOTE — Telephone Encounter (Signed)
No openings in office today

## 2019-12-25 ENCOUNTER — Other Ambulatory Visit: Payer: Self-pay

## 2019-12-25 ENCOUNTER — Encounter: Payer: Self-pay | Admitting: Physician Assistant

## 2019-12-25 DIAGNOSIS — E119 Type 2 diabetes mellitus without complications: Secondary | ICD-10-CM

## 2019-12-25 MED ORDER — TRESIBA FLEXTOUCH 100 UNIT/ML ~~LOC~~ SOPN: PEN_INJECTOR | SUBCUTANEOUS | 3 refills | Status: DC

## 2019-12-26 LAB — OLIGOCLONAL BANDS, CSF + SERM

## 2019-12-26 LAB — CNS IGG SYNTHESIS RATE, CSF+BLOOD
Albumin Serum: 4.3 g/dL (ref 3.5–5.2)
Albumin, CSF: 25.1 mg/dL (ref 8.0–42.0)
CNS-IgG Synthesis Rate: -4.1 mg/24 h (ref <=3.3)
IgG (Immunoglobin G), Serum: 1350 mg/dL (ref 600–1640)
IgG Total CSF: 3.7 mg/dL (ref 0.8–7.7)
IgG-Index: 0.47 (ref <0.66)

## 2019-12-26 LAB — CSF CELL COUNT WITH DIFFERENTIAL
RBC Count, CSF: 1 cells/uL — ABNORMAL HIGH
WBC, CSF: 1 cells/uL (ref 0–5)

## 2019-12-26 LAB — GLUCOSE, CSF: Glucose, CSF: 77 mg/dL (ref 40–80)

## 2019-12-26 LAB — VDRL, CSF: VDRL Quant, CSF: NONREACTIVE

## 2019-12-26 LAB — PROTEIN, CSF: Total Protein, CSF: 51 mg/dL — ABNORMAL HIGH (ref 15–45)

## 2019-12-27 ENCOUNTER — Telehealth: Payer: Self-pay | Admitting: Neurology

## 2019-12-27 DIAGNOSIS — G373 Acute transverse myelitis in demyelinating disease of central nervous system: Secondary | ICD-10-CM

## 2019-12-27 DIAGNOSIS — R9082 White matter disease, unspecified: Secondary | ICD-10-CM

## 2019-12-27 NOTE — Telephone Encounter (Signed)
I spoke to Ms. Atchley about the results of the lumbar puncture.  Although there were 5 oligoclonal bands in the CSF, they were all matched to you bands in the serum.  This pattern would be more consistent with a systemic reaction rather than to intrathecal synthesis as would be seen with MS.  Therefore, the likelihood that she has MS is small.  I do not have a good explanation for the small focus in her spinal cord and I will check some blood work for vasculitis labs.  He may need to follow-up with repeat imaging later this year or early next year.  "5 identical gamma restriction bands are observed in  CSF and serum"  Also, IgG index was normal.

## 2019-12-28 DIAGNOSIS — M542 Cervicalgia: Secondary | ICD-10-CM | POA: Diagnosis not present

## 2020-01-01 ENCOUNTER — Other Ambulatory Visit: Payer: Self-pay

## 2020-01-01 ENCOUNTER — Other Ambulatory Visit: Payer: Self-pay | Admitting: Orthopedic Surgery

## 2020-01-01 ENCOUNTER — Ambulatory Visit (INDEPENDENT_AMBULATORY_CARE_PROVIDER_SITE_OTHER): Payer: BC Managed Care – PPO | Admitting: Sports Medicine

## 2020-01-01 DIAGNOSIS — G379 Demyelinating disease of central nervous system, unspecified: Secondary | ICD-10-CM | POA: Diagnosis not present

## 2020-01-01 DIAGNOSIS — M5030 Other cervical disc degeneration, unspecified cervical region: Secondary | ICD-10-CM

## 2020-01-01 NOTE — Assessment & Plan Note (Signed)
Nina Trujillo returns, she has failed conservative treatment, she has cervical central stenosis with a small area of spinal cord edema however this area was below the area of central canal stenosis. There was an initial concern for demyelinating process such as transverse myelitis or multiple sclerosis, it sounds as though she had a lumbar puncture and her oligoclonal band testing was unremarkable. She does have a cervical fusion scheduled with Dr. Lynann Bologna now. Further management per him.

## 2020-01-01 NOTE — Progress Notes (Signed)
    Procedures performed today:    None.  Independent interpretation of notes and tests performed by another provider:   None.  Brief History, Exam, Impression, and Recommendations:    DDD (degenerative disc disease), cervical Nina Trujillo returns, she has failed conservative treatment, she has cervical central stenosis with a small area of spinal cord edema however this area was below the area of central canal stenosis. There was an initial concern for demyelinating process such as transverse myelitis or multiple sclerosis, it sounds as though she had a lumbar puncture and her oligoclonal band testing was unremarkable. She does have a cervical fusion scheduled with Dr. Lynann Bologna now. Further management per him.  Demyelinating disease of central nervous system [facility]) Nina Trujillo also has nonspecific T2 hyperintensities consistent with possible demyelinating process, she has been seen by neurology, oligoclonal band testing was negative, she is going to have a nerve conduction/EMG as well as vasculitis testing with ANCAs. Further management here per neurology. Return to see me in an as-needed basis.    ___________________________________________ Gwen Her. Dianah Field, M.D., ABFM., CAQSM. Primary Care and [place] Instructor of [employer] [facility] [employer]

## 2020-01-01 NOTE — Assessment & Plan Note (Signed)
Nina Trujillo also has nonspecific T2 hyperintensities consistent with possible demyelinating process, she has been seen by neurology, oligoclonal band testing was negative, she is going to have a nerve conduction/EMG as well as vasculitis testing with ANCAs. Further management here per neurology. Return to see me in an as-needed basis.

## 2020-01-09 NOTE — Pre-Procedure Instructions (Addendum)
Your procedure is scheduled on Thursday, September 2 from 07:30 AM- 10:11 AM.  Report to [facility] Main Entrance "A" at 05:30 A.M., and check in at the Admitting office.  Call this number if you have problems the morning of surgery:  [phone]  Call [phone] if you have any questions prior to your surgery date Monday-Friday 8am-4pm.    Remember:  Do not eat after midnight the night before your surgery.  You may drink clear liquids until 04:30 AM the morning of your surgery.   Clear liquids allowed are: Water, Non-Citrus Juices (without pulp), Carbonated Beverages, Clear Tea, Black Coffee Only, and Gatorade.  . The day of surgery (if you have diabetes): o  o Drink ONE (1) Gatorade 2 (G2) by 04:30 AM the morning of surgery. o This drink was given to you during your hospital . pre-op appointment visit.  o Color of the Gatorade may vary. Red is not allowed. o Nothing else to drink after completing the  Gatorade 2 (G2).         If you have questions, please contact your surgeon's office.     Take these medicines the morning of surgery with A SIP OF WATER: atorvastatin (LIPITOR)  buPROPion (WELLBUTRIN SR) gabapentin (NEURONTIN) topiramate (TOPAMAX)  IF NEEDED: cyclobenzaprine (FLEXERIL)  As of today, STOP taking any Aspirin (unless otherwise instructed by your surgeon), diclofenac sodium (VOLTAREN) GEL, meloxicam (MOBIC), Aleve, Naproxen, Ibuprofen, Motrin, Advil, Goody's, BC's, all herbal medications, fish oil, and all vitamins.   WHAT DO I DO ABOUT MY DIABETES MEDICATION?  . THE NIGHT BEFORE SURGERY, take 6.5 units of insulin degludec (TRESIBA FLEXTOUCH).   . Do not take metFORMIN (GLUCOPHAGE) the morning of surgery.     . The day of surgery, do not take other diabetes injectables, including insulin degludec (TRESIBA FLEXTOUCH).   HOW TO MANAGE YOUR DIABETES BEFORE AND AFTER SURGERY  Why is it important to control my blood sugar before and after  surgery? . Improving blood sugar levels before and after surgery helps healing and can limit problems. . A way of improving blood sugar control is eating a healthy diet by: o  Eating less sugar and carbohydrates o  Increasing activity/exercise o  Talking with your doctor about reaching your blood sugar goals . High blood sugars (greater than 180 mg/dL) can raise your risk of infections and slow your recovery, so you will need to focus on controlling your diabetes during the weeks before surgery. . Make sure that the doctor who takes care of your diabetes knows about your planned surgery including the date and location.  How do I manage my blood sugar before surgery? . Check your blood sugar at least 4 times a day, starting 2 days before surgery, to make sure that the level is not too high or low. . Check your blood sugar the morning of your surgery when you wake up and every 2 hours until you get to the Short Stay unit. o If your blood sugar is less than 70 mg/dL, you will need to treat for low blood sugar: - Do not take insulin. - Treat a low blood sugar (less than 70 mg/dL) with  cup of clear juice (cranberry or apple), 4 glucose tablets, OR glucose gel. - Recheck blood sugar in 15 minutes after treatment (to make sure it is greater than 70 mg/dL). If your blood sugar is not greater than 70 mg/dL on recheck, call [phone] for further instructions. . Report your blood sugar to  the short stay nurse when you get to Short Stay.  . If you are admitted to the hospital after surgery: o Your blood sugar will be checked by the staff and you will probably be given insulin after surgery (instead of oral diabetes medicines) to make sure you have good blood sugar levels. o The goal for blood sugar control after surgery is 80-180 mg/dL.           The Morning of Surgery:            Do not wear jewelry, make up, or nail polish            Do not wear lotions, powders, perfumes, or deodorant.             Do not shave 48 hours prior to surgery.             Do not bring valuables to the hospital.            [facility] is not responsible for any belongings or valuables.  Do NOT Smoke (Tobacco/Vaping) or drink Alcohol 24 hours prior to your procedure. If you use a CPAP at night, you may bring all equipment for your overnight stay.   Contacts, glasses, dentures or bridgework may not be worn into surgery.      For patients admitted to the hospital, discharge time will be determined by your treatment team.   Patients discharged the day of surgery will not be allowed to drive home, and someone needs to stay with them for 24 hours.    Special instructions:   [place]- Preparing For Surgery  Before surgery, you can play an important role. Because skin is not sterile, your skin needs to be as free of germs as possible. You can reduce the number of germs on your skin by washing with CHG (chlorahexidine gluconate) Soap before surgery.  CHG is an antiseptic cleaner which kills germs and bonds with the skin to continue killing germs even after washing.    Oral Hygiene is also important to reduce your risk of infection.  Remember - BRUSH YOUR TEETH THE MORNING OF SURGERY WITH YOUR REGULAR TOOTHPASTE  Please do not use if you have an allergy to CHG or antibacterial soaps. If your skin becomes reddened/irritated stop using the CHG.  Do not shave (including legs and underarms) for at least 48 hours prior to first CHG shower. It is OK to shave your face.  Please follow these instructions carefully.   1. Shower the NIGHT BEFORE SURGERY and the MORNING OF SURGERY with CHG Soap.   2. If you chose to wash your hair, wash your hair first as usual with your normal shampoo.  3. After you shampoo, rinse your hair and body thoroughly to remove the shampoo.  4. Use CHG as you would any other liquid soap. You can apply CHG directly to the skin and wash gently with a scrungie or a clean washcloth.    5. Apply the CHG Soap to your body ONLY FROM THE NECK DOWN.  Do not use on open wounds or open sores. Avoid contact with your eyes, ears, mouth and genitals (private parts). Wash Face and genitals (private parts)  with your normal soap.   6. Wash thoroughly, paying special attention to the area where your surgery will be performed.  7. Thoroughly rinse your body with warm water from the neck down.  8. DO NOT shower/wash with your normal soap after using and rinsing off the  CHG Soap.  9. Pat yourself dry with a CLEAN TOWEL.  10. Wear CLEAN PAJAMAS to bed the night before surgery  11. Place CLEAN SHEETS on your bed the night of your first shower and DO NOT SLEEP WITH PETS.   Day of Surgery: Wear Clean/Comfortable clothing the morning of surgery Do not apply any deodorants/lotions.   Remember to brush your teeth WITH YOUR REGULAR TOOTHPASTE.   Please read over the following fact sheets that you were given.

## 2020-01-10 ENCOUNTER — Inpatient Hospital Stay (HOSPITAL_COMMUNITY)
Admission: RE | Admit: 2020-01-10 | Discharge: 2020-01-10 | Disposition: A | Discharge status: Home / Self Care | Payer: BC Managed Care – PPO | Source: Ambulatory Visit

## 2020-01-14 ENCOUNTER — Other Ambulatory Visit (HOSPITAL_COMMUNITY): Payer: BC Managed Care – PPO

## 2020-01-14 ENCOUNTER — Other Ambulatory Visit: Payer: Self-pay | Admitting: Neurology

## 2020-01-14 DIAGNOSIS — Z6834 Body mass index (BMI) 34.0-34.9, adult: Secondary | ICD-10-CM

## 2020-01-14 MED ORDER — TOPIRAMATE 25 MG PO TABS: 25.0000 mg | ORAL_TABLET | Freq: Two times a day (BID) | ORAL | 0 refills | Status: DC

## 2020-01-15 ENCOUNTER — Ambulatory Visit (INDEPENDENT_AMBULATORY_CARE_PROVIDER_SITE_OTHER): Payer: BC Managed Care – PPO | Admitting: Neurology

## 2020-01-15 ENCOUNTER — Encounter: Payer: BC Managed Care – PPO | Admitting: Neurology

## 2020-01-15 DIAGNOSIS — Z0289 Encounter for other administrative examinations: Secondary | ICD-10-CM

## 2020-01-15 DIAGNOSIS — M5030 Other cervical disc degeneration, unspecified cervical region: Secondary | ICD-10-CM

## 2020-01-15 DIAGNOSIS — R9082 White matter disease, unspecified: Secondary | ICD-10-CM | POA: Insufficient documentation

## 2020-01-15 DIAGNOSIS — R200 Anesthesia of skin: Secondary | ICD-10-CM | POA: Diagnosis not present

## 2020-01-15 DIAGNOSIS — G373 Acute transverse myelitis in demyelinating disease of central nervous system: Secondary | ICD-10-CM | POA: Diagnosis not present

## 2020-01-15 DIAGNOSIS — G5603 Carpal tunnel syndrome, bilateral upper limbs: Secondary | ICD-10-CM

## 2020-01-15 DIAGNOSIS — G379 Demyelinating disease of central nervous system, unspecified: Secondary | ICD-10-CM | POA: Diagnosis not present

## 2020-01-15 NOTE — Progress Notes (Addendum)
Full Name:  Nina Trujillo Gender: Female MRN #:   [id number] Date of Birth: 1965-07-09    Visit Date: 01/15/2020 08:37 Age: 54 Years Examining Physician: Arlice Colt, MD  Referring Physician: Arlice Colt, MD Height: 5 feet 6 inch    History: Nina Trujillo is a 54 year old woman with pain and numbness in the arms who was found to have an abnormal T2 hyperintense focus within the spinal cord posteriorly to the left adjacent to C6.  Currently, the pain is most intense in the left neck.  Currently the numbness is most intense in the left hand and in the first 4 fingers.  She has milder symptoms on the right.  On exam, strength was normal but she had reduced sensation in the distribution of the left median nerve and to a lesser extent in the right median nerve.  Nerve conduction studies: Bilateral median motor responses had delayed distal latencies, right worse than left, with normal amplitudes and normal forearm conduction.  Bilateral sensory motor responses were slowed across the wrist, right worse than left, with low normal amplitudes.  Left ulnar motor and sensory responses were normal.  Electromyography:  Needle EMG of selected muscles of both arms was performed.  In the left arm, there were a few polyphasic motor units and C5/C6 innervated muscles though recruitment was fairly normal.  Other muscles tested were normal.  In the right arm, there was mild chronic denervation in 2 of the C5/C6 innervated muscles tested.  There was no abnormal spontaneous activity in any of the muscles tested.  Impression: This NCV/EMG study shows the following: 1.   Moderate bilateral median neuropathies across the wrist, right worse than left. 2.   Mild right chronic C5 or C6 radiculopathy and possible left chronic C5 or C6 radiculopathy.  Bertran Zeimet A. Felecia Shelling, MD, PhD, FAAN Certified in Neurology, Clinical Neurophysiology, Sleep Medicine, Pain Medicine and Neuroimaging Director, [place] [facility] [address] [place], [place] [address] [phone]  Clinical note: I discussed with Nina Trujillo that the symptoms in the left arm could be coming from 3 different sources, radiculopathy, carpal tunnel syndrome and the spinal cord where she has abnormal signal. --RAS  Verbal informed consent was obtained from the patient, patient was informed of potential risk of procedure, including bruising, bleeding, hematoma formation, infection, muscle weakness, muscle pain, numbness, among others.         [place]    Nerve / Sites Muscle Latency Ref. Amplitude Ref. Rel Amp Segments Distance Velocity Ref. Area    ms ms mV mV %  cm m/s m/s mVms  L Median - APB     Wrist APB 4.9 ?4.4 8.7 ?4.0 100 Wrist - APB 7   40.5     Upper arm APB 9.2  8.2  94.9 Upper arm - Wrist 21 49 ?49 36.8  R Median - APB     Wrist APB 6.3 ?4.4 8.8 ?4.0 100 Wrist - APB 7   38.3     Upper arm APB 10.6  7.9  90.7 Upper arm - Wrist 21 49 ?49 35.0  L Ulnar - ADM     Wrist ADM 3.3 ?3.3 11.9 ?6.0 100 Wrist - ADM 7   44.1     B.Elbow ADM 7.3  10.3  86.7 B.Elbow - Wrist 20 51 ?49 37.8     A.Elbow ADM 9.3  10.6  102 A.Elbow - B.Elbow  10 49 ?49 40.2         A.Elbow - Wrist               SNC    Nerve / Sites Rec. Site Peak Lat Ref.  Amp Ref. Segments Distance    ms ms V V  cm  L Median - Orthodromic (Dig II, Mid palm)     Dig II Wrist 3.9 ?3.4 13 ?10 Dig II - Wrist 13  R Median - Orthodromic (Dig II, Mid palm)     Dig II Wrist 4.9 ?3.4 11 ?10 Dig II - Wrist 13  L Ulnar - Orthodromic, (Dig V, Mid palm)     Dig V Wrist 3.1 ?3.1 8 ?5 Dig V - Wrist 80           F  Wave    Nerve F Lat Ref.   ms ms  L Ulnar - ADM 30.2 ?32.0       EMG Summary Table    Spontaneous MUAP Recruitment  Muscle IA Fib PSW Fasc Other Amp Dur. Poly Pattern  L. Deltoid Normal None None None _______ Normal Normal 1+ Normal  L. Triceps brachii Normal None None None _______  Increased Increased 1+ Normal  L. Biceps brachii Normal None None None _______ Normal Normal 1+ Normal  L. Extensor digitorum communis Normal None None None _______ Increased Increased Normal Normal  L. Flexor carpi ulnaris Normal None None None _______ Normal Normal Normal Normal  L. Abductor pollicis brevis Normal None None None _______ Normal Normal Normal Normal  L. First dorsal interosseous Normal None None None _______ Normal Normal Normal Normal  L. Supraspinatus Normal None None None _______ Normal Normal 1+ Normal  R. Deltoid Normal None None None _______ Normal Increased 1+ Reduced  R. Biceps brachii Normal None None None _______ Normal Increased 1+ Reduced  R. Triceps brachii Normal None None None _______ Normal Normal 1+ Normal  R. Extensor digitorum communis Normal None None None _______ Normal Normal Normal Normal  R. First dorsal interosseous Normal None None None _______ Normal Normal Normal Normal  R. Abductor pollicis brevis Normal None None None _______ Normal Normal Normal Normal  R. Flexor carpi ulnaris Normal None None None _______ Normal Increased Normal Normal  R. Supraspinatus Normal None None None _______ Normal Normal 1+ Normal        [facility]  PATIENT: Nina Trujillo DOB: [birth year]  REFERRING DOCTOR OR PCP: Aundria Mems SOURCE: Patient, notes from primary care, imaging and laboratory reports, MRI images personally reviewed.  _________________________________   HISTORICAL  CHIEF COMPLAINT:  No chief complaint on file.   HISTORY OF PRESENT ILLNESS:  Nina Trujillo is a 54 year old right-handed woman who has had numbness in her left arm into her hand and fingers.  She also experiences some pain in the region of numbness and also has left greater than right neck pain.  She has milder symptoms on the right.  She has had neck pain off and on for several years but it is worse this year.   Currently, the pain in her back  is her main complaint.   Pain increases if she sits up straight or stretches backwards.  She has numbness going into the left arm to the 3rd and 4th fingers.  She has noted mild weakness in he right hand that started 4-8 weeks ago.  She dropped her coffee mug recently.  Neck pain is worse if she  looks up and  better looking down.  She does not have a Lhermitte sign.  She has urinary urgency but no incontinence that has been an issue for many years.   She has bowel urgency associated with some abdomenal pain but no incontinence.   MRI of the cervical spine has shown moderate spinal stenosis at C4-C5 and C5-C6 with severe right foraminal narrowing at C4-C5 and bilateral foraminal narrowing at C5-C6, worse on the left.  Additionally there is a T2 hyperintense focus within the spinal cord posteriorly to the left adjacent to C6.  MRI of the brain had shown multiple lesions but they were more consistent with chronic microvascular ischemic change than to demyelination.    We did proceed with a lumbar puncture earlier this month and she has some oligoclonal bands that were present but they were also present in the serum imply a more systemic inflammatory response.  This would not be the pattern typical for MS.  Today, she had an NCV/EMG study showing moderate bilateral median neuropathies at the wrist, mild right C5-C6 chronic radiculopathy and possible left C5-C6 chronic radiculopathy.  Gabapentin 300 mg po qHS and meloxicam have helped the pain some.  Gabapentin helps her sleep.  Father, sister, half-sister and paternal uncle and a cousin have MS.     DATA The MRI of the brain from 12/03/2019 was personally reviewed and it shows multiple T2/FLAIR hyperintense foci located in the hemispheres and pons.  The foci in the pons are confluent in the medial pons.  The foci in the hemispheres are predominantly in the subcortical and deep white matter.  She does not have septal callosal foci.  None of them appear to  be acute and they do not enhance.  The MRI of the cervical spine dated 11/25/2019 was personally reviewed.  It shows mild spinal stenosis at C3-C4 and moderate spinal stenosis is C4-C5 and C5-C6 due to disc protrusion and other degenerative change.  There is a T2 hyperintense focus in the left posterolateral spinal cord just below the C5-C6 interspace.  She return for a contrasted MRI of the cervical spine 719 2021.  It showed that the abnormal focus in the spinal cord enhanced after contrast.  Lab work: CSF 12/20/2019 showed 5 oligoclonal bands present in both the CSF and serum (this is more consistent with a systemic inflammatory response and not intrathecal production).  This makes MS less likely and I discussed this with her.  Vascular risk factors:   Type 2 IDDM, HTN, 1/4 ppd smoker now (1/2 to 1 ppd x many years though).      We discussed smoking cessation.     REVIEW OF SYSTEMS: Constitutional: No fevers, chills, sweats, or change in appetite.  She notes fatigue. Eyes: No visual changes, double vision, eye pain Ear, nose and throat: No hearing loss, ear pain, nasal congestion, sore throat Cardiovascular: No chest pain, palpitations Respiratory: No shortness of breath at rest or with exertion.   No wheezes GastrointestinaI: No nausea, vomiting, diarrhea, abdominal pain, fecal incontinence Genitourinary:Mild urinary urgency without incontinence.   Musculoskeletal:As above  integumentary: No rash, pruritus, skin lesions Neurological: as above Psychiatric: No depression at this time.  No anxiety Endocrine: No palpitations, diaphoresis, change in appetite, change in weigh or increased thirst Hematologic/Lymphatic: No anemia, purpura, petechiae. Allergic/Immunologic: No itchy/runny eyes, nasal congestion, recent allergic reactions, rashes  ALLERGIES: Allergies  Allergen Reactions  . Penicillins Anaphylaxis  . Xigduo Xr [Dapagliflozin-Metformin Hcl Er] Other (See Comments)    Yeast  infection.  HOME MEDICATIONS:  Current Outpatient Medications:  .  AMBULATORY NON FORMULARY MEDICATION, Glucometer device and lancets and test strips.  Dx. Diabetes 250.0 controlled   Test 1 to 3 times a day., Disp: 1 Device, Rfl: 0 .  atorvastatin (LIPITOR) 20 MG tablet, Take 1 tablet (20 mg total) by mouth daily., Disp: 90 tablet, Rfl: 3 .  Biotin w/ Vitamins C & E (HAIR/SKIN/NAILS PO), Take 1 capsule by mouth at bedtime., Disp: , Rfl:  .  buPROPion (WELLBUTRIN SR) 150 MG 12 hr tablet, Take 1 tablet (150 mg total) by mouth 2 (two) times daily., Disp: 180 tablet, Rfl: 1 .  clotrimazole (LOTRIMIN) 1 % cream, Apply 1 application topically 2 (two) times daily. (Patient taking differently: Apply 1 application topically 2 (two) times daily as needed (yeast/skin irritation.). ), Disp: 60 g, Rfl: 0 .  Continuous Blood Gluc Receiver (DEXCOM G6 RECEIVER) DEVI, 1 Device by Does not apply route 4 (four) times daily as needed., Disp: 1 each, Rfl: 0 .  Continuous Blood Gluc Sensor (DEXCOM G6 SENSOR) MISC, Use to check blood sugars, Disp: 1 each, Rfl: 11 .  cyclobenzaprine (FLEXERIL) 10 MG tablet, TAKE 1 TABLET BY MOUTH AT BEDTIME. (Patient taking differently: Take 10 mg by mouth daily as needed (spasms.). ), Disp: 30 tablet, Rfl: 0 .  diclofenac sodium (VOLTAREN) 1 % GEL, Apply 4 g topically 4 (four) times daily. To affected joint. (Patient taking differently: Apply 2-4 g topically 4 (four) times daily as needed (joint pain). ), Disp: 100 g, Rfl: 1 .  escitalopram (LEXAPRO) 5 MG tablet, TAKE 1 TABLET BY MOUTH EVERY DAY (Patient taking differently: Take 5 mg by mouth at bedtime. ), Disp: 90 tablet, Rfl: 0 .  estradiol (ESTRACE) 2 MG tablet, Take 1 tablet (2 mg total) by mouth daily. (Patient taking differently: Take 2 mg by mouth at bedtime. ), Disp: 90 tablet, Rfl: 3 .  gabapentin (NEURONTIN) 300 MG capsule, One tab PO qPM abd one po qHS (Patient taking differently: Take 300 mg by mouth in the morning and at  bedtime. ), Disp: 90 capsule, Rfl: 5 .  hydrochlorothiazide (HYDRODIURIL) 25 MG tablet, TAKE 1 TABLET BY MOUTH DAILY. (Patient taking differently: Take 25 mg by mouth at bedtime. TAKE 1 TABLET BY MOUTH DAILY.), Disp: 90 tablet, Rfl: 3 .  insulin degludec (TRESIBA FLEXTOUCH) 100 UNIT/ML FlexTouch Pen, INJECT 13 UNITS SUBCUTANEOUSLY AT BEDTIME (Patient taking differently: Inject 13 Units into the skin at bedtime. ), Disp: 3 mL, Rfl: 3 .  Insulin Pen Needle (PEN NEEDLES) 31G X 6 MM MISC, Injected insulin into skin once daily., Disp: 100 each, Rfl: prn .  meloxicam (MOBIC) 15 MG tablet, One tab PO qAM with a meal for 2 weeks, then daily prn pain. (Patient taking differently: Take 15 mg by mouth daily. ), Disp: 30 tablet, Rfl: 3 .  metFORMIN (GLUCOPHAGE) 1000 MG tablet, Take 1 tablet (1,000 mg total) by mouth 2 (two) times daily with a meal., Disp: 180 tablet, Rfl: 3 .  OZEMPIC, 1 MG/DOSE, 4 MG/3ML SOPN, Inject 1 mg into the skin every Friday. , Disp: , Rfl:  .  phentermine 15 MG capsule, Take 1 capsule (15 mg total) by mouth every morning. (Patient not taking: Reported on 01/07/2020), Disp: 30 capsule, Rfl: 0 .  topiramate (TOPAMAX) 25 MG tablet, Take 1 tablet (25 mg total) by mouth 2 (two) times daily., Disp: 180 tablet, Rfl: 0 .  triamcinolone cream (KENALOG) 0.1 %, Apply 1 application topically 2 (two)  times daily. (Patient taking differently: Apply 1 application topically 2 (two) times daily as needed (skin irritation.). ), Disp: 80 g, Rfl: 0  PAST MEDICAL HISTORY: Past Medical History:  Diagnosis Date  . Chronic kidney disease    stage 3   . Depression   . Diabetes mellitus without complication (HCC)   . Hypertension     PAST SURGICAL HISTORY: Past Surgical History:  Procedure Laterality Date  . ABDOMINAL HYSTERECTOMY     both uterus and ovaries removed.   . COLONOSCOPY  10 + years ago    IN [facility]-normal exam per pt    FAMILY HISTORY: Family History  Problem Relation Age of Onset   . Cancer Mother        cervical, breast  . Multiple sclerosis Mother   . Multiple sclerosis Father   . Heart attack Brother   . Cancer Maternal Aunt        lung  . Diabetes Maternal Aunt   . Hypertension Maternal Aunt   . Stroke Maternal Grandmother   . Multiple sclerosis Sister   . Diabetes Sister   . Diabetes Maternal Aunt   . Hypertension Maternal Aunt   . Multiple sclerosis Paternal Uncle   . Multiple sclerosis Half-Sister   . Colon cancer Neg Hx   . Esophageal cancer Neg Hx   . Rectal cancer Neg Hx   . Stomach cancer Neg Hx   . Colon polyps Neg Hx     SOCIAL HISTORY:    PHYSICAL EXAM   General: The patient is well-developed and well-nourished and in no acute distress  HEENT:  Head is [place]/AT.  Sclera are anicteric.    Neck: No carotid bruits are noted.  The neck and upper back is mildly tender over the paraspinal muscles, trapezius muscles and supra spinatus muscles  Neurologic Exam  Mental status: The patient is alert and oriented x 3 at the time of the examination. The patient has apparent normal recent and remote memory, with an apparently normal attention span and concentration ability.   Speech is normal.  Cranial nerves: Extraocular movements are full.  Facial strength was normal no obvious hearing deficits are noted.  Motor:  Muscle bulk is normal.   Tone is normal. Strength is  5 / 5 in all 4 extremities.   Sensory: She reported altered sensation to touch and temperature in the mid left palm and in the first through fourth fingers.  Elsewhere in the arms sensation was symmetric.  Sensation was normal in the legs.  Coordination: Cerebellar testing reveals good finger-nose-finger and heel-to-shin bilaterally.  Gait and station: Station is normal.   Gait is arthritic and tandem gait is wide.. Romberg is negative.   Reflexes: Deep tendon reflexes are symmetric and normal bilaterally.      ASSESSMENT AND PLAN  Demyelinating disease of central nervous  system (HCC) - Plan: Neuromyelitis optica autoab, IgG, Neuromyelitis optica autoab, IgG  DDD (degenerative disc disease), cervical - Plan: NCV with EMG(electromyography)  Numbness - Plan: NCV with EMG(electromyography), Neuromyelitis optica autoab, IgG, Neuromyelitis optica autoab, IgG  Transverse myelitis (HCC) - Plan: Pan-ANCA, Sedimentation rate, ANA  White matter abnormality on MRI of brain - Plan: Pan-ANCA, Sedimentation rate, ANA  1.    NCV/EMG study today shows moderate bilateral median neuropathies at the wrist, mild right C5-C6 chronic radiculopathy and possible left C5-C6 chronic radiculopathy. 2.    I discussed with her that her symptoms could be from a combination of spinal cord lesion, carpal tunnel  syndrome and radiculopathy.  Because the NCV/EMG study was worse on the right than the left, the spinal cord plaque probably play some role 3.    To further evaluate the abnormal signal on the spinal cord we will check some lab work today including ANA, ESR, ANCA and NMO antibody 4.    She will return to see me in 2 to 3 months or sooner if there are new or worsening neurologic symptoms.     Jamaine Quintin A. Felecia Shelling, MD, [facility] 6/80/3212, 24:82 AM Certified in Neurology, Clinical Neurophysiology, Sleep Medicine and Neuroimaging  [facility] [address] [place], [place] [address] [phone]

## 2020-01-16 LAB — ANA: Anti Nuclear Antibody (ANA): NEGATIVE

## 2020-01-16 LAB — PAN-ANCA
ANCA Proteinase 3: <3.5 U/mL (ref 0.0–3.5)
Atypical pANCA: <1:20 {titer}
C-ANCA: <1:20 {titer}
Myeloperoxidase Ab: <9 U/mL (ref 0.0–9.0)
P-ANCA: <1:20 {titer}

## 2020-01-16 LAB — SEDIMENTATION RATE: Sed Rate: 2 mm/hr (ref 0–40)

## 2020-01-16 LAB — NEUROMYELITIS OPTICA AUTOAB, IGG: NMO IgG Autoantibodies: <1.5 U/mL (ref 0.0–3.0)

## 2020-01-17 ENCOUNTER — Encounter (HOSPITAL_COMMUNITY): Admission: RE | Payer: Self-pay | Source: Home / Self Care

## 2020-01-17 ENCOUNTER — Ambulatory Visit (HOSPITAL_COMMUNITY)
Admission: RE | Admit: 2020-01-17 | Payer: BC Managed Care – PPO | Source: Home / Self Care | Admitting: Orthopedic Surgery

## 2020-01-17 SURGERY — ANTERIOR CERVICAL DECOMPRESSION/DISCECTOMY FUSION 2 LEVELS
Anesthesia: General

## 2020-01-22 ENCOUNTER — Encounter: Payer: Self-pay | Admitting: Physician Assistant

## 2020-01-22 DIAGNOSIS — R590 Localized enlarged lymph nodes: Secondary | ICD-10-CM

## 2020-01-23 ENCOUNTER — Other Ambulatory Visit: Payer: Self-pay

## 2020-01-23 DIAGNOSIS — E119 Type 2 diabetes mellitus without complications: Secondary | ICD-10-CM

## 2020-01-23 DIAGNOSIS — N951 Menopausal and female climacteric states: Secondary | ICD-10-CM

## 2020-01-23 MED ORDER — ESTRADIOL 2 MG PO TABS: 2.0000 mg | ORAL_TABLET | Freq: Every day | ORAL | 1 refills | Status: DC

## 2020-01-23 MED ORDER — TRESIBA FLEXTOUCH 100 UNIT/ML ~~LOC~~ SOPN: PEN_INJECTOR | SUBCUTANEOUS | 3 refills | Status: DC

## 2020-01-23 NOTE — Telephone Encounter (Signed)
U/S referral for left axilla pended.

## 2020-01-30 ENCOUNTER — Other Ambulatory Visit: Payer: Self-pay | Admitting: Physician Assistant

## 2020-01-30 DIAGNOSIS — R590 Localized enlarged lymph nodes: Secondary | ICD-10-CM

## 2020-02-13 ENCOUNTER — Other Ambulatory Visit: Payer: Self-pay | Admitting: Neurology

## 2020-02-13 DIAGNOSIS — F330 Major depressive disorder, recurrent, mild: Secondary | ICD-10-CM

## 2020-02-13 MED ORDER — ESCITALOPRAM OXALATE 5 MG PO TABS: 5.0000 mg | ORAL_TABLET | Freq: Every day | ORAL | 0 refills | Status: DC

## 2020-02-15 ENCOUNTER — Ambulatory Visit
Admission: RE | Admit: 2020-02-15 | Discharge: 2020-02-15 | Disposition: A | Discharge status: Home / Self Care | Payer: BC Managed Care – PPO | Source: Ambulatory Visit | Attending: Physician Assistant | Admitting: Physician Assistant

## 2020-02-15 ENCOUNTER — Other Ambulatory Visit: Payer: Self-pay

## 2020-02-15 ENCOUNTER — Ambulatory Visit
Admission: RE | Admit: 2020-02-15 | Discharge: 2020-02-15 | Disposition: A | Discharge status: Home / Self Care | Payer: Medicaid Other | Source: Ambulatory Visit | Attending: Physician Assistant | Admitting: Physician Assistant

## 2020-02-15 DIAGNOSIS — R590 Localized enlarged lymph nodes: Secondary | ICD-10-CM

## 2020-02-15 IMAGING — US US AXILLARY LEFT
1 series · 11 of 11 positions shown · non-contrast
Comparison: Previous exam(s).

CLINICAL DATA: 1 year follow-up of probable benign left axillary
lymph nodes.

EXAM:
DIGITAL DIAGNOSTIC BILATERAL MAMMOGRAM WITH CAD AND TOMO
ULTRASOUND LEFT BREAST

[Series 1: us axillary left · 0.07mm/px · 11 of 11 slices shown]
[im 1/11]
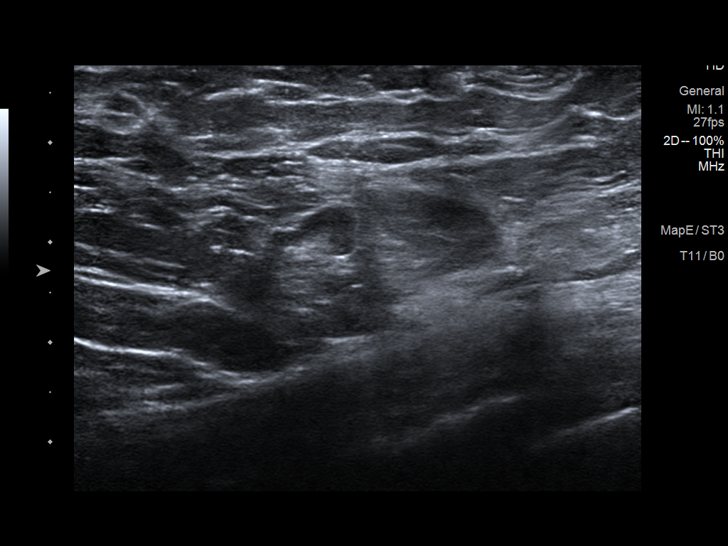
[im 2/11]
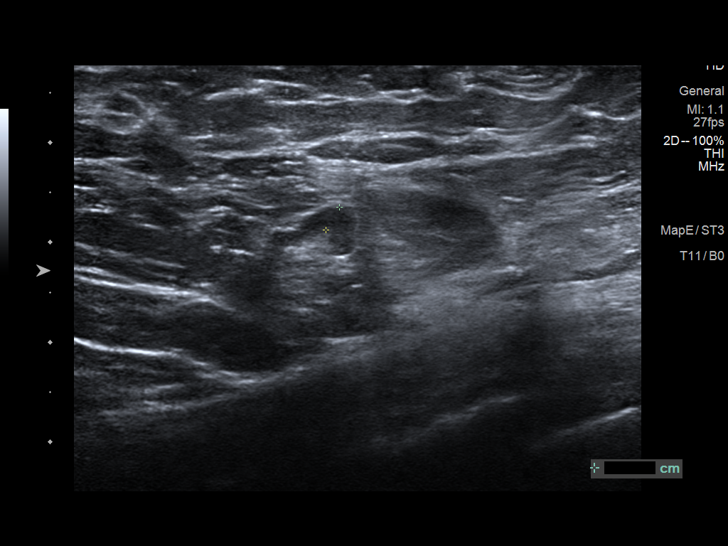
[im 3/11]
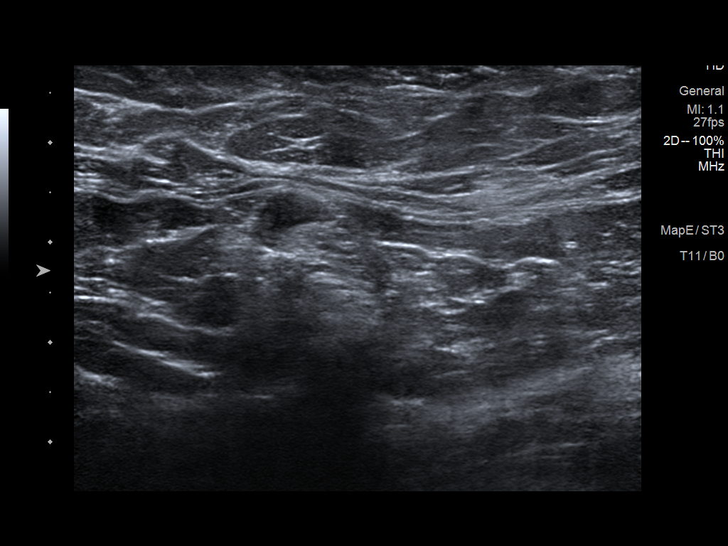
[im 4/11]
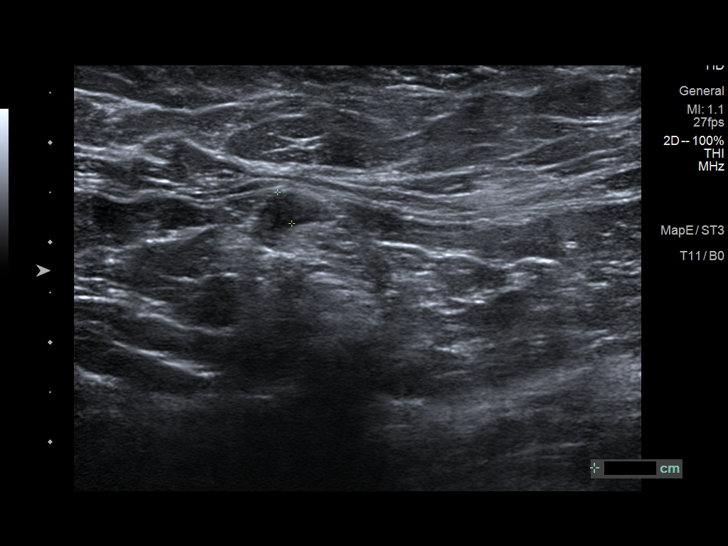
[im 5/11]
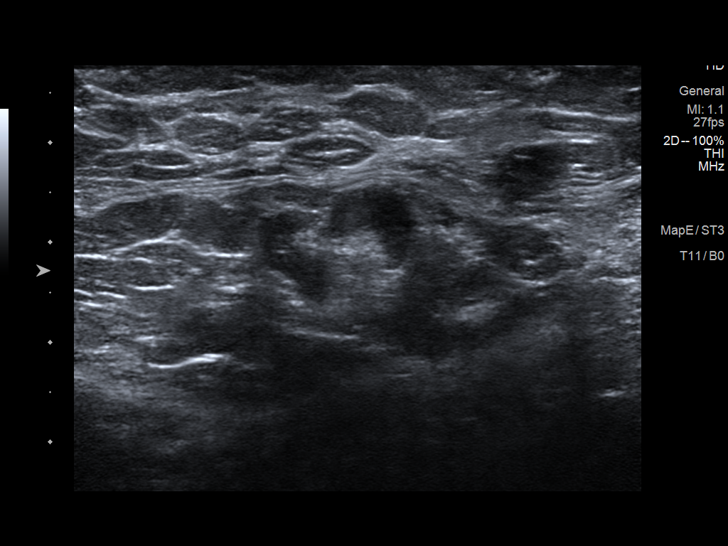
[im 6/11]
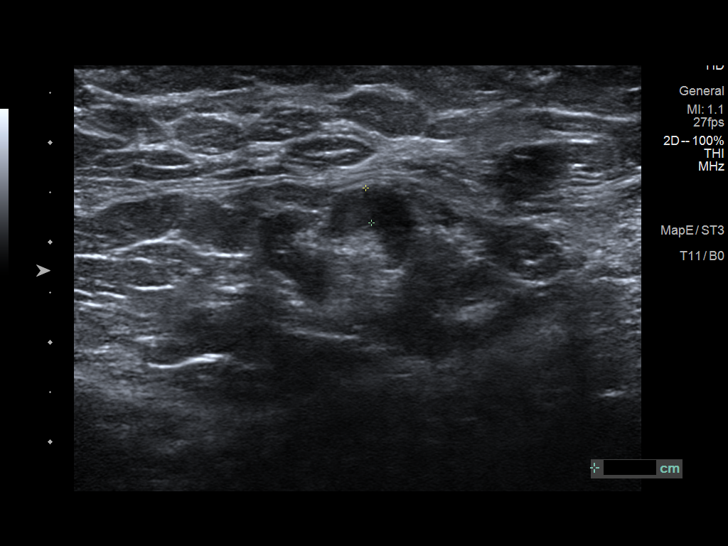
[im 7/11]
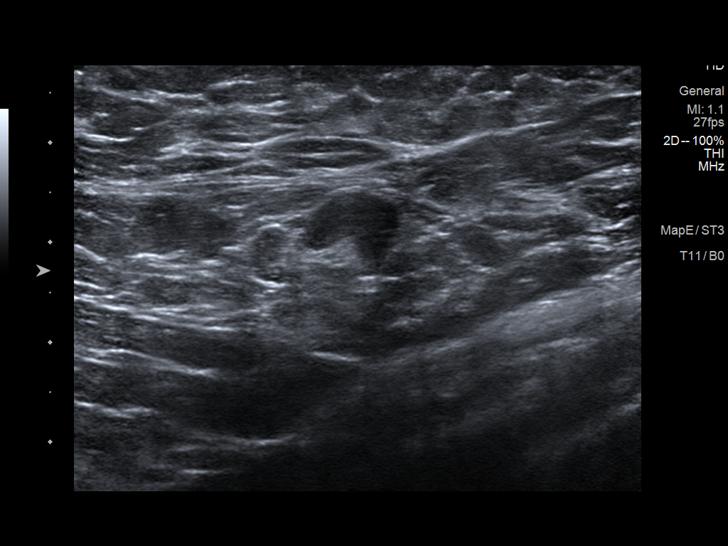
[im 8/11]
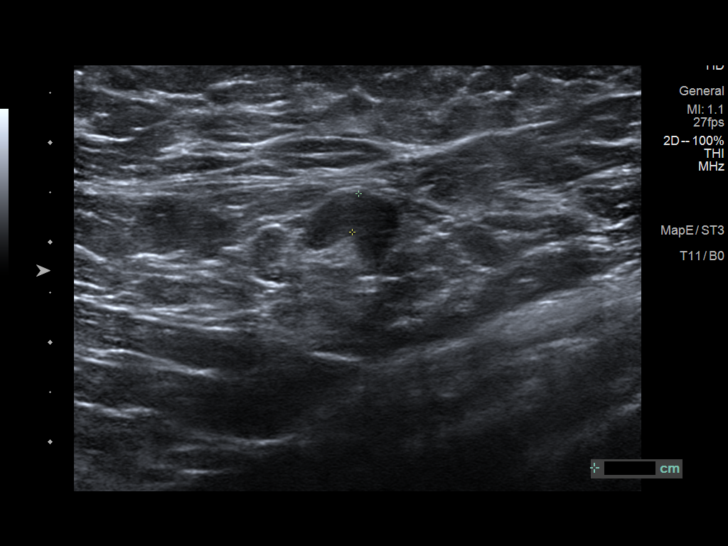
[im 9/11]
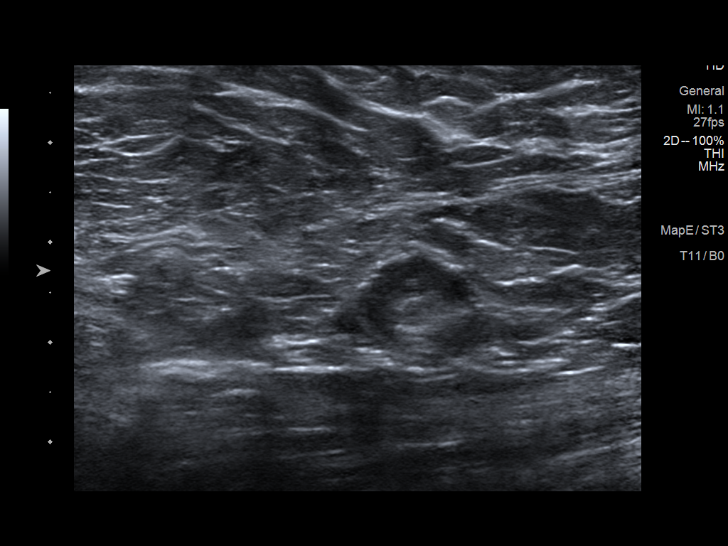
[im 10/11]
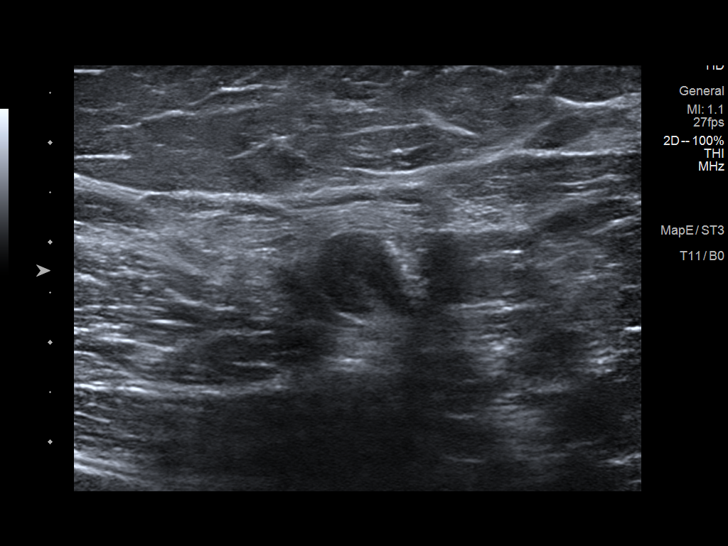
[im 11/11]
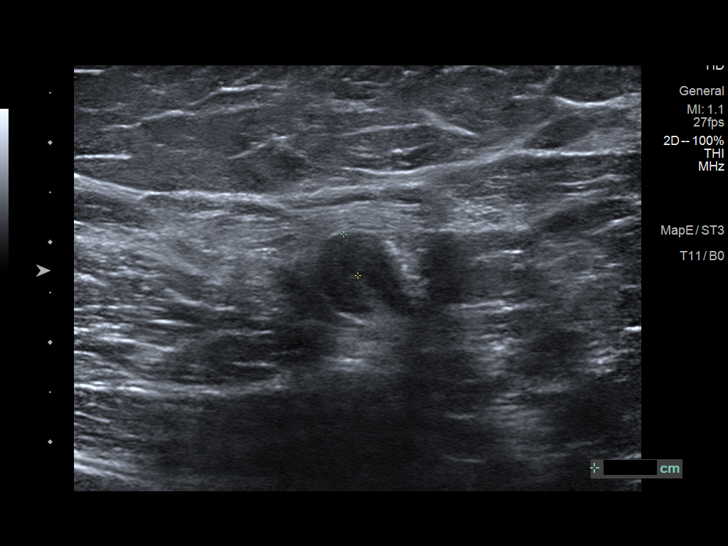

[11 of 11 positions shown; findings below may reference images not displayed]

ACR Breast Density Category b: There are scattered areas of
fibroglandular density.
FINDINGS: No suspicious mass, malignant type microcalcifications or distortion
detected in either breast. The lymph nodes in the left axilla have a
similar appearance to prior exam dated [DATE].

Mammographic images were processed with CAD.

Targeted ultrasound is performed, showing stable lymph nodes with a
benign appearance in the left axilla. There is no enlarged
adenopathy.
IMPRESSION: No evidence of malignancy in either breast.

RECOMMENDATION:
Bilateral screening mammogram in 1 year is recommended.

I have discussed the findings and recommendations with the patient.
If applicable, a reminder letter will be sent to the patient
regarding the next appointment.

BI-RADS CATEGORY  2: Benign.

## 2020-02-15 IMAGING — MG DIGITAL DIAGNOSTIC BILAT W/ TOMO W/ CAD
8 series · 8 of 24 positions shown · non-contrast
Comparison: Previous exam(s).

CLINICAL DATA: 1 year follow-up of probable benign left axillary
lymph nodes.

EXAM:
DIGITAL DIAGNOSTIC BILATERAL MAMMOGRAM WITH CAD AND TOMO
ULTRASOUND LEFT BREAST

[R MLO synth-2D]
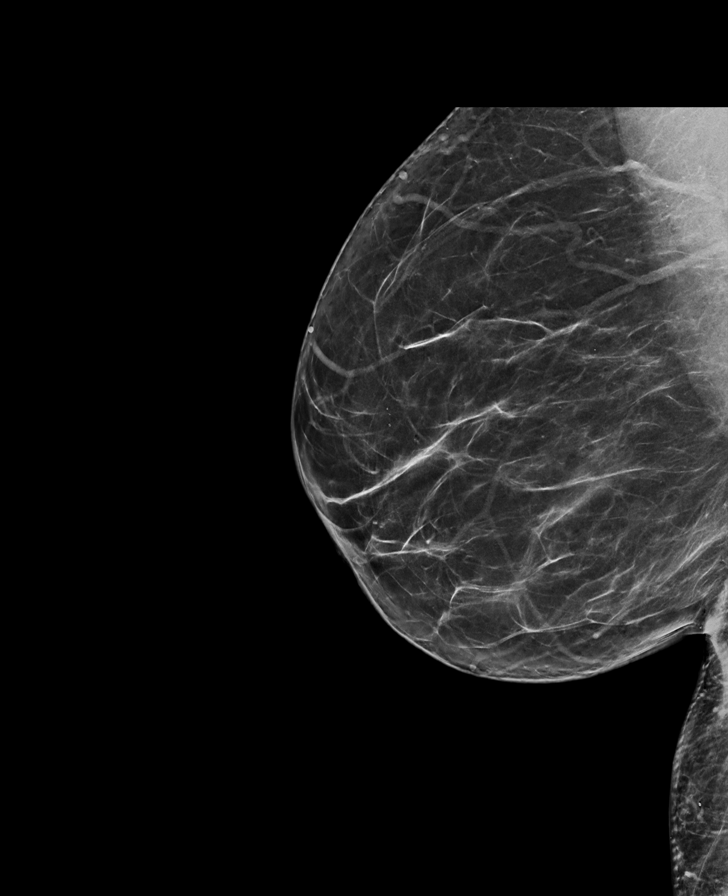

[L MLO synth-2D]
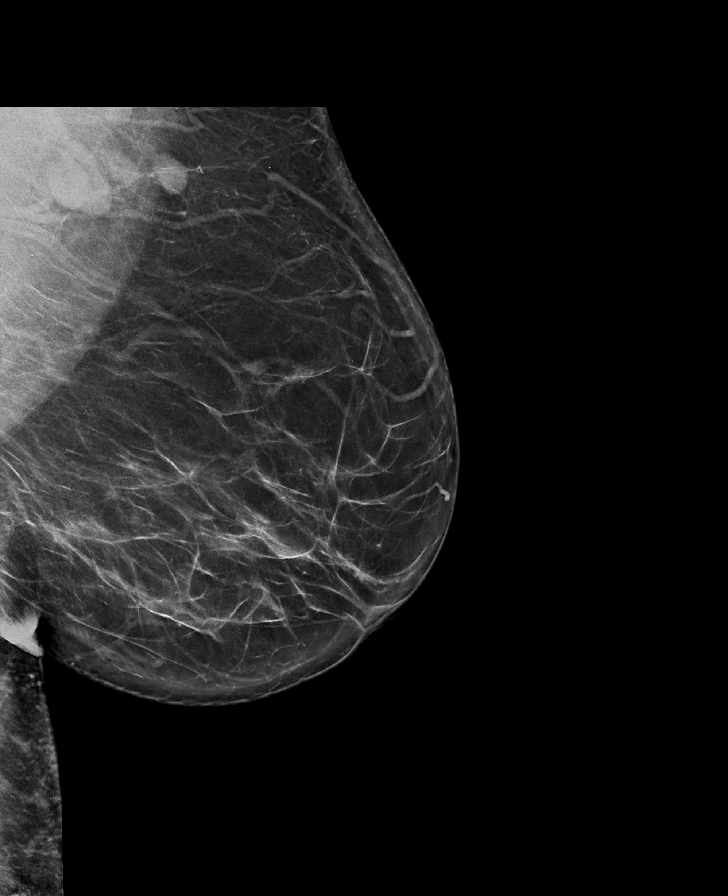

[R CC synth-2D]
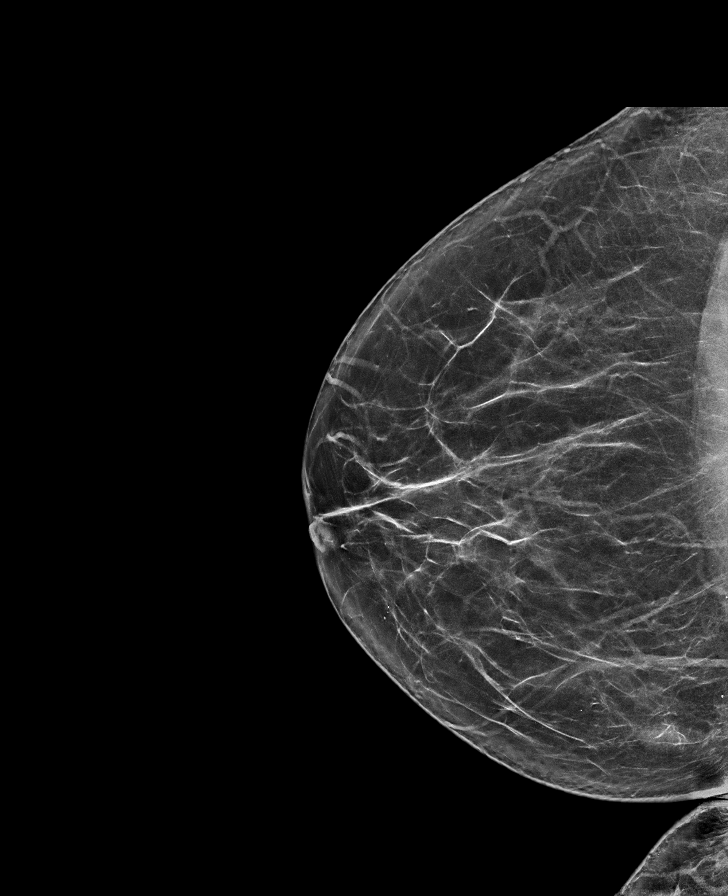

[L CC synth-2D]
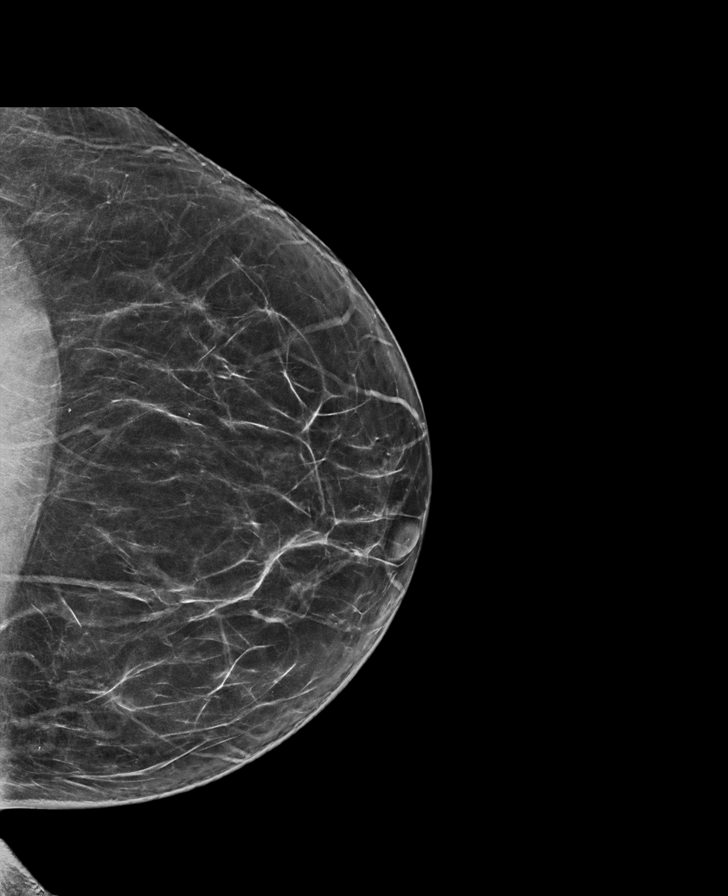

[R MLO tomo · tomo slice 39/78.0]
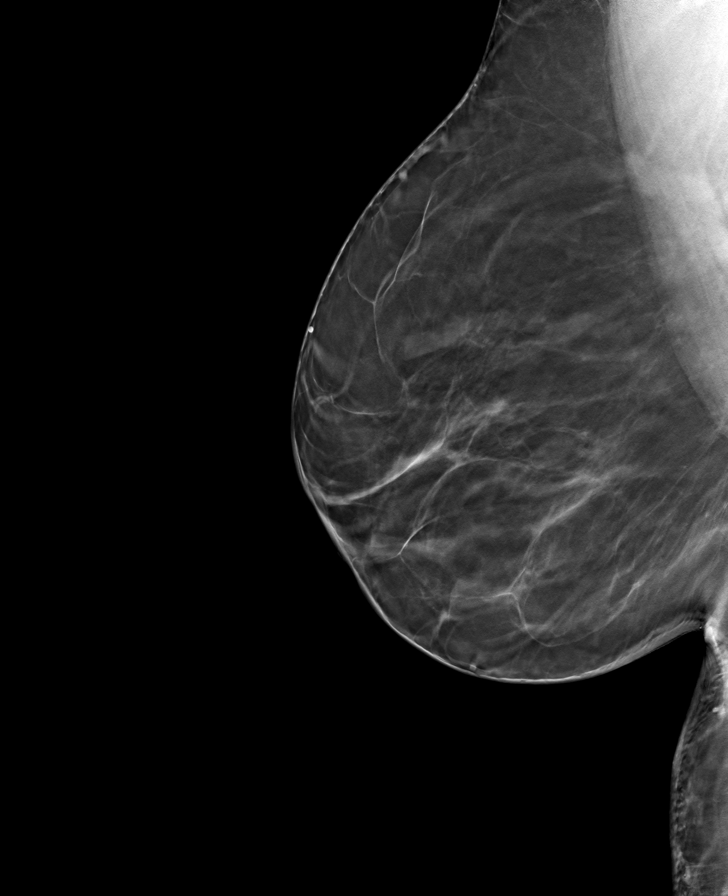

[R CC tomo · tomo slice 37/74.0]
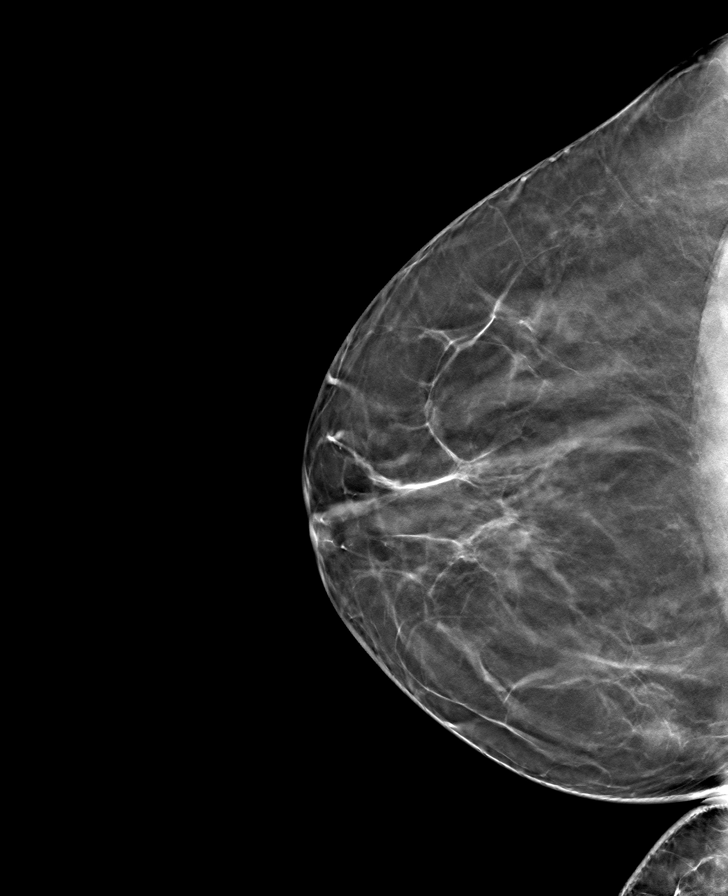

[L MLO tomo · tomo slice 44/87.0]
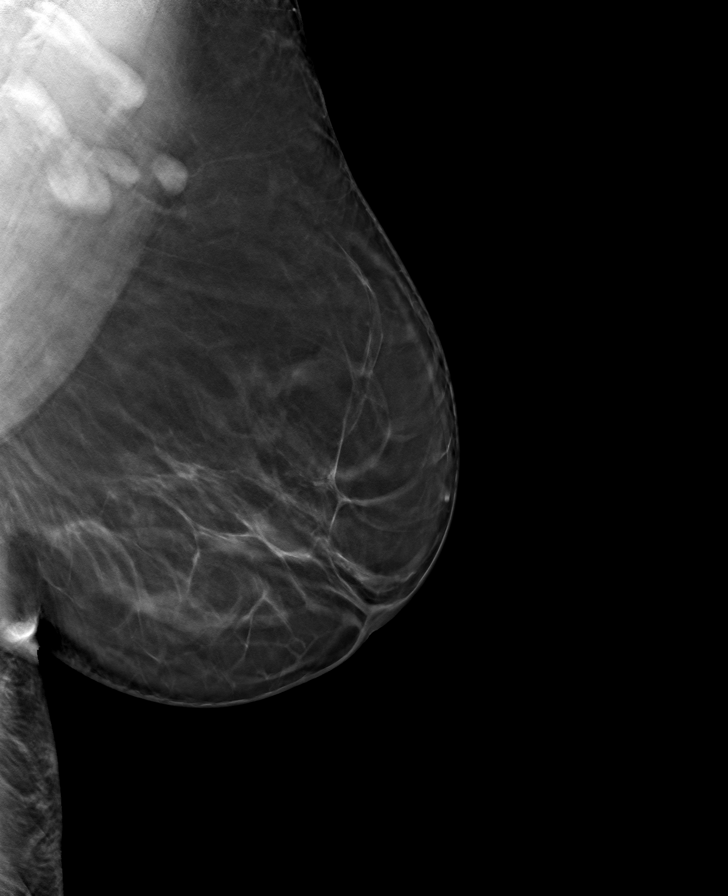

[L CC tomo · tomo slice 37/74.0]
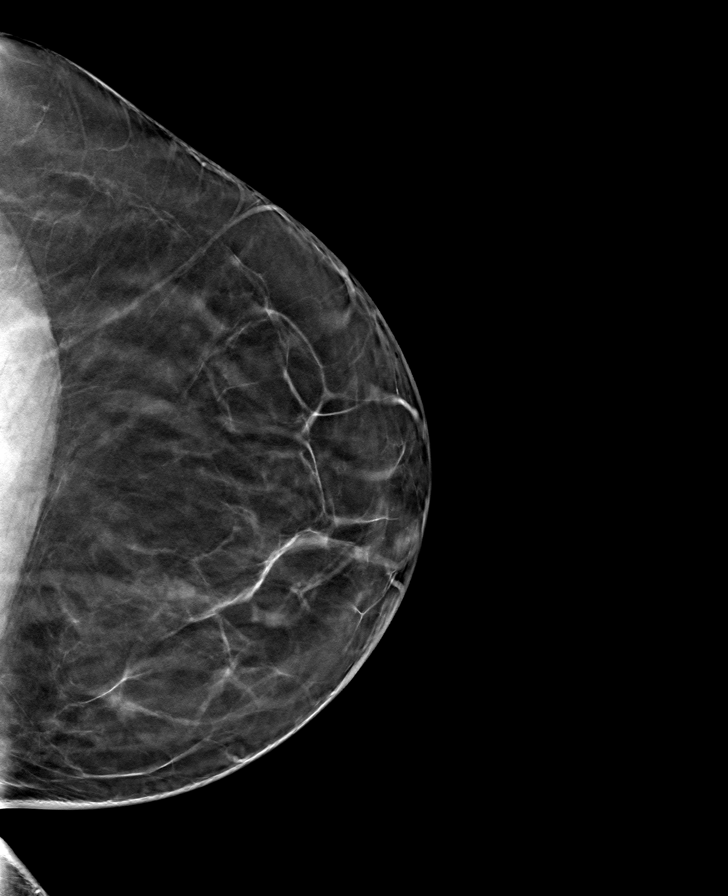

[8 of 24 positions shown; findings below may reference images not displayed]

ACR Breast Density Category b: There are scattered areas of
fibroglandular density.
FINDINGS: No suspicious mass, malignant type microcalcifications or distortion
detected in either breast. The lymph nodes in the left axilla have a
similar appearance to prior exam dated [DATE].

Mammographic images were processed with CAD.

Targeted ultrasound is performed, showing stable lymph nodes with a
benign appearance in the left axilla. There is no enlarged
adenopathy.
IMPRESSION: No evidence of malignancy in either breast.

RECOMMENDATION:
Bilateral screening mammogram in 1 year is recommended.

I have discussed the findings and recommendations with the patient.
If applicable, a reminder letter will be sent to the patient
regarding the next appointment.

BI-RADS CATEGORY  2: Benign.

## 2020-02-15 NOTE — Telephone Encounter (Signed)
No evidence of malignancy.  Follow up 1 year screening mammogram.

## 2020-02-18 ENCOUNTER — Ambulatory Visit (INDEPENDENT_AMBULATORY_CARE_PROVIDER_SITE_OTHER): Payer: 59 | Admitting: Physician Assistant

## 2020-02-18 ENCOUNTER — Other Ambulatory Visit: Payer: Self-pay

## 2020-02-18 VITALS — BP 105/59 | HR 86 | Ht 66.0 in | Wt 214.0 lb

## 2020-02-18 DIAGNOSIS — G5603 Carpal tunnel syndrome, bilateral upper limbs: Secondary | ICD-10-CM

## 2020-02-18 DIAGNOSIS — M5030 Other cervical disc degeneration, unspecified cervical region: Secondary | ICD-10-CM

## 2020-02-18 DIAGNOSIS — Z6834 Body mass index (BMI) 34.0-34.9, adult: Secondary | ICD-10-CM

## 2020-02-18 DIAGNOSIS — E1122 Type 2 diabetes mellitus with diabetic chronic kidney disease: Secondary | ICD-10-CM

## 2020-02-18 DIAGNOSIS — Z794 Long term (current) use of insulin: Secondary | ICD-10-CM | POA: Diagnosis not present

## 2020-02-18 DIAGNOSIS — I10 Essential (primary) hypertension: Secondary | ICD-10-CM

## 2020-02-18 DIAGNOSIS — R8290 Unspecified abnormal findings in urine: Secondary | ICD-10-CM

## 2020-02-18 DIAGNOSIS — E6609 Other obesity due to excess calories: Secondary | ICD-10-CM

## 2020-02-18 DIAGNOSIS — Z23 Encounter for immunization: Secondary | ICD-10-CM

## 2020-02-18 DIAGNOSIS — R350 Frequency of micturition: Secondary | ICD-10-CM | POA: Diagnosis not present

## 2020-02-18 DIAGNOSIS — E1169 Type 2 diabetes mellitus with other specified complication: Secondary | ICD-10-CM

## 2020-02-18 LAB — POCT URINALYSIS DIP (CLINITEK)
Blood, UA: NEGATIVE
Glucose, UA: NEGATIVE mg/dL
Leukocytes, UA: NEGATIVE
Nitrite, UA: NEGATIVE
Spec Grav, UA: 1.02 (ref 1.010–1.025)
Urobilinogen, UA: 0.2 E.U./dL
pH, UA: 6.5 (ref 5.0–8.0)

## 2020-02-18 LAB — POCT GLYCOSYLATED HEMOGLOBIN (HGB A1C): Hemoglobin A1C: 6.1 % — AB (ref 4.0–5.6)

## 2020-02-18 MED ORDER — PHENTERMINE HCL 15 MG PO CAPS: 15.0000 mg | ORAL_CAPSULE | ORAL | 0 refills | Status: DC

## 2020-02-18 MED ORDER — HYDROCHLOROTHIAZIDE 25 MG PO TABS: 25.0000 mg | ORAL_TABLET | Freq: Every day | ORAL | 1 refills | Status: DC

## 2020-02-18 MED ORDER — TOPIRAMATE 25 MG PO TABS: 25.0000 mg | ORAL_TABLET | Freq: Two times a day (BID) | ORAL | 0 refills | Status: DC

## 2020-02-18 MED ORDER — TRAMADOL HCL 50 MG PO TABS: 50.0000 mg | ORAL_TABLET | Freq: Two times a day (BID) | ORAL | 0 refills | Status: AC | PRN

## 2020-02-18 MED ORDER — METFORMIN HCL 1000 MG PO TABS: 1000.0000 mg | ORAL_TABLET | Freq: Two times a day (BID) | ORAL | 3 refills | Status: DC

## 2020-02-18 NOTE — Progress Notes (Signed)
Subjective:    Patient ID: Nina Trujillo, female    DOB: [birth year], 54 y.o.   MRN: [id number]  HPI  Pt is a 54 yo female with HTN, T2DM, HLD who presents to the clinic for 27month follow up and medication refills.   Pt is doing good. She is not checking her sugars. No hypoglycemic events. No open sores or wounds. She is trying to be more active and eat less.   Pt is seeing neurology about the concerning MRI of brain and white matter findings.   She continues to have a lot of neck and carpal tunnel pain. She request something for break through pain.   .. Active Ambulatory Problems    Diagnosis Date Noted   Tobacco dependence 05/31/2014   Abnormal weight gain 05/31/2014   Obese 05/31/2014   Essential hypertension, benign 05/31/2014   Diabetes mellitus without complication ([place]) 67/59/1638   Menopausal symptoms 05/31/2014   Hidradenitis suppurativa 12/13/2014   CKD (chronic kidney disease) stage 3, GFR 30-59 ml/min (HCC) 12/16/2014   Class 1 obesity due to excess calories with serious comorbidity and body mass index (BMI) of 34.0 to 34.9 in adult 03/21/2015   Depression 03/21/2015   Microalbuminuria due to type 2 diabetes mellitus ([place]) 08/25/2015   Allergic rhinitis due to allergen 08/08/2017   Upper back pain 05/19/2018   Benign paroxysmal positional vertigo due to bilateral vestibular disorder 05/19/2018   Mixed hyperlipidemia 05/22/2018   Dyslipidemia, goal LDL below 70 05/22/2018   DDD (degenerative disc disease), cervical 04/04/2019   Type 2 diabetes mellitus with chronic kidney disease, without long-term current use of insulin ([place]) 06/25/2019   Acute pain of both knees 06/25/2019   Bloating 11/20/2019   Epigastric pain 11/20/2019   Carpal tunnel syndrome, bilateral 11/23/2019   Demyelinating disease of central nervous system ([place]) 11/26/2019   White matter abnormality on MRI of brain 01/15/2020   Numbness 01/15/2020   Resolved  Ambulatory Problems    Diagnosis Date Noted   Cervical radiculopathy 04/09/2019   Cervical pain (neck) 11/20/2019   Past Medical History:  Diagnosis Date   Chronic kidney disease    Hypertension       Review of Systems  All other systems reviewed and are negative.      Objective:   Physical Exam Vitals reviewed.  Constitutional:      Appearance: Normal appearance.  HENT:     Head: Normocephalic.  Cardiovascular:     Rate and Rhythm: Normal rate and regular rhythm.     Pulses: Normal pulses.  Pulmonary:     Effort: Pulmonary effort is normal.     Breath sounds: Normal breath sounds.  Neurological:     General: No focal deficit present.     Mental Status: She is alert and oriented to person, place, and time.  Psychiatric:        Mood and Affect: Mood normal.           Assessment & Plan:  Marland KitchenMarland KitchenSuhaylah was seen today for diabetes.  Diagnoses and all orders for this visit:  Type 2 diabetes mellitus with other specified complication, with long-term current use of insulin (HCC) -     POCT glycosylated hemoglobin (Hb A1C)  Abnormal urine odor -     POCT URINALYSIS DIP (CLINITEK) -     Urine Culture  Urinary frequency -     POCT URINALYSIS DIP (CLINITEK) -     Urine Culture  Class 1 obesity due to excess calories  with serious comorbidity and body mass index (BMI) of 34.0 to 34.9 in adult -     phentermine 15 MG capsule; Take 1 capsule (15 mg total) by mouth every morning. -     topiramate (TOPAMAX) 25 MG tablet; Take 1 tablet (25 mg total) by mouth 2 (two) times daily. -     metFORMIN (GLUCOPHAGE) 1000 MG tablet; Take 1 tablet (1,000 mg total) by mouth 2 (two) times daily with a meal.  Type 2 diabetes mellitus with chronic kidney disease, without long-term current use of insulin, unspecified CKD stage (HCC) -     metFORMIN (GLUCOPHAGE) 1000 MG tablet; Take 1 tablet (1,000 mg total) by mouth 2 (two) times daily with a meal.  Essential hypertension, benign -      hydrochlorothiazide (HYDRODIURIL) 25 MG tablet; Take 1 tablet (25 mg total) by mouth at bedtime. TAKE 1 TABLET BY MOUTH DAILY.  Carpal tunnel syndrome, bilateral -     traMADol (ULTRAM) 50 MG tablet; Take 1 tablet (50 mg total) by mouth every 12 (twelve) hours as needed for up to 5 days.  DDD (degenerative disc disease), cervical -     traMADol (ULTRAM) 50 MG tablet; Take 1 tablet (50 mg total) by mouth every 12 (twelve) hours as needed for up to 5 days.  Other orders -     Flu Vaccine QUAD 36+ mos IM   .[name] Results for orders placed or performed in visit on 02/18/20  Urine Culture   Specimen: Urine  Result Value Ref Range   MICRO NUMBER: [id number]    SPECIMEN QUALITY: Adequate    Sample Source URINE    STATUS: FINAL    Result:      Growth of mixed flora was isolated, suggesting probable contamination. No further testing will be performed. If clinically indicated, recollection using a method to minimize contamination, with prompt transfer to Urine Culture Transport Tube, is  recommended.   POCT URINALYSIS DIP (CLINITEK)  Result Value Ref Range   Color, UA yellow yellow   Clarity, UA clear clear   Glucose, UA negative negative mg/dL   Bilirubin, UA small (A) negative   Ketones, POC UA trace (5) (A) negative mg/dL   Spec Grav, UA 1.020 1.010 - 1.025   Blood, UA negative negative   pH, UA 6.5 5.0 - 8.0   POC PROTEIN,UA trace negative, trace   Urobilinogen, UA 0.2 0.2 or 1.0 E.U./dL   Nitrite, UA Negative Negative   Leukocytes, UA Negative Negative  POCT glycosylated hemoglobin (Hb A1C)  Result Value Ref Range   Hemoglobin A1C 6.1 (A) 4.0 - 5.6 %   HbA1c POC (<> result, manual entry)     HbA1c, POC (prediabetic range)     HbA1c, POC (controlled diabetic range)      Lab Results  Component Value Date   HGBA1C 6.1 (A) 02/18/2020   A1C stable.  Stay on same medication.  BP to goal. Refilled HCTZ. On statin.  Needs eye exam.  Foot exam UTD.  Pneumonia/flu/covid vaccine  UTD.   [name].PDMP reviewed during this encounter. Breakthrough pain with tramadol.  No concerns.   Weight management discussed. Sent topamax and phentermine.   No major concerns in UA will culture.  Increase hydration. Consider cranberry juice.

## 2020-02-20 ENCOUNTER — Other Ambulatory Visit: Payer: Self-pay | Admitting: Neurology

## 2020-02-20 DIAGNOSIS — R8290 Unspecified abnormal findings in urine: Secondary | ICD-10-CM

## 2020-02-20 LAB — URINE CULTURE
MICRO NUMBER:: 11031016
SPECIMEN QUALITY:: ADEQUATE

## 2020-02-20 NOTE — Progress Notes (Signed)
No bacterial growth in urine. If you continue to have urinary symptoms let me know. I would like to recheck urine to make sure ketones and bilirubin resolve in 2 weeks.

## 2020-02-22 ENCOUNTER — Ambulatory Visit: Payer: 59 | Admitting: Sports Medicine

## 2020-02-25 ENCOUNTER — Encounter: Payer: Self-pay | Admitting: Physician Assistant

## 2020-03-04 ENCOUNTER — Other Ambulatory Visit: Payer: Self-pay | Admitting: Orthopedic Surgery

## 2020-04-01 ENCOUNTER — Other Ambulatory Visit (HOSPITAL_COMMUNITY): Payer: 59

## 2020-04-01 ENCOUNTER — Inpatient Hospital Stay (HOSPITAL_COMMUNITY): Admission: RE | Admit: 2020-04-01 | Payer: 59 | Source: Ambulatory Visit

## 2020-04-03 ENCOUNTER — Encounter (HOSPITAL_COMMUNITY): Admission: RE | Payer: Self-pay | Source: Home / Self Care

## 2020-04-03 ENCOUNTER — Ambulatory Visit (HOSPITAL_COMMUNITY): Admission: RE | Admit: 2020-04-03 | Payer: 59 | Source: Home / Self Care | Admitting: Orthopedic Surgery

## 2020-04-03 SURGERY — ANTERIOR CERVICAL DECOMPRESSION/DISCECTOMY FUSION 2 LEVELS
Anesthesia: General

## 2020-04-14 ENCOUNTER — Encounter: Payer: Self-pay | Admitting: Physician Assistant

## 2020-04-15 ENCOUNTER — Encounter: Payer: Self-pay | Admitting: Physician Assistant

## 2020-04-15 ENCOUNTER — Other Ambulatory Visit: Payer: Self-pay

## 2020-04-15 ENCOUNTER — Ambulatory Visit (INDEPENDENT_AMBULATORY_CARE_PROVIDER_SITE_OTHER): Payer: 59 | Admitting: Physician Assistant

## 2020-04-15 VITALS — BP 109/52 | HR 97 | Temp 98.4°F | Ht 66.0 in | Wt 210.0 lb

## 2020-04-15 DIAGNOSIS — Z0271 Encounter for disability determination: Secondary | ICD-10-CM

## 2020-04-15 DIAGNOSIS — L732 Hidradenitis suppurativa: Secondary | ICD-10-CM

## 2020-04-15 DIAGNOSIS — L02411 Cutaneous abscess of right axilla: Secondary | ICD-10-CM | POA: Diagnosis not present

## 2020-04-15 MED ORDER — DOXYCYCLINE HYCLATE 100 MG PO TABS: 100.0000 mg | ORAL_TABLET | Freq: Two times a day (BID) | ORAL | 0 refills | Status: DC

## 2020-04-15 NOTE — Progress Notes (Deleted)
Right axillary abscess Started Friday Started draining this AM Painful

## 2020-04-15 NOTE — Patient Instructions (Addendum)
Skin Abscess  A skin abscess is an infected area on or under your skin that contains a collection of pus and other material. An abscess may also be called a furuncle, carbuncle, or boil. An abscess can occur in or on almost any part of your body. Some abscesses break open (rupture) on their own. Most continue to get worse unless they are treated. The infection can spread deeper into the body and eventually into your blood, which can make you feel ill. Treatment usually involves draining the abscess. What are the causes? An abscess occurs when germs, like bacteria, pass through your skin and cause an infection. This may be caused by:  A scrape or cut on your skin.  A puncture wound through your skin, including a needle injection or insect bite.  Blocked oil or sweat glands.  Blocked and infected hair follicles.  A cyst that forms beneath your skin (sebaceous cyst) and becomes infected. What increases the risk? This condition is more likely to develop in people who:  Have a weak body defense system (immune system).  Have diabetes.  Have dry and irritated skin.  Get frequent injections or use illegal IV drugs.  Have a foreign body in a wound, such as a splinter.  Have problems with their lymph system or veins. What are the signs or symptoms? Symptoms of this condition include:  A painful, firm bump under the skin.  A bump with pus at the top. This may break through the skin and drain. Other symptoms include:  Redness surrounding the abscess site.  Warmth.  Swelling of the lymph nodes (glands) near the abscess.  Tenderness.  A sore on the skin. How is this diagnosed? This condition may be diagnosed based on:  A physical exam.  Your medical history.  A sample of pus. This may be used to find out what is causing the infection.  Blood tests.  Imaging tests, such as an ultrasound, CT scan, or MRI. How is this treated? A small abscess that drains on its own may not  need treatment. Treatment for larger abscesses may include:  Moist heat or heat pack applied to the area several times a day.  A procedure to drain the abscess (incision and drainage).  Antibiotic medicines. For a severe abscess, you may first get antibiotics through an IV and then change to antibiotics by mouth. Follow these instructions at home: Medicines   Take over-the-counter and prescription medicines only as told by your health care provider.  If you were prescribed an antibiotic medicine, take it as told by your health care provider. Do not stop taking the antibiotic even if you start to feel better. Abscess care   If you have an abscess that has not drained, apply heat to the affected area. Use the heat source that your health care provider recommends, such as a moist heat pack or a heating pad. ? Place a towel between your skin and the heat source. ? Leave the heat on for 20-30 minutes. ? Remove the heat if your skin turns bright red. This is especially important if you are unable to feel pain, heat, or cold. You may have a greater risk of getting burned.  Follow instructions from your health care provider about how to take care of your abscess. Make sure you: ? Cover the abscess with a bandage (dressing). ? Change your dressing or gauze as told by your health care provider. ? Wash your hands with soap and water before you change the   dressing or gauze. If soap and water are not available, use hand sanitizer.  Check your abscess every day for signs of a worsening infection. Check for: ? More redness, swelling, or pain. ? More fluid or blood. ? Warmth. ? More pus or a bad smell. General instructions  To avoid spreading the infection: ? Do not share personal care items, towels, or hot tubs with others. ? Avoid making skin contact with other people.  Keep all follow-up visits as told by your health care provider. This is important. Contact a health care provider if you  have:  More redness, swelling, or pain around your abscess.  More fluid or blood coming from your abscess.  Warm skin around your abscess.  More pus or a bad smell coming from your abscess.  A fever.  Muscle aches.  Chills or a general ill feeling. Get help right away if you:  Have severe pain.  See red streaks on your skin spreading away from the abscess. Summary  A skin abscess is an infected area on or under your skin that contains a collection of pus and other material.  A small abscess that drains on its own may not need treatment.  Treatment for larger abscesses may include having a procedure to drain the abscess and taking an antibiotic. This information is not intended to replace advice given to you by your health care provider. Make sure you discuss any questions you have with your health care provider. Document Revised: 08/24/2018 Document Reviewed: 06/16/2017 Elsevier Patient Education  2020 [place].  Hidradenitis Suppurativa Hidradenitis suppurativa is a long-term (chronic) skin disease. It is similar to a severe form of acne, but it affects areas of the body where acne would be unusual, especially areas of the body where skin rubs against skin and becomes moist. These include:  Underarms.  Groin.  Genital area.  Buttocks.  Upper thighs.  Breasts. Hidradenitis suppurativa may start out as small lumps or pimples caused by blocked sweat glands or hair follicles. Pimples may develop into deep sores that break open (rupture) and drain pus. Over time, affected areas of skin may thicken and become scarred. This condition is rare and does not spread from person to person (non-contagious). What are the causes? The exact cause of this condition is not known. It may be related to:  Female and female hormones.  An overactive disease-fighting system (immune system). The immune system may over-react to blocked hair follicles or sweat glands and cause swelling and  pus-filled sores. What increases the risk? You are more likely to develop this condition if you:  Are female.  Are 36-29 years old.  Have a family history of hidradenitis suppurativa.  Have a personal history of acne.  Are overweight.  Smoke.  Take the medicine lithium. What are the signs or symptoms? The first symptoms are usually painful bumps in the skin, similar to pimples. The condition may get worse over time (progress), or it may only cause mild symptoms. If the disease progresses, symptoms may include:  Skin bumps getting bigger and growing deeper into the skin.  Bumps rupturing and draining pus.  Itchy, infected skin.  Skin getting thicker and scarred.  Tunnels under the skin (fistulas) where pus drains from a bump.  Pain during daily activities, such as pain during walking if your groin area is affected.  Emotional problems, such as stress or depression. This condition may affect your appearance and your ability or willingness to wear certain clothes or do certain  activities. How is this diagnosed? This condition is diagnosed by a health care provider who specializes in skin diseases (dermatologist). You may be diagnosed based on:  Your symptoms and medical history.  A physical exam.  Testing a pus sample for infection.  Blood tests. How is this treated? Your treatment will depend on how severe your symptoms are. The same treatment will not work for everybody with this condition. You may need to try several treatments to find what works best for you. Treatment may include:  Cleaning and bandaging (dressing) your wounds as needed.  Lifestyle changes, such as new skin care routines.  Taking medicines, such as: ? Antibiotics. ? Acne medicines. ? Medicines to reduce the activity of the immune system. ? A diabetes medicine (metformin). ? Birth control pills, for women. ? Steroids to reduce swelling and pain.  Working with a mental health care provider,  if you experience emotional distress due to this condition. If you have severe symptoms that do not get better with medicine, you may need surgery. Surgery may involve:  Using a laser to clear the skin and remove hair follicles.  Opening and draining deep sores.  Removing the areas of skin that are diseased and scarred. Follow these instructions at home: Medicines   Take over-the-counter and prescription medicines only as told by your health care provider.  If you were prescribed an antibiotic medicine, take it as told by your health care provider. Do not stop taking the antibiotic even if your condition improves. Skin care  If you have open wounds, cover them with a clean dressing as told by your health care provider. Keep wounds clean by washing them gently with soap and water when you bathe.  Do not shave the areas where you get hidradenitis suppurativa.  Do not wear deodorant.  Wear loose-fitting clothes.  Try to avoid getting overheated or sweaty. If you get sweaty or wet, change into clean, dry clothes as soon as you can.  To help relieve pain and itchiness, cover sore areas with a warm, clean washcloth (warm compress) for 5-10 minutes as often as needed.  If told by your health care provider, take a bleach bath twice a week: ? Fill your bathtub halfway with water. ? Pour in  cup of unscented household bleach. ? Soak in the tub for 5-10 minutes. ? Only soak from the neck down. Avoid water on your face and hair. ? Shower to rinse off the bleach from your skin. General instructions  Learn as much as you can about your disease so that you have an active role in your treatment. Work closely with your health care provider to find treatments that work for you.  If you are overweight, work with your health care provider to lose weight as recommended.  Do not use any products that contain nicotine or tobacco, such as cigarettes and e-cigarettes. If you need help quitting, ask  your health care provider.  If you struggle with living with this condition, talk with your health care provider or work with a mental health care provider as recommended.  Keep all follow-up visits as told by your health care provider. This is important. Where to find more information  Hidradenitis [place].: [email or web address]://www.hs-foundation.org/ Contact a health care provider if you have:  A flare-up of hidradenitis suppurativa.  A fever or chills.  Trouble controlling your symptoms at home.  Trouble doing your daily activities because of your symptoms.  Trouble dealing with emotional problems related to your  condition. Summary  Hidradenitis suppurativa is a long-term (chronic) skin disease. It is similar to a severe form of acne, but it affects areas of the body where acne would be unusual.  The first symptoms are usually painful bumps in the skin, similar to pimples. The condition may get worse over time (progress), or it may only cause mild symptoms.  If you have open wounds, cover them with a clean dressing as told by your health care provider. Keep wounds clean by washing them gently with soap and water when you bathe.  Besides skin care, treatment may include medicines, laser treatment, and surgery. This information is not intended to replace advice given to you by your health care provider. Make sure you discuss any questions you have with your health care provider. Document Revised: 05/11/2017 Document Reviewed: 05/11/2017 Elsevier Patient Education  2020 [employer].

## 2020-04-15 NOTE — Progress Notes (Addendum)
Acute Office Visit  Subjective:    Patient ID: Nina Trujillo, female    DOB: 1965-08-31, 54 y.o.   MRN: [id number]  Chief Complaint  Patient presents with  . Abscess    right axilla    HPI Patient is in today for right axillary abscess since Friday 04/11/2020. Pt has hx of hidradenitis suppurativa with abscesses. Pt continues to smoke.   Drainage began this morning. Patient states she's had abscesses in this spot as well as other spots before. Patient doesn't recall last time she had infection in this spot and denies recent antibiotic use. Patient denies fever, chills. She has had some nausea due to pain on Saturday. She has been using warm salt water soaks and bayer ASA.   Past Medical History:  Diagnosis Date  . Chronic kidney disease    stage 3   . Depression   . Diabetes mellitus without complication ([place])   . Hypertension     Past Surgical History:  Procedure Laterality Date  . ABDOMINAL HYSTERECTOMY     both uterus and ovaries removed.   . COLONOSCOPY  10 + years ago    IN [facility]-normal exam per pt   Outpatient Medications Prior to Visit  Medication Sig Dispense Refill  . AMBULATORY NON FORMULARY MEDICATION Glucometer device and lancets and test strips.  Dx. Diabetes 250.0 controlled   Test 1 to 3 times a day. 1 Device 0  . atorvastatin (LIPITOR) 20 MG tablet Take 1 tablet (20 mg total) by mouth daily. 90 tablet 3  . Biotin w/ Vitamins C & E (HAIR/SKIN/NAILS PO) Take 1 capsule by mouth at bedtime.    [name] buPROPion (WELLBUTRIN SR) 150 MG 12 hr tablet Take 1 tablet (150 mg total) by mouth 2 (two) times daily. 180 tablet 1  . clotrimazole (LOTRIMIN) 1 % cream Apply 1 application topically 2 (two) times daily. (Patient taking differently: Apply 1 application topically 2 (two) times daily as needed (yeast/skin irritation.). ) 60 g 0  . Continuous Blood Gluc Receiver (DEXCOM G6 RECEIVER) DEVI 1 Device by Does not apply route 4 (four) times daily as needed. 1 each 0    . Continuous Blood Gluc Sensor (DEXCOM G6 SENSOR) MISC Use to check blood sugars 1 each 11  . cyclobenzaprine (FLEXERIL) 10 MG tablet TAKE 1 TABLET BY MOUTH AT BEDTIME. (Patient taking differently: Take 10 mg by mouth daily as needed (spasms.). ) 30 tablet 0  . diclofenac sodium (VOLTAREN) 1 % GEL Apply 4 g topically 4 (four) times daily. To affected joint. (Patient taking differently: Apply 2-4 g topically 4 (four) times daily as needed (joint pain). ) 100 g 1  . escitalopram (LEXAPRO) 5 MG tablet Take 1 tablet (5 mg total) by mouth daily. 90 tablet 0  . estradiol (ESTRACE) 2 MG tablet Take 1 tablet (2 mg total) by mouth daily. 90 tablet 1  . gabapentin (NEURONTIN) 300 MG capsule One tab PO qPM abd one po qHS (Patient taking differently: Take 300 mg by mouth in the morning and at bedtime. ) 90 capsule 5  . hydrochlorothiazide (HYDRODIURIL) 25 MG tablet Take 1 tablet (25 mg total) by mouth at bedtime. TAKE 1 TABLET BY MOUTH DAILY. 90 tablet 1  . insulin degludec (TRESIBA FLEXTOUCH) 100 UNIT/ML FlexTouch Pen INJECT 13 UNITS SUBCUTANEOUSLY AT BEDTIME 3 mL 3  . Insulin Pen Needle (PEN NEEDLES) 31G X 6 MM MISC Injected insulin into skin once daily. 100 each prn  . meloxicam (MOBIC) 15 MG tablet  One tab PO qAM with a meal for 2 weeks, then daily prn pain. (Patient taking differently: Take 15 mg by mouth daily. ) 30 tablet 3  . metFORMIN (GLUCOPHAGE) 1000 MG tablet Take 1 tablet (1,000 mg total) by mouth 2 (two) times daily with a meal. 180 tablet 3  . OZEMPIC, 1 MG/DOSE, 4 MG/3ML SOPN Inject 1 mg into the skin every Friday.     . phentermine 15 MG capsule Take 1 capsule (15 mg total) by mouth every morning. 90 capsule 0  . topiramate (TOPAMAX) 25 MG tablet Take 1 tablet (25 mg total) by mouth 2 (two) times daily. 180 tablet 0  . triamcinolone cream (KENALOG) 0.1 % Apply 1 application topically 2 (two) times daily. (Patient taking differently: Apply 1 application topically 2 (two) times daily as needed  (skin irritation.). ) 80 g 0   No facility-administered medications prior to visit.    Allergies  Allergen Reactions  . Penicillins Anaphylaxis  . Xigduo Xr [Dapagliflozin-Metformin Hcl Er] Other (See Comments)    Yeast infection.     Review of Systems  Constitutional: Negative for fever.  Skin: Positive for color change, rash and wound.      Objective:    Physical Exam Chest:     Chest wall: Swelling, tenderness and crepitus present.    Skin:    Comments: Right axilla 1cm by 1cm open and draining abscess with surrounding 3cm by 3cm indurated, warm, painful area.      BP (!) 109/52   Pulse 97   Temp 98.4 F (36.9 C) (Oral)   Ht 5\' 6"  (1.676 m)   Wt 210 lb (95.3 kg)   SpO2 100%   BMI 33.89 kg/m  Wt Readings from Last 3 Encounters:  04/15/20 210 lb (95.3 kg)  02/18/20 214 lb (97.1 kg)  12/13/19 (!) 215 lb (97.5 kg)         Assessment & Plan:   Large abscess in right axilla. Plan to I&D in office to assist in drainage of pus. Plan to start 10 days of doxycycline PO. Continue warm compresses. Ibuprofen 800mg  for pain up to three times a day. Tramadol as needed for break through pain. She has some at home.  Also discussed smoking as a risk factor for inflammatory conditions such as hydradenitis suppurativa. Discussed possible necessity of referral to general surgery if patient fails to improve or has recurrence. Follow up recheck on Friday.   Incision and Drainage Procedure Note  Pre-operative Diagnosis: L02.411 Right axilla abscess  Post-operative Diagnosis: same  Indications: Tender area with fluctuation, erythema, some drainage.  Anesthesia: 1% plain lidocaine  Procedure Details  The procedure, risks and complications have been discussed in detail (including, but not limited to infection, bleeding) with the patient.  The skin was prepped and draped over the affected area in the usual fashion. After adequate local anesthesia, I&D with a #11 blade was  performed on the right axilla. Approximately 1cm incision was made adjacent to already open area. Purulent drainage: absent. Bloody discharge present. Wound packing was placed. Compression bandage with topical Bactroban over incision.  The patient was observed until stable.  Findings: Minimal purulent drainage. Evidence of multiple tracts causing abscess.   EBL: minimal  Drains: None  Condition: Tolerated procedure well and sent home with PO doxycycline   Complications: none.    Marland KitchenVernetta Honey PA-C, have reviewed and agree with the above documentation in it's entirety.

## 2020-04-18 ENCOUNTER — Other Ambulatory Visit: Payer: Self-pay

## 2020-04-18 ENCOUNTER — Ambulatory Visit (INDEPENDENT_AMBULATORY_CARE_PROVIDER_SITE_OTHER): Payer: 59 | Admitting: Physician Assistant

## 2020-04-18 ENCOUNTER — Encounter: Payer: Self-pay | Admitting: Physician Assistant

## 2020-04-18 VITALS — BP 129/70 | HR 90 | Ht 66.0 in | Wt 210.0 lb

## 2020-04-18 DIAGNOSIS — L02411 Cutaneous abscess of right axilla: Secondary | ICD-10-CM | POA: Insufficient documentation

## 2020-04-18 DIAGNOSIS — L732 Hidradenitis suppurativa: Secondary | ICD-10-CM

## 2020-04-18 NOTE — Progress Notes (Addendum)
Established Patient Office Visit  Subjective:  Patient ID: Nina Trujillo, female    DOB: 03-Sep-1965  Age: 54 y.o. MRN: [id number]  CC:  Chief Complaint  Patient presents with  . Abscess    HPI Nina Trujillo presents for 3 day f/u on right axillary abscess. I&D was performed in office on 04/15/2020 and doxycycline was started. Patient tolerated procedure well.    Today patient is having much less pain and feeling much better. There is still some purulent drainage according to patient. Patient is still taking doxycycline as prescribed.   Past Medical History:  Diagnosis Date  . Chronic kidney disease    stage 3   . Depression   . Diabetes mellitus without complication ([place])   . Hypertension     Past Surgical History:  Procedure Laterality Date  . ABDOMINAL HYSTERECTOMY     both uterus and ovaries removed.   . COLONOSCOPY  10 + years ago    IN [facility]-normal exam per pt   Outpatient Medications Prior to Visit  Medication Sig Dispense Refill  . AMBULATORY NON FORMULARY MEDICATION Glucometer device and lancets and test strips.  Dx. Diabetes 250.0 controlled   Test 1 to 3 times a day. 1 Device 0  . atorvastatin (LIPITOR) 20 MG tablet Take 1 tablet (20 mg total) by mouth daily. 90 tablet 3  . Biotin w/ Vitamins C & E (HAIR/SKIN/NAILS PO) Take 1 capsule by mouth at bedtime.    [name] buPROPion (WELLBUTRIN SR) 150 MG 12 hr tablet Take 1 tablet (150 mg total) by mouth 2 (two) times daily. 180 tablet 1  . clotrimazole (LOTRIMIN) 1 % cream Apply 1 application topically 2 (two) times daily. (Patient taking differently: Apply 1 application topically 2 (two) times daily as needed (yeast/skin irritation.). ) 60 g 0  . Continuous Blood Gluc Receiver (DEXCOM G6 RECEIVER) DEVI 1 Device by Does not apply route 4 (four) times daily as needed. 1 each 0  . Continuous Blood Gluc Sensor (DEXCOM G6 SENSOR) MISC Use to check blood sugars 1 each 11  . cyclobenzaprine (FLEXERIL) 10 MG  tablet TAKE 1 TABLET BY MOUTH AT BEDTIME. (Patient taking differently: Take 10 mg by mouth daily as needed (spasms.). ) 30 tablet 0  . diclofenac sodium (VOLTAREN) 1 % GEL Apply 4 g topically 4 (four) times daily. To affected joint. (Patient taking differently: Apply 2-4 g topically 4 (four) times daily as needed (joint pain). ) 100 g 1  . doxycycline (VIBRA-TABS) 100 MG tablet Take 1 tablet (100 mg total) by mouth 2 (two) times daily. 20 tablet 0  . escitalopram (LEXAPRO) 5 MG tablet Take 1 tablet (5 mg total) by mouth daily. 90 tablet 0  . estradiol (ESTRACE) 2 MG tablet Take 1 tablet (2 mg total) by mouth daily. 90 tablet 1  . gabapentin (NEURONTIN) 300 MG capsule One tab PO qPM abd one po qHS (Patient taking differently: Take 300 mg by mouth in the morning and at bedtime. ) 90 capsule 5  . hydrochlorothiazide (HYDRODIURIL) 25 MG tablet Take 1 tablet (25 mg total) by mouth at bedtime. TAKE 1 TABLET BY MOUTH DAILY. 90 tablet 1  . insulin degludec (TRESIBA FLEXTOUCH) 100 UNIT/ML FlexTouch Pen INJECT 13 UNITS SUBCUTANEOUSLY AT BEDTIME 3 mL 3  . Insulin Pen Needle (PEN NEEDLES) 31G X 6 MM MISC Injected insulin into skin once daily. 100 each prn  . meloxicam (MOBIC) 15 MG tablet One tab PO qAM with a meal for 2  weeks, then daily prn pain. (Patient taking differently: Take 15 mg by mouth daily. ) 30 tablet 3  . metFORMIN (GLUCOPHAGE) 1000 MG tablet Take 1 tablet (1,000 mg total) by mouth 2 (two) times daily with a meal. 180 tablet 3  . OZEMPIC, 1 MG/DOSE, 4 MG/3ML SOPN Inject 1 mg into the skin every Friday.     . phentermine 15 MG capsule Take 1 capsule (15 mg total) by mouth every morning. 90 capsule 0  . topiramate (TOPAMAX) 25 MG tablet Take 1 tablet (25 mg total) by mouth 2 (two) times daily. 180 tablet 0  . triamcinolone cream (KENALOG) 0.1 % Apply 1 application topically 2 (two) times daily. (Patient taking differently: Apply 1 application topically 2 (two) times daily as needed (skin irritation.).  ) 80 g 0   No facility-administered medications prior to visit.    Allergies  Allergen Reactions  . Penicillins Anaphylaxis  . Xigduo Xr [Dapagliflozin-Metformin Hcl Er] Other (See Comments)    Yeast infection.     ROS Review of Systems See HPI.    Objective:    Physical Exam Chest:       Comments: Large abscess of right axilla greatly improved. One small indurated area. Incised area healing as expected with granulation tissue and no signs of incisional infection.    BP 129/70   Pulse 90   Ht 5\' 6"  (1.676 m)   Wt 210 lb (95.3 kg)   SpO2 97%   BMI 33.89 kg/m  Wt Readings from Last 3 Encounters:  04/18/20 210 lb (95.3 kg)  04/15/20 210 lb (95.3 kg)  02/18/20 214 lb (97.1 kg)     Assessment & Plan:   3 days s/p abscess I&D - Packing with purulent drainage removed in it's entirety in one continuous piece. Wound flushed with 96mL sterile saline. Antibiotic ointment applied and wound dressed with gauze.  Recurrent Abscess - Patient has had multiple infections at the same spot in the right axilla. Concerned for draining tracts that can't fully clear infection leading to cyclical abscesses. Plan to refer to general surgery.  Essential Hypertension, benign  - Follow up as instructed in January 2022 - Continue taking medications as prescribed and eating a balanced & nutritious diet  T2 DM w/ CKD, without long-term current use of insulin, unspecified CKD stage - Follow up as instructed in January 2022 - Continue taking medications as prescribed and eating a balanced & nutritious diet  Nicotine Dependence - Educated patient on systemic inflammation secondary to nicotine use. Certainly smoking is not helping recurrent abscesses. Encouraged nicotine cessation. Patient verbalized understanding of conversation but was not amenable to cessation planning at this time. She is on wellbutrin. Does not want to increase the dose.     Follow-up: Return if symptoms worsen or fail  to improve.    Vernetta Honey PA-C, have reviewed and agree with the above documentation in it's entirety.

## 2020-04-24 ENCOUNTER — Other Ambulatory Visit: Payer: Self-pay | Admitting: Physician Assistant

## 2020-04-24 DIAGNOSIS — E119 Type 2 diabetes mellitus without complications: Secondary | ICD-10-CM

## 2020-04-30 ENCOUNTER — Ambulatory Visit: Payer: 59 | Admitting: Neurology

## 2020-04-30 ENCOUNTER — Encounter: Payer: Self-pay | Admitting: Neurology

## 2020-04-30 VITALS — BP 129/72 | HR 93 | Ht 66.0 in | Wt 209.5 lb

## 2020-04-30 DIAGNOSIS — R9082 White matter disease, unspecified: Secondary | ICD-10-CM | POA: Diagnosis not present

## 2020-04-30 DIAGNOSIS — M5030 Other cervical disc degeneration, unspecified cervical region: Secondary | ICD-10-CM | POA: Diagnosis not present

## 2020-04-30 DIAGNOSIS — R937 Abnormal findings on diagnostic imaging of other parts of musculoskeletal system: Secondary | ICD-10-CM

## 2020-04-30 DIAGNOSIS — G5603 Carpal tunnel syndrome, bilateral upper limbs: Secondary | ICD-10-CM

## 2020-04-30 NOTE — Progress Notes (Signed)
[facility]  PATIENT: Nina Trujillo DOB: 1965/12/17  REFERRING DOCTOR OR PCP: Aundria Mems SOURCE: Patient, notes from primary care, imaging and laboratory reports, MRI images personally reviewed.  _________________________________   HISTORICAL  CHIEF COMPLAINT:  Chief Complaint  Patient presents with  . Follow-up    RM 13, alone. Last seen 01/15/20 for EMG/NCS and 12/13/19 for OV. Pt reports new sx: pins/needles/pain in feet. This is intermittent throughout the day. Has noticed intermittent thigh numbness that started a couple weeks ago. Denies any injuries.     HISTORY OF PRESENT ILLNESS:  Nina Trujillo, at the [place] at [facility] for neurologic consultation regarding her abnormal brain and cervical spine MRI and concern about the possibility of multiple sclerosis.   UPDATE 04/30/2020: She is reporting shooting electric sensations in her feet.   This happens mostly after standing up when laying or siting a long time.   We discussed that those findings could be due to the degenerative changes of the neck or to the focus in the spinal cord.   Since the last visit, she had a lumbar puncture that showed normal spinal fluid (no oligoclonal bands)  She is noting a lot of numbness in her hands.  The splints have not helped.   NCV/EMG had shown bilateral moderate CTS and chronic C5 and C6 radiculopathy..   She is on gabapentin but it makes her sleepy so she takes it only at bedtime.   She is still wearing wrist splints.   Dr. Lynann Bologna will be doing spinal fusion soon (was to be in October but called off dur to Covid-19.    NCV/EMG   01/15/2020 1.   Moderate bilateral median neuropathies across the wrist, right worse than left. 2.   Mild right chronic C5 or C6 radiculopathy and possible left chronic C5 or C6 radiculopathy.   History of numbness and abnormal brain MRI: She had numbness in her left arm into her hand and fingers in  2020.  She has had some pain of the left arm off and on for a while but she started to experience numbness about 1 month ago that gradually worsened.   She also has achiness in her neck and upper back.  She has had neck pain off and on for several years but it is worse this year.  MRI of the cervical spine showed a single focus at C5-C6 just below the spinal stenosis at C5-C6.  It enhanced and could have been consistent with demyelination.  Therefore, an MRI of the brain was checked.  It showed multiple T2/FLAIR hyperintense foci that were more consistent with chronic microvascular ischemic changes and to demyelination.  CSF was evaluated on 12/20/2019 and she did not have oligoclonal bands or elevated IgG index.  Vasculitis labs were normal.   Vascular risk factors:   Type 2 IDDM, HTN, 1/4 ppd smoker now (1/2 to 1 ppd x many years though).      We discussed smoking cessation.     Father, sister, half-sister and paternal uncle and a cousin have MS.     DATA: Imaging: The MRI of the brain from 12/03/2019 was personally reviewed and it shows multiple T2/FLAIR hyperintense foci located in the hemispheres and pons.  The foci in the pons are confluent in the medial pons.  The foci in the hemispheres are predominantly in the subcortical and deep white matter.  She does not have septal callosal foci.  None of them appear to be acute  and they do not enhance.  The MRI of the cervical spine dated 11/25/2019 was personally reviewed.  It shows mild spinal stenosis at C3-C4 and moderate spinal stenosis at C4- C5 and C5-C6 due to disc protrusion and other degenerative change.  There is a T2 hyperintense focus in the left posterior lateral spinal cord just below the C5-C6 interspace.  She return for a contrasted MRI of the cervical spine 12/03/2019.  It showed that the abnormal focus in the spinal cord enhanced after contrast.  Labs: CSF 8/5 /21 showed identical CSF/serum bands but no bands in CSF not present in serum.    IgG index was normal  Results were not c/w MS.    ANA, ESR, ANCA, neuromyelitis optica were normal or negative 01/15/2020  NCV/EMG   01/15/2020 1.   Moderate bilateral median neuropathies across the wrist, right worse than left. 2.   Mild right chronic C5 or C6 radiculopathy and possible left chronic C5 or C6 radiculopathy.    REVIEW OF SYSTEMS: Constitutional: No fevers, chills, sweats, or change in appetite.  She notes fatigue. Eyes: No visual changes, double vision, eye pain Ear, nose and throat: No hearing loss, ear pain, nasal congestion, sore throat Cardiovascular: No chest pain, palpitations Respiratory: No shortness of breath at rest or with exertion.   No wheezes GastrointestinaI: No nausea, vomiting, diarrhea, abdominal pain, fecal incontinence Genitourinary:Mild urinary urgency without incontinence.   Musculoskeletal:As above  integumentary: No rash, pruritus, skin lesions Neurological: as above Psychiatric: No depression at this time.  No anxiety Endocrine: No palpitations, diaphoresis, change in appetite, change in weigh or increased thirst Hematologic/Lymphatic: No anemia, purpura, petechiae. Allergic/Immunologic: No itchy/runny eyes, nasal congestion, recent allergic reactions, rashes  ALLERGIES: Allergies  Allergen Reactions  . Penicillins Anaphylaxis  . Xigduo Xr [Dapagliflozin-Metformin Hcl Er] Other (See Comments)    Yeast infection.     HOME MEDICATIONS:  Current Outpatient Medications:  .  AMBULATORY NON FORMULARY MEDICATION, Glucometer device and lancets and test strips.  Dx. Diabetes 250.0 controlled   Test 1 to 3 times a day., Disp: 1 Device, Rfl: 0 .  atorvastatin (LIPITOR) 20 MG tablet, Take 1 tablet (20 mg total) by mouth daily., Disp: 90 tablet, Rfl: 3 .  Biotin w/ Vitamins C & E (HAIR/SKIN/NAILS PO), Take 1 capsule by mouth at bedtime., Disp: , Rfl:  .  buPROPion (WELLBUTRIN SR) 150 MG 12 hr tablet, Take 1 tablet (150 mg total) by mouth 2 (two)  times daily., Disp: 180 tablet, Rfl: 1 .  clotrimazole (LOTRIMIN) 1 % cream, Apply 1 application topically 2 (two) times daily. (Patient taking differently: Apply 1 application topically 2 (two) times daily as needed (yeast/skin irritation.).), Disp: 60 g, Rfl: 0 .  Continuous Blood Gluc Receiver (DEXCOM G6 RECEIVER) DEVI, 1 Device by Does not apply route 4 (four) times daily as needed., Disp: 1 each, Rfl: 0 .  Continuous Blood Gluc Sensor (DEXCOM G6 SENSOR) MISC, Use to check blood sugars, Disp: 1 each, Rfl: 11 .  cyclobenzaprine (FLEXERIL) 10 MG tablet, TAKE 1 TABLET BY MOUTH AT BEDTIME. (Patient taking differently: Take 10 mg by mouth daily as needed (spasms.).), Disp: 30 tablet, Rfl: 0 .  diclofenac sodium (VOLTAREN) 1 % GEL, Apply 4 g topically 4 (four) times daily. To affected joint. (Patient taking differently: Apply 2-4 g topically 4 (four) times daily as needed (joint pain).), Disp: 100 g, Rfl: 1 .  escitalopram (LEXAPRO) 5 MG tablet, Take 1 tablet (5 mg total) by mouth daily., Disp:  90 tablet, Rfl: 0 .  estradiol (ESTRACE) 2 MG tablet, Take 1 tablet (2 mg total) by mouth daily., Disp: 90 tablet, Rfl: 1 .  gabapentin (NEURONTIN) 300 MG capsule, One tab PO qPM abd one po qHS (Patient taking differently: Take 300 mg by mouth in the morning and at bedtime.), Disp: 90 capsule, Rfl: 5 .  hydrochlorothiazide (HYDRODIURIL) 25 MG tablet, Take 1 tablet (25 mg total) by mouth at bedtime. TAKE 1 TABLET BY MOUTH DAILY., Disp: 90 tablet, Rfl: 1 .  Insulin Pen Needle (PEN NEEDLES) 31G X 6 MM MISC, Injected insulin into skin once daily., Disp: 100 each, Rfl: prn .  meloxicam (MOBIC) 15 MG tablet, One tab PO qAM with a meal for 2 weeks, then daily prn pain. (Patient taking differently: Take 15 mg by mouth daily.), Disp: 30 tablet, Rfl: 3 .  metFORMIN (GLUCOPHAGE) 1000 MG tablet, Take 1 tablet (1,000 mg total) by mouth 2 (two) times daily with a meal., Disp: 180 tablet, Rfl: 3 .  OZEMPIC, 1 MG/DOSE, 4 MG/3ML  SOPN, Inject 1 mg into the skin every Friday. , Disp: , Rfl:  .  phentermine 15 MG capsule, Take 1 capsule (15 mg total) by mouth every morning., Disp: 90 capsule, Rfl: 0 .  topiramate (TOPAMAX) 25 MG tablet, Take 1 tablet (25 mg total) by mouth 2 (two) times daily., Disp: 180 tablet, Rfl: 0 .  TRESIBA FLEXTOUCH 100 UNIT/ML FlexTouch Pen, INJECT 13 UNITS SUBCUTANEOUSLY AT BEDTIME, Disp: 9 mL, Rfl: 0 .  triamcinolone cream (KENALOG) 0.1 %, Apply 1 application topically 2 (two) times daily. (Patient taking differently: Apply 1 application topically 2 (two) times daily as needed (skin irritation.).), Disp: 80 g, Rfl: 0  PAST MEDICAL HISTORY: Past Medical History:  Diagnosis Date  . Chronic kidney disease    stage 3   . Depression   . Diabetes mellitus without complication ([place])   . Hypertension     PAST SURGICAL HISTORY: Past Surgical History:  Procedure Laterality Date  . ABDOMINAL HYSTERECTOMY     both uterus and ovaries removed.   . COLONOSCOPY  10 + years ago    IN [facility]-normal exam per pt    FAMILY HISTORY: Family History  Problem Relation Age of Onset  . Cancer Mother        cervical, breast  . Multiple sclerosis Mother   . Multiple sclerosis Father   . Heart attack Brother   . Cancer Maternal Aunt        lung  . Diabetes Maternal Aunt   . Hypertension Maternal Aunt   . Stroke Maternal Grandmother   . Multiple sclerosis Sister   . Diabetes Sister   . Diabetes Maternal Aunt   . Hypertension Maternal Aunt   . Multiple sclerosis Paternal Uncle   . Multiple sclerosis Half-Sister   . Colon cancer Neg Hx   . Esophageal cancer Neg Hx   . Rectal cancer Neg Hx   . Stomach cancer Neg Hx   . Colon polyps Neg Hx     SOCIAL HISTORY:  Social History   Socioeconomic History  . Marital status: Single    Spouse name: Not on file  . Number of children: 2  . Years of education: Not on file  . Highest education level: Not on file  Occupational History  . Not on file   Tobacco Use  . Smoking status: Current Every Day Smoker    Packs/day: 0.50    Types: Cigarettes  . Smokeless tobacco:  Never Used  Vaping Use  . Vaping Use: Never used  Substance and Sexual Activity  . Alcohol use: No    Alcohol/week: 0.0 standard drinks  . Drug use: No  . Sexual activity: Yes  Other Topics Concern  . Not on file  Social History Narrative   Lives with daughter    Caffeine use: coffee daily   Right handed    Social Determinants of Health   Financial Resource Strain: Not on file  Food Insecurity: Not on file  Transportation Needs: Not on file  Physical Activity: Not on file  Stress: Not on file  Social Connections: Not on file  Intimate Partner Violence: Not on file     PHYSICAL EXAM  Vitals:   04/30/20 1104  BP: 129/72  Pulse: 93  Weight: 209 lb 8 oz (95 kg)  Height: $Remove'5\' 6"'PftiMxG$  (1.676 m)    Body mass index is 33.81 kg/m.   General: The patient is well-developed and well-nourished and in no acute distress  HEENT:  Head is [place]/AT.  Sclera are anicteric.     Neck: The neck is tender mid to lower cervical paraspinal muscles and ROM is mildly reduced   Skin: Extremities are without rash or  edema.   Neurologic Exam  Mental status: The patient is alert and oriented x 3 at the time of the examination. The patient has apparent normal recent and remote memory, with an apparently normal attention span and concentration ability.   Speech is normal.  Cranial nerves: Extraocular movements are full. Facial strnegth is normal.   Motor:  Muscle bulk is normal.   Tone is normal. Strength is  5 / 5 in all 4 extremities except 4++ right triceps and 4++ left triceps.   Hands normal strength.   Sensory: She has normal sensation to touch in hands and leg.s    Coordination: Cerebellar testing reveals good finger-nose-finger and heel-to-shin bilaterally.  Gait and station: Station is normal.   Gait is arthritic and tandem gait is wide.. Romberg is negative.    Reflexes: Deep tendon reflexes are symmetric and normal bilaterally.           ASSESSMENT AND PLAN  DDD (degenerative disc disease), cervical  White matter abnormality on MRI of brain  Carpal tunnel syndrome, bilateral  Abnormal MRI, cervical spine   1.  Multiple sclerosis is less likely as the changes in the brain MRI are more consistent with chronic microvascular ischemic change and her lumbar puncture showed normal spinal fluid.  If she has any symptoms consider repeat MRI to make sure that there is no progression. 2.   Although unusual, an enhancing focus can be seen with compressive myelopathy.  She will be undergoing cervical fusion soon. 3.   Return in 1 year or sooner for new or worsening neurologic symptoms.  Kayliana Codd A. Felecia Shelling, MD, [facility] 63/14/9702, 6:37 PM Certified in Neurology, Clinical Neurophysiology, Sleep Medicine and Neuroimaging  [facility] [address] [place], [place] [address] [phone]

## 2020-05-08 ENCOUNTER — Other Ambulatory Visit: Payer: Self-pay | Admitting: Physician Assistant

## 2020-05-08 ENCOUNTER — Encounter: Payer: Self-pay | Admitting: Physician Assistant

## 2020-05-08 ENCOUNTER — Other Ambulatory Visit: Payer: Self-pay | Admitting: Sports Medicine

## 2020-05-08 DIAGNOSIS — M5412 Radiculopathy, cervical region: Secondary | ICD-10-CM

## 2020-05-08 DIAGNOSIS — M5030 Other cervical disc degeneration, unspecified cervical region: Secondary | ICD-10-CM

## 2020-05-20 ENCOUNTER — Ambulatory Visit: Payer: 59 | Admitting: Physician Assistant

## 2020-05-23 ENCOUNTER — Ambulatory Visit: Payer: 59 | Admitting: Physician Assistant

## 2020-05-23 ENCOUNTER — Telehealth: Payer: Self-pay | Admitting: Physician Assistant

## 2020-05-23 NOTE — Telephone Encounter (Signed)
Pt called at 925 to cancel appt.

## 2020-05-27 ENCOUNTER — Ambulatory Visit: Payer: 59 | Admitting: Physician Assistant

## 2020-05-28 ENCOUNTER — Other Ambulatory Visit: Payer: Self-pay | Admitting: Physician Assistant

## 2020-05-28 DIAGNOSIS — F330 Major depressive disorder, recurrent, mild: Secondary | ICD-10-CM

## 2020-06-06 ENCOUNTER — Ambulatory Visit: Payer: 59 | Admitting: Physician Assistant

## 2020-06-10 ENCOUNTER — Other Ambulatory Visit: Payer: Self-pay

## 2020-06-10 ENCOUNTER — Ambulatory Visit (INDEPENDENT_AMBULATORY_CARE_PROVIDER_SITE_OTHER): Payer: 59 | Admitting: Physician Assistant

## 2020-06-10 VITALS — BP 125/59 | HR 98 | Ht 66.0 in | Wt 208.0 lb

## 2020-06-10 DIAGNOSIS — Z794 Long term (current) use of insulin: Secondary | ICD-10-CM

## 2020-06-10 DIAGNOSIS — E1169 Type 2 diabetes mellitus with other specified complication: Secondary | ICD-10-CM

## 2020-06-10 DIAGNOSIS — L02411 Cutaneous abscess of right axilla: Secondary | ICD-10-CM

## 2020-06-10 DIAGNOSIS — L732 Hidradenitis suppurativa: Secondary | ICD-10-CM | POA: Diagnosis not present

## 2020-06-10 LAB — POCT GLYCOSYLATED HEMOGLOBIN (HGB A1C): Hemoglobin A1C: 5.6 % (ref 4.0–5.6)

## 2020-06-10 MED ORDER — OZEMPIC (1 MG/DOSE) 4 MG/3ML ~~LOC~~ SOPN: PEN_INJECTOR | SUBCUTANEOUS | 2 refills | Status: DC

## 2020-06-10 MED ORDER — DOXYCYCLINE HYCLATE 100 MG PO TABS: 100.0000 mg | ORAL_TABLET | Freq: Two times a day (BID) | ORAL | 0 refills | Status: DC

## 2020-06-10 NOTE — Progress Notes (Signed)
Subjective:    Patient ID: Nina Trujillo, female    DOB: 30-Jul-1965, 55 y.o.   MRN: [id number]  HPI  Patient is a 55 year old female with hypertension, type 2 diabetes, hidranentitis suppurativa who presents to the clinic for follow-up.  She is monitoring her sugars fairly well.  Most mornings she is in the 110s.  She denies any hypoglycemic events.  She is on Tresiba 13 units at night along with her Metformin and Ozempic.  She does have an ongoing abscess in her right axilla.  She has needed antibiotics for this in the past.  She does need dermatologic procedure to remove her right now is flaring.  It is very painful.  Patient is also in need for a anterior cervical spinal decompression procedure.  She is seeing Dr. Lynann Bologna.  Her A1c needs to be controlled to have surgery. tresbia 13 units.   Anterior cervical decompression  .[name] Active Ambulatory Problems    Diagnosis Date Noted  . Tobacco dependence 05/31/2014  . Abnormal weight gain 05/31/2014  . Obese 05/31/2014  . Essential hypertension, benign 05/31/2014  . Diabetes mellitus without complication ([place]) 88/41/6606  . Menopausal symptoms 05/31/2014  . Hidradenitis suppurativa 12/13/2014  . CKD (chronic kidney disease) stage 3, GFR 30-59 ml/min (HCC) 12/16/2014  . Class 1 obesity due to excess calories with serious comorbidity and body mass index (BMI) of 34.0 to 34.9 in adult 03/21/2015  . Depression 03/21/2015  . Microalbuminuria due to type 2 diabetes mellitus ([place]) 08/25/2015  . Allergic rhinitis due to allergen 08/08/2017  . Upper back pain 05/19/2018  . Benign paroxysmal positional vertigo due to bilateral vestibular disorder 05/19/2018  . Mixed hyperlipidemia 05/22/2018  . Dyslipidemia, goal LDL below 70 05/22/2018  . DDD (degenerative disc disease), cervical 04/04/2019  . Type 2 diabetes mellitus with chronic kidney disease, without long-term current use of insulin ([place]) 06/25/2019  . Acute pain of both knees  06/25/2019  . Bloating 11/20/2019  . Epigastric pain 11/20/2019  . Carpal tunnel syndrome, bilateral 11/23/2019  . Demyelinating disease of central nervous system ([place]) 11/26/2019  . White matter abnormality on MRI of brain 01/15/2020  . Numbness 01/15/2020  . Abscess of right axilla 04/18/2020  . Abnormal MRI, cervical spine 04/30/2020   Resolved Ambulatory Problems    Diagnosis Date Noted  . Cervical radiculopathy 04/09/2019  . Cervical pain (neck) 11/20/2019   Past Medical History:  Diagnosis Date  . Chronic kidney disease   . Hypertension       Review of Systems See HPI.     Objective:   Physical Exam    .. Results for orders placed or performed in visit on 06/10/20  POCT glycosylated hemoglobin (Hb A1C)  Result Value Ref Range   Hemoglobin A1C 5.6 4.0 - 5.6 %   HbA1c POC (<> result, manual entry)     HbA1c, POC (prediabetic range)     HbA1c, POC (controlled diabetic range)         Assessment & Plan:  Marland KitchenMarland KitchenJocelynne was seen today for abscess and diabetes.  Diagnoses and all orders for this visit:  Type 2 diabetes mellitus with other specified complication, with long-term current use of insulin (HCC) -     POCT glycosylated hemoglobin (Hb A1C) -     Semaglutide, 1 MG/DOSE, (OZEMPIC, 1 MG/DOSE,) 4 MG/3ML SOPN; INJECT 1 MG INTO THE SKIN ONCE WEEKLY  Hidradenitis suppurativa -     doxycycline (VIBRA-TABS) 100 MG tablet; Take 1 tablet (100 mg total)  by mouth 2 (two) times daily.  Abscess of right axilla -     doxycycline (VIBRA-TABS) 100 MG tablet; Take 1 tablet (100 mg total) by mouth 2 (two) times daily.   A1C looks great.  Continue on same medications. Tresiba/metformin/ozempic. May decrease tresiba if noticing morning sugars below 100.  On statin.  BP to goal.  UTD foot exam.  Needs eye exam.  Covid/flu/pneumonia vaccine UTD.  Follow up in 3 months.   Seeing dermatology. She is having another flare. Sent doxycycline. Warm compresses.   Ok to follow  up with ortho surgeon to consider needed anterior decompression surgery.

## 2020-06-16 ENCOUNTER — Encounter: Payer: Self-pay | Admitting: Physician Assistant

## 2020-08-18 ENCOUNTER — Other Ambulatory Visit: Payer: Self-pay | Admitting: Physician Assistant

## 2020-08-18 DIAGNOSIS — N951 Menopausal and female climacteric states: Secondary | ICD-10-CM

## 2020-08-18 DIAGNOSIS — E6609 Other obesity due to excess calories: Secondary | ICD-10-CM

## 2020-08-18 DIAGNOSIS — Z6834 Body mass index (BMI) 34.0-34.9, adult: Secondary | ICD-10-CM

## 2020-08-19 NOTE — Telephone Encounter (Signed)
This is not on medication list anymore, last sent #90 October 2021.

## 2020-08-27 ENCOUNTER — Other Ambulatory Visit: Payer: Self-pay | Admitting: Physician Assistant

## 2020-08-27 DIAGNOSIS — E6609 Other obesity due to excess calories: Secondary | ICD-10-CM

## 2020-09-03 ENCOUNTER — Other Ambulatory Visit: Payer: Self-pay | Admitting: Surgery

## 2020-09-06 ENCOUNTER — Other Ambulatory Visit: Payer: Self-pay | Admitting: Physician Assistant

## 2020-09-06 DIAGNOSIS — E6609 Other obesity due to excess calories: Secondary | ICD-10-CM

## 2020-09-06 DIAGNOSIS — G5603 Carpal tunnel syndrome, bilateral upper limbs: Secondary | ICD-10-CM

## 2020-09-06 DIAGNOSIS — M5030 Other cervical disc degeneration, unspecified cervical region: Secondary | ICD-10-CM

## 2020-09-09 ENCOUNTER — Ambulatory Visit (INDEPENDENT_AMBULATORY_CARE_PROVIDER_SITE_OTHER): Payer: 59 | Admitting: Physician Assistant

## 2020-09-09 DIAGNOSIS — Z5329 Procedure and treatment not carried out because of patient's decision for other reasons: Secondary | ICD-10-CM

## 2020-09-09 NOTE — Progress Notes (Signed)
No show

## 2020-09-22 ENCOUNTER — Other Ambulatory Visit: Payer: Self-pay

## 2020-09-22 ENCOUNTER — Ambulatory Visit (INDEPENDENT_AMBULATORY_CARE_PROVIDER_SITE_OTHER): Payer: Self-pay | Admitting: Physician Assistant

## 2020-09-22 ENCOUNTER — Encounter: Payer: Self-pay | Admitting: Physician Assistant

## 2020-09-22 ENCOUNTER — Other Ambulatory Visit: Payer: Self-pay | Admitting: Physician Assistant

## 2020-09-22 VITALS — BP 142/86 | HR 90 | Temp 97.8°F | Resp 20 | Ht 66.0 in | Wt 206.0 lb

## 2020-09-22 DIAGNOSIS — E1169 Type 2 diabetes mellitus with other specified complication: Secondary | ICD-10-CM

## 2020-09-22 DIAGNOSIS — Z6834 Body mass index (BMI) 34.0-34.9, adult: Secondary | ICD-10-CM

## 2020-09-22 DIAGNOSIS — Z794 Long term (current) use of insulin: Secondary | ICD-10-CM

## 2020-09-22 DIAGNOSIS — E1122 Type 2 diabetes mellitus with diabetic chronic kidney disease: Secondary | ICD-10-CM

## 2020-09-22 DIAGNOSIS — F4381 Prolonged grief disorder: Secondary | ICD-10-CM

## 2020-09-22 DIAGNOSIS — L02411 Cutaneous abscess of right axilla: Secondary | ICD-10-CM

## 2020-09-22 DIAGNOSIS — F330 Major depressive disorder, recurrent, mild: Secondary | ICD-10-CM

## 2020-09-22 DIAGNOSIS — L732 Hidradenitis suppurativa: Secondary | ICD-10-CM

## 2020-09-22 DIAGNOSIS — E6609 Other obesity due to excess calories: Secondary | ICD-10-CM

## 2020-09-22 DIAGNOSIS — F4329 Adjustment disorder with other symptoms: Secondary | ICD-10-CM

## 2020-09-22 LAB — POCT GLYCOSYLATED HEMOGLOBIN (HGB A1C): Hemoglobin A1C: 6 % — AB (ref 4.0–5.6)

## 2020-09-22 MED ORDER — METFORMIN HCL 1000 MG PO TABS: 1000.0000 mg | ORAL_TABLET | Freq: Two times a day (BID) | ORAL | 3 refills | Status: DC

## 2020-09-22 MED ORDER — DEXCOM G6 SENSOR MISC
3 refills | Status: DC
Start: 2020-09-22 — End: 2021-12-03

## 2020-09-22 MED ORDER — PHENTERMINE HCL 15 MG PO CAPS: 15.0000 mg | ORAL_CAPSULE | ORAL | 0 refills | Status: DC

## 2020-09-22 MED ORDER — DOXYCYCLINE HYCLATE 100 MG PO TABS: 100.0000 mg | ORAL_TABLET | Freq: Two times a day (BID) | ORAL | 0 refills | Status: DC

## 2020-09-22 MED ORDER — ESCITALOPRAM OXALATE 10 MG PO TABS: 10.0000 mg | ORAL_TABLET | Freq: Every day | ORAL | 0 refills | Status: DC

## 2020-09-22 MED ORDER — OZEMPIC (1 MG/DOSE) 4 MG/3ML ~~LOC~~ SOPN: PEN_INJECTOR | SUBCUTANEOUS | 2 refills | Status: DC

## 2020-09-22 MED ORDER — DEXCOM G6 RECEIVER DEVI: 1.0000 | Freq: Four times a day (QID) | 4 refills | Status: DC | PRN

## 2020-09-22 NOTE — Progress Notes (Signed)
Subjective:    Patient ID: Nina Trujillo, female    DOB: 12-23-1965, 55 y.o.   MRN: [id number]  HPI  Pt is a 55 yo female with HTN, T2DM, CKD, hidradenitis suppurativa, Depression who presents to the clinic for 3 month follow up.   Her mood is not great.  She denies any suicidal thoughts or homicidal idealizations.  She is having a hard time processing the death of her sister and mother.  She feels lonely.  She continues to live with her daughter.  She has no real motivation to go anywhere. She is not in counseling.   She is checking her sugars. They are 89-116 in morning. She is taking medications and tolerating well. No hypoglycemic events. She has recurrent infections on right axilla. No CP, palpitations, headaches, or vision changes.   .. Active Ambulatory Problems    Diagnosis Date Noted  . Tobacco dependence 05/31/2014  . Abnormal weight gain 05/31/2014  . Obese 05/31/2014  . Essential hypertension, benign 05/31/2014  . Diabetes mellitus without complication ([place]) 74/25/9563  . Menopausal symptoms 05/31/2014  . Hidradenitis suppurativa 12/13/2014  . CKD (chronic kidney disease) stage 3, GFR 30-59 ml/min (HCC) 12/16/2014  . Class 1 obesity due to excess calories with serious comorbidity and body mass index (BMI) of 34.0 to 34.9 in adult 03/21/2015  . Depression 03/21/2015  . Microalbuminuria due to type 2 diabetes mellitus ([place]) 08/25/2015  . Allergic rhinitis due to allergen 08/08/2017  . Upper back pain 05/19/2018  . Benign paroxysmal positional vertigo due to bilateral vestibular disorder 05/19/2018  . Mixed hyperlipidemia 05/22/2018  . Dyslipidemia, goal LDL below 70 05/22/2018  . DDD (degenerative disc disease), cervical 04/04/2019  . Type 2 diabetes mellitus with chronic kidney disease, without long-term current use of insulin ([place]) 06/25/2019  . Acute pain of both knees 06/25/2019  . Bloating 11/20/2019  . Epigastric pain 11/20/2019  . Carpal tunnel syndrome,  bilateral 11/23/2019  . Demyelinating disease of central nervous system ([place]) 11/26/2019  . White matter abnormality on MRI of brain 01/15/2020  . Numbness 01/15/2020  . Abscess of right axilla 04/18/2020  . Abnormal MRI, cervical spine 04/30/2020   Resolved Ambulatory Problems    Diagnosis Date Noted  . Cervical radiculopathy 04/09/2019  . Cervical pain (neck) 11/20/2019   Past Medical History:  Diagnosis Date  . Chronic kidney disease   . Hypertension       Review of Systems See HPI.     Objective:   Physical Exam Vitals reviewed.  Constitutional:      Appearance: Normal appearance. She is obese.  Cardiovascular:     Rate and Rhythm: Normal rate and regular rhythm.     Pulses: Normal pulses.  Pulmonary:     Effort: Pulmonary effort is normal.     Breath sounds: Normal breath sounds.  Skin:    Comments: Right axilla multiple small abscesses and scarring. Some draining and warm and red to appearance and touch.   Neurological:     General: No focal deficit present.     Mental Status: She is alert and oriented to person, place, and time.  Psychiatric:     Comments: tearful    ..      .. Results for orders placed or performed in visit on 09/22/20  POCT glycosylated hemoglobin (Hb A1C)  Result Value Ref Range   Hemoglobin A1C 6.0 (A) 4.0 - 5.6 %   HbA1c POC (<> result, manual entry)     HbA1c,  POC (prediabetic range)     HbA1c, POC (controlled diabetic range)     .[name] Depression screen [facility] 2/9 09/24/2020 02/18/2020 04/04/2019 01/02/2019 05/19/2018  Decreased Interest - 2 2 1 3   Down, Depressed, Hopeless - 1 1 1 3   PHQ - 2 Score - 3 3 2 6   Altered sleeping - 3 1 1 3   Tired, decreased energy - 2 2 2 2   Change in appetite - 2 2 2 2   Feeling bad or failure about yourself  - 1 0 1 3  Trouble concentrating - 2 2 1 2   Moving slowly or fidgety/restless - 0 2 0 0  Suicidal thoughts - 0 0 0 0  PHQ-9 Score - 13 12 9 18   Difficult doing work/chores Very difficult  Somewhat difficult Not difficult at all Not difficult at all Very difficult    Assessment & Plan:  Marland KitchenMarland KitchenDijon was seen today for follow-up.  Diagnoses and all orders for this visit:  Hidradenitis suppurativa -     doxycycline (VIBRA-TABS) 100 MG tablet; Take 1 tablet (100 mg total) by mouth 2 (two) times daily.  Abscess of right axilla -     doxycycline (VIBRA-TABS) 100 MG tablet; Take 1 tablet (100 mg total) by mouth 2 (two) times daily.  Class 1 obesity due to excess calories with serious comorbidity and body mass index (BMI) of 34.0 to 34.9 in adult -     metFORMIN (GLUCOPHAGE) 1000 MG tablet; Take 1 tablet (1,000 mg total) by mouth 2 (two) times daily with a meal. -     phentermine 15 MG capsule; Take 1 capsule (15 mg total) by mouth every morning.  Type 2 diabetes mellitus with chronic kidney disease, without long-term current use of insulin, unspecified CKD stage (HCC) -     Continuous Blood Gluc Receiver (DEXCOM G6 RECEIVER) DEVI; 1 Device by Does not apply route 4 (four) times daily as needed. -     Continuous Blood Gluc Sensor (DEXCOM G6 SENSOR) MISC; Use to check blood sugars -     metFORMIN (GLUCOPHAGE) 1000 MG tablet; Take 1 tablet (1,000 mg total) by mouth 2 (two) times daily with a meal. -     Semaglutide, 1 MG/DOSE, (OZEMPIC, 1 MG/DOSE,) 4 MG/3ML SOPN; INJECT 1 MG INTO THE SKIN ONCE WEEKLY -     POCT glycosylated hemoglobin (Hb A1C)  Type 2 diabetes mellitus with other specified complication, with long-term current use of insulin (HCC) -     POCT glycosylated hemoglobin (Hb A1C)  Mild episode of recurrent major depressive disorder (HCC) -     escitalopram (LEXAPRO) 10 MG tablet; Take 1 tablet (10 mg total) by mouth daily.   PHQ-9 is not to goal.  Patient reports lots of problems with grieving and mood. Will refer to counseling.  Increase Lexapro to 10 mg.  Follow-up in 4 to 6 weeks.  Patient has hidarentitis suppurativa and needs surgery.  She is going to call and  reschedule the surgery.  She does appear to have some active infection and abscesses.  Doxycycline sent to the pharmacy for 10 days. Pt declined smoking cessation.   A1C to goal.  Continue on metformin and Ozempic. Not on ACE/ARB BP to goal.  Needs microalbumin.  On statin.  Needs eye exam.  Foot exam UTD.  Covid/flu/pnuemonia UTD.   [name].Discussed low carb diet with 1500 calories and 80g of protein.  Exercising at least 150 minutes a week.  [employer]itness Pal could be a Microbiologist.  Request phentermine. Given for 3 months to take 1/2 tablet for weight jump start.   Declined smoking cessation.

## 2020-09-22 NOTE — Patient Instructions (Addendum)
Doxycycline for 10 days.  Increase lexapro 10mg .

## 2020-09-26 ENCOUNTER — Encounter: Payer: Self-pay | Admitting: Physician Assistant

## 2020-09-30 ENCOUNTER — Ambulatory Visit: Payer: Medicaid Other | Admitting: Sports Medicine

## 2020-10-06 ENCOUNTER — Ambulatory Visit: Payer: Medicaid Other | Admitting: Sports Medicine

## 2020-10-09 ENCOUNTER — Other Ambulatory Visit: Payer: Self-pay

## 2020-10-09 ENCOUNTER — Ambulatory Visit (INDEPENDENT_AMBULATORY_CARE_PROVIDER_SITE_OTHER): Payer: 59 | Admitting: Sports Medicine

## 2020-10-09 DIAGNOSIS — M5030 Other cervical disc degeneration, unspecified cervical region: Secondary | ICD-10-CM

## 2020-10-09 LAB — BASIC METABOLIC PANEL WITH GFR
BUN/Creatinine Ratio: 12 (calc) (ref 6–22)
BUN: 16 mg/dL (ref 7–25)
CO2: 27 mmol/L (ref 20–32)
Calcium: 9.9 mg/dL (ref 8.6–10.4)
Chloride: 106 mmol/L (ref 98–110)
Creat: 1.37 mg/dL — ABNORMAL HIGH (ref 0.50–1.05)
GFR, Est African American: 50 mL/min/{1.73_m2} — ABNORMAL LOW (ref >=60)
GFR, Est Non African American: 43 mL/min/{1.73_m2} — ABNORMAL LOW (ref >=60)
Glucose, Bld: 158 mg/dL — ABNORMAL HIGH (ref 65–139)
Potassium: 4.2 mmol/L (ref 3.5–5.3)
Sodium: 139 mmol/L (ref 135–146)

## 2020-10-09 MED ORDER — PREDNISONE 20 MG PO TABS: 20.0000 mg | ORAL_TABLET | Freq: Every day | ORAL | 0 refills | Status: AC

## 2020-10-09 NOTE — Assessment & Plan Note (Addendum)
This is a pleasant 55 year old female, we have treated her for some time now for neck pain, she had an MRI that showed central cervical canal stenosis, she also had an area of spinal cord edema well below the area of stenosis. We referred her to Dr. Lynann Bologna, he did agree to do an ACDF however there was a financial issue and she was not able to proceed. Due to the area of demyelinating disease I suspected transverse myelitis or multiple sclerosis, we sent her to neurology, ultimately lumbar puncture and oligoclonal band testing was negative. We do need to do an updated cervical spine MRI with and without contrast to ensure that the area of T2 spinal cord edema has resolved, considering her increasing pain we will proceed with 5 days of prednisone at 20 mg daily, we will do a low-dose as she is a smoker, and she does have a daughter who had AVN of the hip from high-dose steroids necessitating hip arthroplasty. I will also going order a left C6-C7 interlaminar epidural. She will need a BMP for her contrast MRI. Return to see me in a month.  This is a chronic process with exacerbation and pharmacologic management.

## 2020-10-09 NOTE — Progress Notes (Signed)
    Procedures performed today:    None.  Independent interpretation of notes and tests performed by another provider:   None.  Brief History, Exam, Impression, and Recommendations:    DDD (degenerative disc disease), cervical This is a pleasant 55 year old female, we have treated her for some time now for neck pain, she had an MRI that showed central cervical canal stenosis, she also had an area of spinal cord edema well below the area of stenosis. We referred her to Dr. Lynann Bologna, he did agree to do an ACDF however there was a financial issue and she was not able to proceed. Due to the area of demyelinating disease I suspected transverse myelitis or multiple sclerosis, we sent her to neurology, ultimately lumbar puncture and oligoclonal band testing was negative. We do need to do an updated cervical spine MRI with and without contrast to ensure that the area of T2 spinal cord edema has resolved, considering her increasing pain we will proceed with 5 days of prednisone at 20 mg daily, we will do a low-dose as she is a smoker, and she does have a daughter who had AVN of the hip from high-dose steroids necessitating hip arthroplasty. I will also going order a left C6-C7 interlaminar epidural. She will need a BMP for her contrast MRI. Return to see me in a month.  This is a chronic process with exacerbation and pharmacologic management.    ___________________________________________ Gwen Her. Dianah Field, M.D., ABFM., CAQSM. Primary Care and [place] Instructor of [place] [facility] [employer]

## 2020-10-14 ENCOUNTER — Encounter: Payer: Self-pay | Admitting: Physician Assistant

## 2020-10-14 ENCOUNTER — Other Ambulatory Visit: Payer: Self-pay | Admitting: Physician Assistant

## 2020-10-14 DIAGNOSIS — F330 Major depressive disorder, recurrent, mild: Secondary | ICD-10-CM

## 2020-10-14 DIAGNOSIS — E119 Type 2 diabetes mellitus without complications: Secondary | ICD-10-CM

## 2020-10-14 MED ORDER — TRESIBA FLEXTOUCH 100 UNIT/ML ~~LOC~~ SOPN: 13.0000 [IU] | PEN_INJECTOR | Freq: Every day | SUBCUTANEOUS | 1 refills | Status: DC

## 2020-10-14 MED ORDER — BUPROPION HCL ER (SR) 150 MG PO TB12: 150.0000 mg | ORAL_TABLET | Freq: Two times a day (BID) | ORAL | 1 refills | Status: DC

## 2020-10-15 DIAGNOSIS — Z0271 Encounter for disability determination: Secondary | ICD-10-CM

## 2020-10-22 ENCOUNTER — Ambulatory Visit
Admission: RE | Admit: 2020-10-22 | Discharge: 2020-10-22 | Disposition: A | Discharge status: Home / Self Care | Payer: 59 | Source: Ambulatory Visit | Attending: Sports Medicine | Admitting: Sports Medicine

## 2020-10-22 DIAGNOSIS — M5030 Other cervical disc degeneration, unspecified cervical region: Secondary | ICD-10-CM

## 2020-10-22 IMAGING — MR MR CERVICAL SPINE WO/W CM
6 of 9 series · 27 of 48 positions shown · IV contrast (multihance)
Comparison: MRI of the cervical spine [DATE].

CLINICAL DATA: Multiple sclerosis. Revaluation of spinal cord
myelomalacia. Degenerative disc disease.

EXAM:
MRI CERVICAL SPINE WITHOUT AND WITH CONTRAST
TECHNIQUE: Multiplanar and multiecho pulse sequences of the cervical spine, to
include the craniocervical junction and cervicothoracic junction,
were obtained without and with intravenous contrast.
CONTRAST:  19mL MULTIHANCE GADOBENATE DIMEGLUMINE 529 MG/ML IV SOLN

[Series 3: T2 · sagittal · 3.0mm · 0.41mm/px · 4 of 19 slices shown (1 of 2)]
[im 1/19]
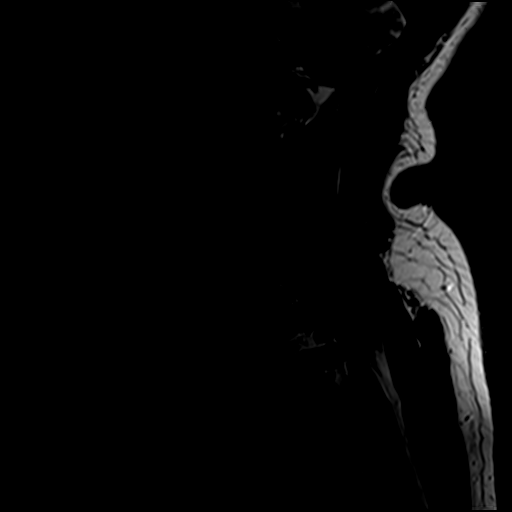
[im 7/19]
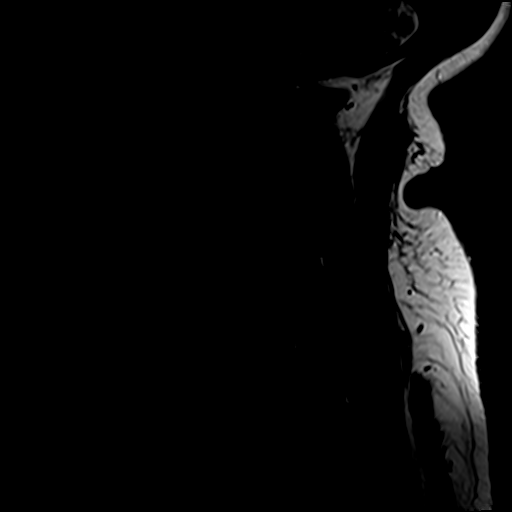
[im 13/19]
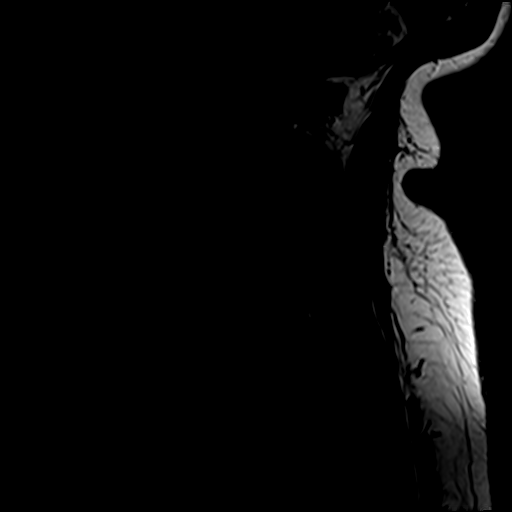
[im 19/19]
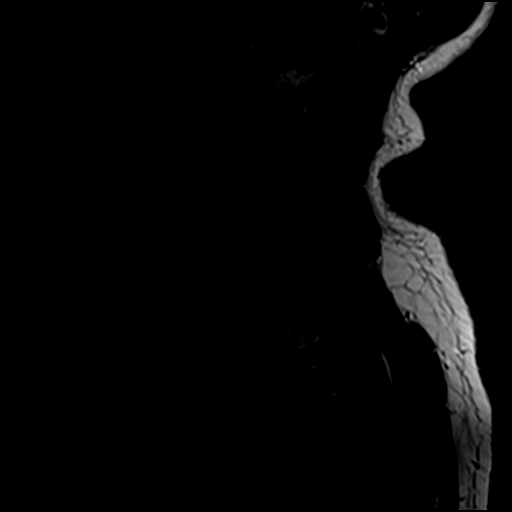

[Series 4: T1 · sagittal · 3.0mm · 0.41mm/px · 5 of 19 slices shown (1 of 2)]
[im 1/19]
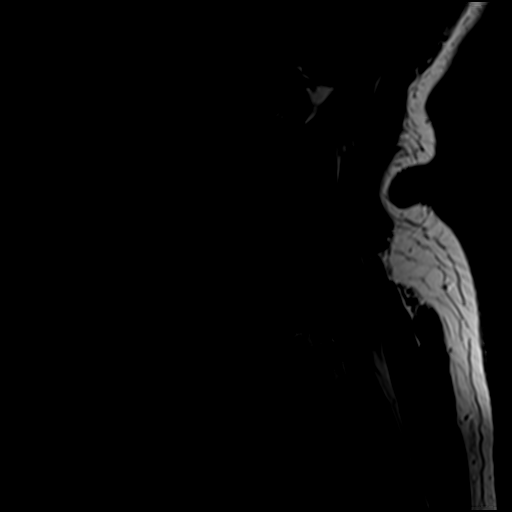
[im 5/19]
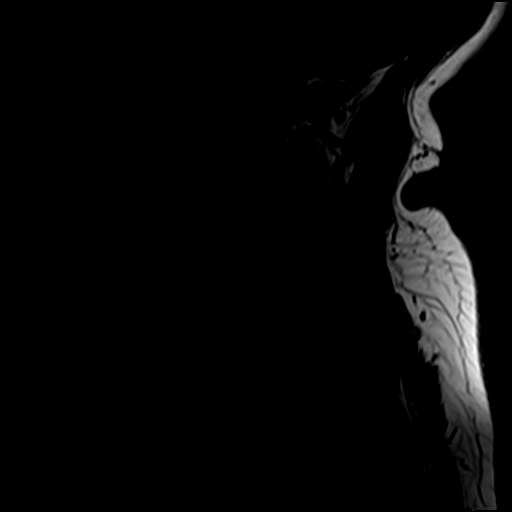
[im 10/19]
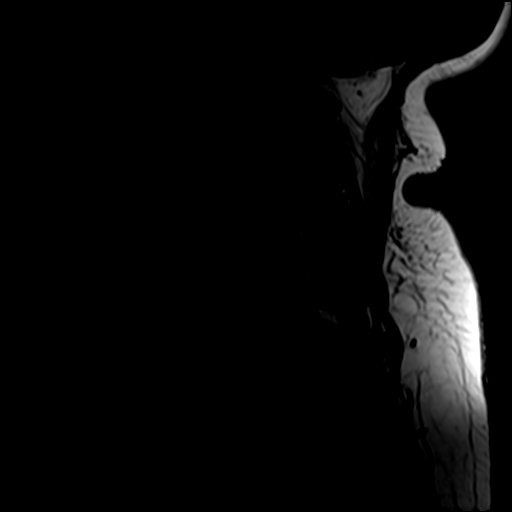
[im 14/19]
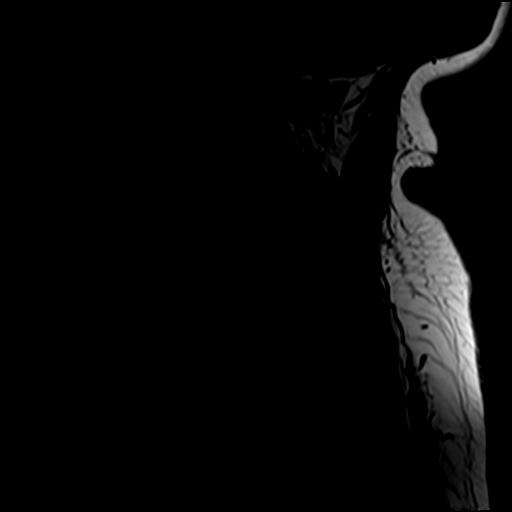
[im 19/19]
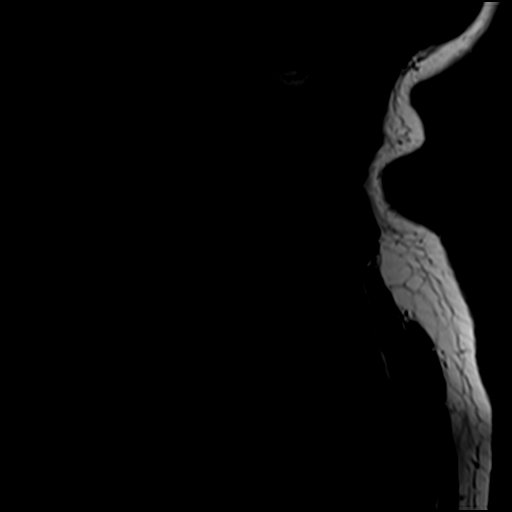

[Series 5: STIR · sagittal · 3.0mm · 0.82mm/px · 1 of 19 slices shown]
[im 1/19]
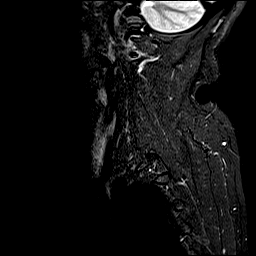

[Series 7: T1 · axial · 3.0mm · 0.35mm/px · z∈[-65,+26]mm · 6 of 26 slices shown (2 of 2)]
[im 1/26]
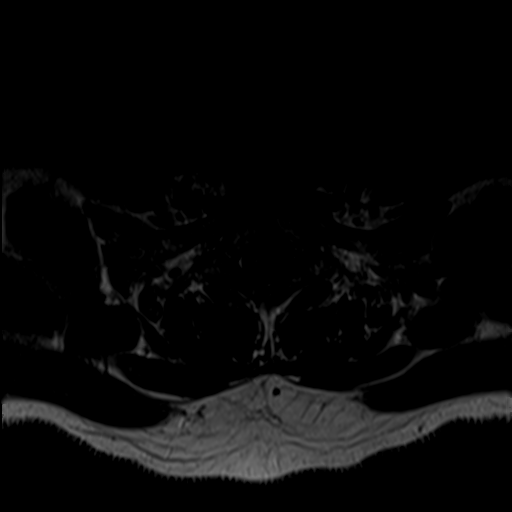
[im 6/26]
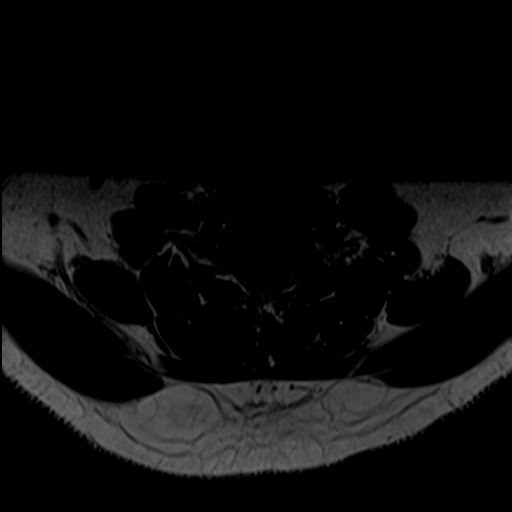
[im 11/26]
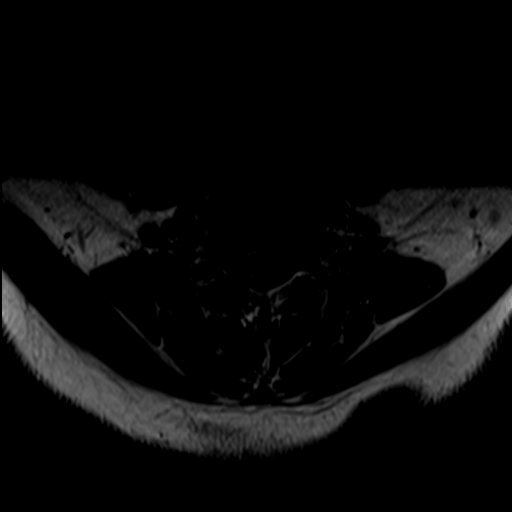
[im 16/26]
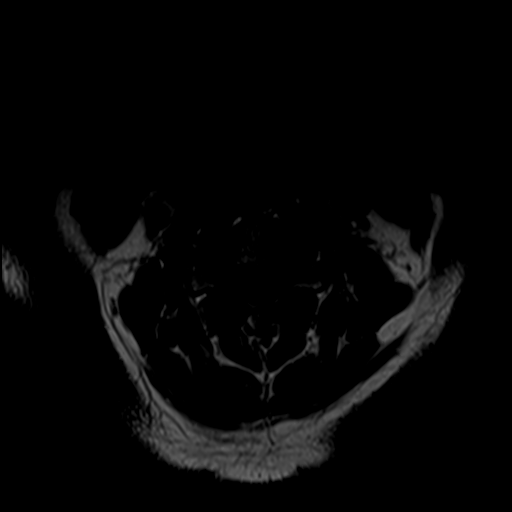
[im 21/26]
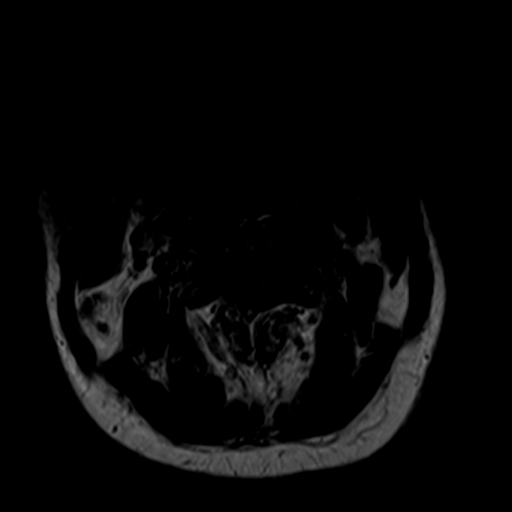
[im 26/26]
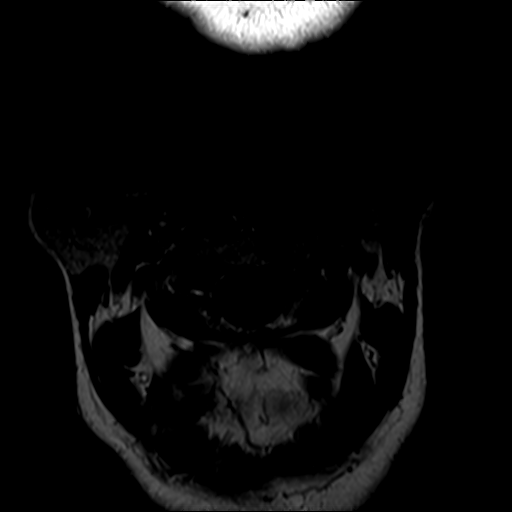

[Series 8: T2 · axial · 3.0mm · 0.70mm/px · z∈[-65,+26]mm · 6 of 26 slices shown (2 of 2)]
[im 1/26]
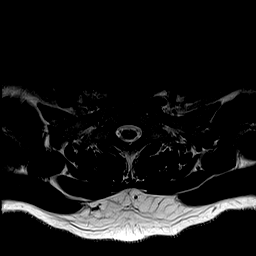
[im 6/26]
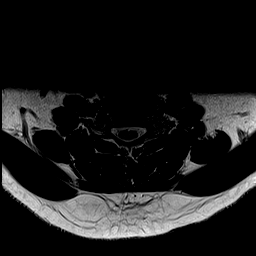
[im 11/26]
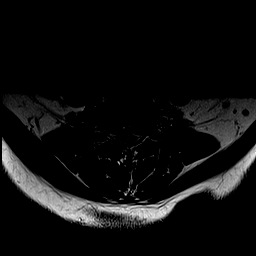
[im 16/26]
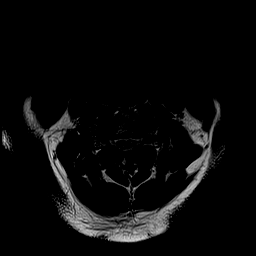
[im 21/26]
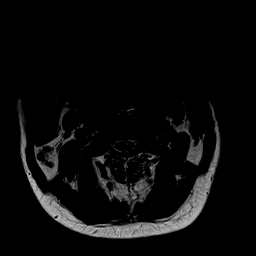
[im 26/26]
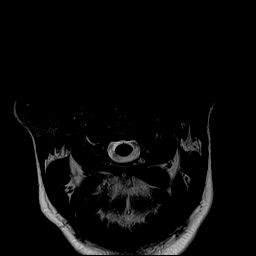

[Series 9: T1 fat-sat post-contrast · sagittal · 3.0mm · 0.82mm/px · 5 of 19 slices shown]
[im 1/19]
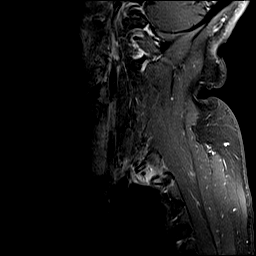
[im 5/19]
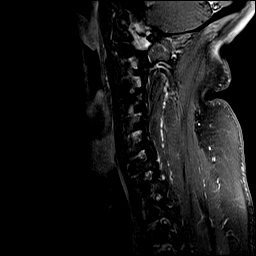
[im 10/19]
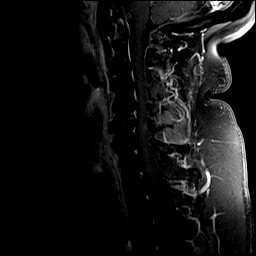
[im 14/19]
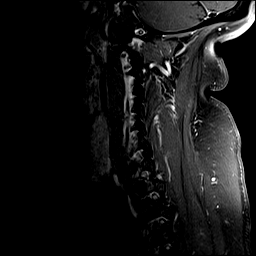
[im 19/19]
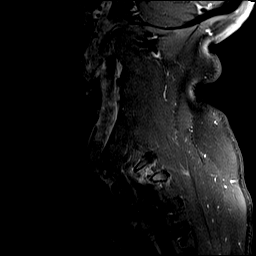

[27 of 48 positions shown; findings below may reference images not displayed]

FINDINGS: Alignment: Mild reversal of the cervical curvature.

Vertebrae: No fracture, evidence of discitis, or bone lesion.

Cord: Near resolution of the T2 hyperintense lesion with contrast
enhancement on the left side of the cord at the C6 level, most
consistent with a demyelinating plaque. No new focus of abnormal
contrast enhancement identified.

Posterior Fossa, vertebral arteries, paraspinal tissues: Negative.

Disc levels:

C2-3: Facet degenerative changes. No significant spinal canal or
neural foraminal stenosis.

C3-4: Posterior disc osteophyte complex resulting in mild spinal
canal stenosis. Uncovertebral and facet degenerative changes
resulting in mild right and moderate left neural foraminal
narrowing.

C4-5: Posterior disc osteophyte complex resulting in moderate spinal
canal stenosis. Uncovertebral and facet degenerative changes
resulting in severe bilateral neural foraminal narrowing.

C5-6: Posterior disc osteophyte complex resulting in moderate spinal
canal stenosis. Uncovertebral and facet degenerative changes
resulting moderate right and severe left neural foraminal narrowing.

C6-7: Small posterior disc protrusion. No significant spinal canal
or neural foraminal stenosis.

C7-T1: No spinal canal or neural foraminal stenosis.

Degenerative changes of the cervical spine appear stable.
IMPRESSION: 1. Near solution of the T2 hyperintense lesion within the left side
of the cord at the C6 level now with faint contrast enhancement,
most consistent with demyelinating plaque.
2. Degenerative changes of the cervical spine with moderate spinal
canal stenosis at C4-5 and C5-6.
3. Severe bilateral neural foraminal narrowing at C4-5.
4. Moderate right and severe left neural foraminal narrowing at
C5-6.

## 2020-10-22 MED ORDER — GADOBENATE DIMEGLUMINE 529 MG/ML IV SOLN
19.0000 mL | Freq: Once | INTRAVENOUS | Status: AC | PRN
  Administered 2020-10-22: 19 mL via INTRAVENOUS

## 2020-10-27 ENCOUNTER — Other Ambulatory Visit: Payer: 59

## 2020-10-29 ENCOUNTER — Ambulatory Visit: Payer: 59 | Admitting: Neurology

## 2020-10-31 ENCOUNTER — Ambulatory Visit
Admission: RE | Admit: 2020-10-31 | Discharge: 2020-10-31 | Disposition: A | Discharge status: Home / Self Care | Payer: 59 | Source: Ambulatory Visit | Attending: Sports Medicine | Admitting: Sports Medicine

## 2020-10-31 DIAGNOSIS — M5030 Other cervical disc degeneration, unspecified cervical region: Secondary | ICD-10-CM

## 2020-10-31 IMAGING — XA DG INJECT/[PERSON_NAME] INC NEEDLE/CATH/PLC EPI/CERV/THOR W/IMG
2 series · 2 of 2 positions shown · non-contrast
Comparison: none

CLINICAL DATA: Cervical spondylosis without myelopathy. Left arm
and right shoulder pain.

[Series 1: ortho standard · 1 of 1 slices shown (1 of 2)]
[im 1/1]
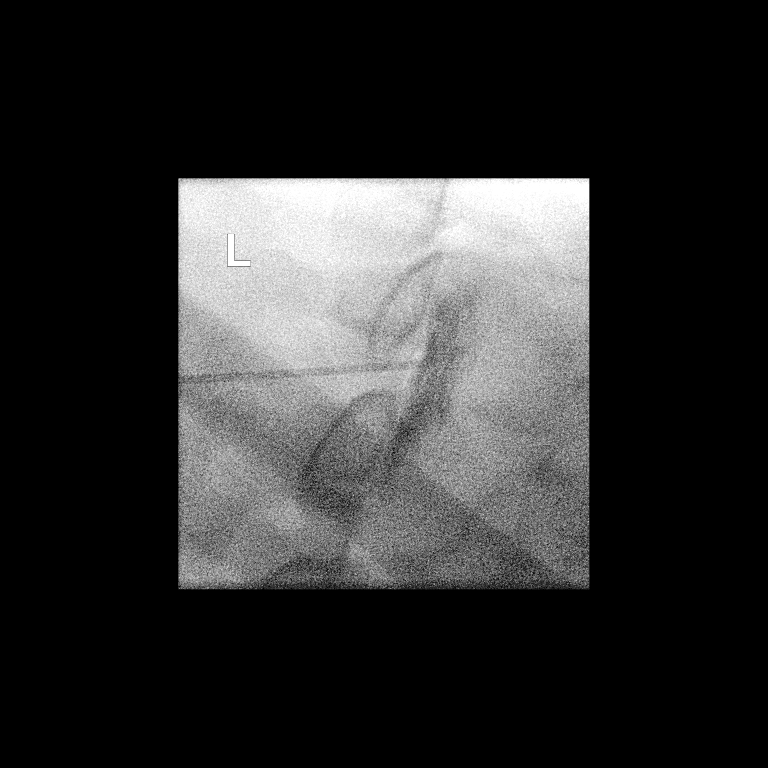

[Series 2: ortho standard · 1 of 1 slices shown (2 of 2)]
[im 1/1]
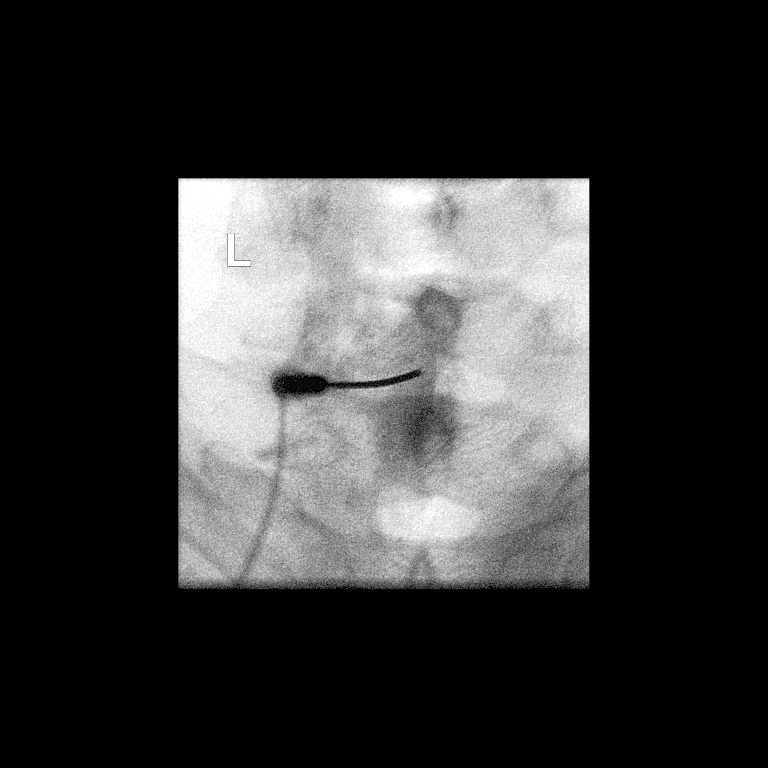

[2 of 2 positions shown; findings below may reference images not displayed]

FLUOROSCOPY TIME:  Fluoroscopy Time: 17 seconds

Radiation Exposure Index: 6.51 microGray*m^2

PROCEDURE:
The procedure, risks, benefits, and alternatives were explained to
the patient. Questions regarding the procedure were encouraged and
answered. The patient understands and consents to the procedure.

CERVICAL EPIDURAL INJECTION

An interlaminar approach was performed on the left at C7-T1. A
inch 20 gauge epidural needle was advanced using loss-of-resistance
technique.

DIAGNOSTIC EPIDURAL INJECTION

Injection of Isovue-M 300 shows a good epidural pattern with spread
above and below the level of needle placement, primarily on the
left. No vascular opacification is seen.

THERAPEUTIC EPIDURAL INJECTION

1.5 ml of Kenalog 40 mixed with 2 ml of normal saline were then
instilled. The procedure was well-tolerated, and the patient was
discharged thirty minutes following the injection in good condition.
IMPRESSION: Technically successful interlaminar epidural injection on the left
at C7-T1.

## 2020-10-31 MED ORDER — TRIAMCINOLONE ACETONIDE 40 MG/ML IJ SUSP (RADIOLOGY)
60.0000 mg | Freq: Once | INTRAMUSCULAR | Status: AC
  Administered 2020-10-31: 60 mg via EPIDURAL

## 2020-10-31 MED ORDER — IOPAMIDOL (ISOVUE-M 300) INJECTION 61%
1.0000 mL | Freq: Once | INTRAMUSCULAR | Status: AC | PRN
  Administered 2020-10-31: 1 mL via EPIDURAL

## 2020-10-31 NOTE — Discharge Instructions (Signed)

## 2020-11-28 ENCOUNTER — Ambulatory Visit (INDEPENDENT_AMBULATORY_CARE_PROVIDER_SITE_OTHER): Payer: 59 | Admitting: Physician Assistant

## 2020-11-28 ENCOUNTER — Other Ambulatory Visit: Payer: Self-pay

## 2020-11-28 VITALS — BP 129/57 | HR 110 | Temp 98.4°F | Ht 66.0 in | Wt 202.0 lb

## 2020-11-28 DIAGNOSIS — M545 Low back pain, unspecified: Secondary | ICD-10-CM

## 2020-11-28 DIAGNOSIS — F331 Major depressive disorder, recurrent, moderate: Secondary | ICD-10-CM

## 2020-11-28 DIAGNOSIS — R350 Frequency of micturition: Secondary | ICD-10-CM | POA: Diagnosis not present

## 2020-11-28 DIAGNOSIS — F4381 Prolonged grief disorder: Secondary | ICD-10-CM

## 2020-11-28 DIAGNOSIS — F4329 Adjustment disorder with other symptoms: Secondary | ICD-10-CM

## 2020-11-28 DIAGNOSIS — R8290 Unspecified abnormal findings in urine: Secondary | ICD-10-CM | POA: Diagnosis not present

## 2020-11-28 LAB — POCT URINALYSIS DIP (CLINITEK)
Blood, UA: NEGATIVE
Glucose, UA: NEGATIVE mg/dL
Leukocytes, UA: NEGATIVE
Nitrite, UA: NEGATIVE
POC PROTEIN,UA: 30 — AB
Spec Grav, UA: 1.02 (ref 1.010–1.025)
Urobilinogen, UA: 0.2 E.U./dL
pH, UA: 6 (ref 5.0–8.0)

## 2020-11-28 MED ORDER — ESCITALOPRAM OXALATE 20 MG PO TABS: 20.0000 mg | ORAL_TABLET | Freq: Every day | ORAL | 1 refills | Status: DC

## 2020-11-28 MED ORDER — NITROFURANTOIN MONOHYD MACRO 100 MG PO CAPS: 100.0000 mg | ORAL_CAPSULE | Freq: Two times a day (BID) | ORAL | 0 refills | Status: DC

## 2020-11-28 NOTE — Progress Notes (Signed)
Subjective:    Patient ID: Nina Trujillo, female    DOB: 02-Jan-1966, 55 y.o.   MRN: [id number]  HPI Pt is a 55 yo female with HTN, T2DM, CKD, MDD who presents to the clinic with worsening depression and urinary symptoms.   Pt has had extremely hard few years. Her sister and mother passed away. She has lost her home and living with her daughter. She has no job and applied for numerous jobs with no success. She finally got a job and the night before she was supposed to start they terminated the offer. She was crushed. She feels like she "has no life, no purpose, no benefit". She looks at everyone else life and then hers and feels like they are so lucky. Denies any SI/HC. She is taken or lexapro but does not seem to be helping with this. She is sleeping ok.   Started with urinary symptoms a few days ago. Increased frequency, urgency, odor. No fever, chills, abdominal, flank pain, nausea, vomiting. Not tried anything to make better. Feels like UTI she has had in the past.   Pt continues to smoke.   .. Active Ambulatory Problems    Diagnosis Date Noted   Tobacco dependence 05/31/2014   Abnormal weight gain 05/31/2014   Obese 05/31/2014   Essential hypertension, benign 05/31/2014   Diabetes mellitus without complication ([place]) 40/98/1191   Menopausal symptoms 05/31/2014   Hidradenitis suppurativa 12/13/2014   CKD (chronic kidney disease) stage 3, GFR 30-59 ml/min (HCC) 12/16/2014   Class 1 obesity due to excess calories with serious comorbidity and body mass index (BMI) of 34.0 to 34.9 in adult 03/21/2015   Depression 03/21/2015   Microalbuminuria due to type 2 diabetes mellitus ([place]) 08/25/2015   Allergic rhinitis due to allergen 08/08/2017   Upper back pain 05/19/2018   Benign paroxysmal positional vertigo due to bilateral vestibular disorder 05/19/2018   Mixed hyperlipidemia 05/22/2018   Dyslipidemia, goal LDL below 70 05/22/2018   DDD (degenerative disc disease), cervical  04/04/2019   Type 2 diabetes mellitus with chronic kidney disease, without long-term current use of insulin ([place]) 06/25/2019   Acute pain of both knees 06/25/2019   Bloating 11/20/2019   Epigastric pain 11/20/2019   Carpal tunnel syndrome, bilateral 11/23/2019   Demyelinating disease of central nervous system ([place]) 11/26/2019   White matter abnormality on MRI of brain 01/15/2020   Numbness 01/15/2020   Abscess of right axilla 04/18/2020   Abnormal MRI, cervical spine 04/30/2020   Moderate recurrent major depression ([place]) 11/29/2020   Resolved Ambulatory Problems    Diagnosis Date Noted   Cervical radiculopathy 04/09/2019   Cervical pain (neck) 11/20/2019   Past Medical History:  Diagnosis Date   Chronic kidney disease    Hypertension      Review of Systems See HPI.     Objective:   Physical Exam Vitals reviewed.  Constitutional:      Appearance: Normal appearance. She is obese.  HENT:     Head: Normocephalic.  Cardiovascular:     Rate and Rhythm: Normal rate and regular rhythm.     Pulses: Normal pulses.     Heart sounds: Normal heart sounds.  Pulmonary:     Effort: Pulmonary effort is normal.     Breath sounds: Normal breath sounds.  Abdominal:     General: Bowel sounds are normal. There is no distension.     Palpations: Abdomen is soft. There is no mass.     Tenderness: There is no  abdominal tenderness. There is no right CVA tenderness, left CVA tenderness, guarding or rebound.  Neurological:     General: No focal deficit present.     Mental Status: She is alert and oriented to person, place, and time.  Psychiatric:     Comments: Very flat affect.       .. Results for orders placed or performed in visit on 11/28/20  POCT URINALYSIS DIP (CLINITEK)  Result Value Ref Range   Color, UA yellow yellow   Clarity, UA clear clear   Glucose, UA negative negative mg/dL   Bilirubin, UA small (A) negative   Ketones, POC UA trace (5) (A) negative mg/dL   Spec Grav,  UA 1.020 1.010 - 1.025   Blood, UA negative negative   pH, UA 6.0 5.0 - 8.0   POC PROTEIN,UA =30 (A) negative, trace   Urobilinogen, UA 0.2 0.2 or 1.0 E.U./dL   Nitrite, UA Negative Negative   Leukocytes, UA Negative Negative       Assessment & Plan:  Nina Trujillo KitchenShamina was seen today for depression.  Diagnoses and all orders for this visit:  Moderate recurrent major depression (HCC) -     escitalopram (LEXAPRO) 20 MG tablet; Take 1 tablet (20 mg total) by mouth daily. -     Ambulatory referral to Psychology -     Urine Culture  Acute bilateral low back pain without sciatica -     POCT URINALYSIS DIP (CLINITEK) -     nitrofurantoin, macrocrystal-monohydrate, (MACROBID) 100 MG capsule; Take 1 capsule (100 mg total) by mouth 2 (two) times daily. -     Urine Culture  Urinary frequency -     POCT URINALYSIS DIP (CLINITEK) -     nitrofurantoin, macrocrystal-monohydrate, (MACROBID) 100 MG capsule; Take 1 capsule (100 mg total) by mouth 2 (two) times daily. -     Urine Culture  Abnormal urine odor -     POCT URINALYSIS DIP (CLINITEK) -     nitrofurantoin, macrocrystal-monohydrate, (MACROBID) 100 MG capsule; Take 1 capsule (100 mg total) by mouth 2 (two) times daily. -     Urine Culture  Pt is very depressed about her life right now. "She wants her life back".  Needs counseling to help process grief and majr life changes that have happened over the last 2 years and was never contacted with last referral.  Urgent referral placed today. Pt denies any SI/HC but she appears really down and hopeless today.  Increased lexapro to 20mg .  Go to UC in [place] if having thoughts or harming herself or feels desperate for something else to be done.  Follow up in 1 month.   UA positive for protein, ketones, bilirubin.  Will culture.  Weekend so started macrobid empirically. Pt will stop if culture does not grow bacteria.   Spent 30 minutes with patient discuss depression and treatment plan.

## 2020-11-29 ENCOUNTER — Encounter: Payer: Self-pay | Admitting: Physician Assistant

## 2020-11-29 DIAGNOSIS — F331 Major depressive disorder, recurrent, moderate: Secondary | ICD-10-CM | POA: Insufficient documentation

## 2020-11-30 LAB — URINE CULTURE
MICRO NUMBER:: 12126856
SPECIMEN QUALITY:: ADEQUATE

## 2020-11-30 NOTE — Progress Notes (Signed)
No significant bacteria growth. Stop antibiotic.

## 2020-12-01 ENCOUNTER — Other Ambulatory Visit: Payer: Self-pay | Admitting: Physician Assistant

## 2020-12-01 DIAGNOSIS — E6609 Other obesity due to excess calories: Secondary | ICD-10-CM

## 2020-12-01 DIAGNOSIS — Z6834 Body mass index (BMI) 34.0-34.9, adult: Secondary | ICD-10-CM

## 2020-12-12 ENCOUNTER — Other Ambulatory Visit: Payer: Self-pay | Admitting: Sports Medicine

## 2020-12-12 DIAGNOSIS — M5030 Other cervical disc degeneration, unspecified cervical region: Secondary | ICD-10-CM

## 2020-12-18 ENCOUNTER — Ambulatory Visit (INDEPENDENT_AMBULATORY_CARE_PROVIDER_SITE_OTHER): Payer: 59 | Admitting: Psychology

## 2020-12-18 DIAGNOSIS — F4321 Adjustment disorder with depressed mood: Secondary | ICD-10-CM | POA: Diagnosis not present

## 2020-12-23 ENCOUNTER — Ambulatory Visit (INDEPENDENT_AMBULATORY_CARE_PROVIDER_SITE_OTHER): Payer: 59 | Admitting: Physician Assistant

## 2020-12-23 VITALS — BP 124/61 | HR 64 | Temp 98.4°F | Ht 66.0 in | Wt 203.0 lb

## 2020-12-23 DIAGNOSIS — L732 Hidradenitis suppurativa: Secondary | ICD-10-CM | POA: Diagnosis not present

## 2020-12-23 DIAGNOSIS — E6609 Other obesity due to excess calories: Secondary | ICD-10-CM | POA: Diagnosis not present

## 2020-12-23 DIAGNOSIS — F331 Major depressive disorder, recurrent, moderate: Secondary | ICD-10-CM

## 2020-12-23 DIAGNOSIS — N1831 Chronic kidney disease, stage 3a: Secondary | ICD-10-CM

## 2020-12-23 DIAGNOSIS — E1122 Type 2 diabetes mellitus with diabetic chronic kidney disease: Secondary | ICD-10-CM

## 2020-12-23 DIAGNOSIS — Z6834 Body mass index (BMI) 34.0-34.9, adult: Secondary | ICD-10-CM

## 2020-12-23 DIAGNOSIS — N898 Other specified noninflammatory disorders of vagina: Secondary | ICD-10-CM

## 2020-12-23 LAB — POCT GLYCOSYLATED HEMOGLOBIN (HGB A1C): Hemoglobin A1C: 5.8 % — AB (ref 4.0–5.6)

## 2020-12-23 LAB — POCT UA - MICROALBUMIN
Albumin/Creatinine Ratio, Urine, POC: <30
Creatinine, POC: 300 mg/dL
Microalbumin Ur, POC: 30 mg/L

## 2020-12-23 LAB — WET PREP FOR TRICH, YEAST, CLUE
MICRO NUMBER:: 12219223
Specimen Quality: ADEQUATE

## 2020-12-23 MED ORDER — ESCITALOPRAM OXALATE 20 MG PO TABS: 20.0000 mg | ORAL_TABLET | Freq: Every day | ORAL | 1 refills | Status: DC

## 2020-12-23 MED ORDER — TOPIRAMATE 25 MG PO TABS: 25.0000 mg | ORAL_TABLET | Freq: Two times a day (BID) | ORAL | 1 refills | Status: DC

## 2020-12-23 MED ORDER — PHENTERMINE HCL 15 MG PO CAPS
15.0000 mg | ORAL_CAPSULE | ORAL | 0 refills | Status: DC
Start: 2020-12-23 — End: 2021-03-25

## 2020-12-23 MED ORDER — OZEMPIC (1 MG/DOSE) 4 MG/3ML ~~LOC~~ SOPN: PEN_INJECTOR | SUBCUTANEOUS | 2 refills | Status: DC

## 2020-12-23 MED ORDER — FLUCONAZOLE 150 MG PO TABS: 150.0000 mg | ORAL_TABLET | Freq: Every day | ORAL | 0 refills | Status: DC

## 2020-12-23 MED ORDER — CLINDAMYCIN PHOSPHATE 1 % EX GEL
Freq: Two times a day (BID) | CUTANEOUS | 0 refills | Status: DC
  Filled 2021-11-18: qty 30, 30d supply, fill #0

## 2020-12-23 NOTE — Progress Notes (Signed)
Negative for yeast, trich, clue cells.

## 2020-12-23 NOTE — Patient Instructions (Addendum)
Hibiclens once a month Clindamycin for new lesion Diflucan for yeast infection.

## 2020-12-23 NOTE — Progress Notes (Signed)
Subjective:    Patient ID: Nina Trujillo, female    DOB: 1965/10/17, 55 y.o.   MRN: [id number]  HPI Pt is a 55 yo obese female with T2DM, HTN, CKD 3, HLD, and hidradenitis suppurativa who presents to the clinic for medication refills.   Pt is taking metformin, tresbia, ozempic. SGLT-2 give her yeast infections. Not checking sugars. No known hypoglycemic events. She is staying active and watching diet.   She does have some itching thick vaginal discharge that start a few days ago. She just finished abx for hidradenitis suppurativa infection. She feels like it is yeast. She continues to have tiny bumps that drain pop up in groin and axilla. No fever, chills, body aches.   .. Active Ambulatory Problems    Diagnosis Date Noted   Tobacco dependence 05/31/2014   Abnormal weight gain 05/31/2014   Obese 05/31/2014   Essential hypertension, benign 05/31/2014   Diabetes mellitus without complication ([place]) 99991111   Menopausal symptoms 05/31/2014   Hidradenitis suppurativa 12/13/2014   CKD (chronic kidney disease) stage 3, GFR 30-59 ml/min (HCC) 12/16/2014   Class 1 obesity due to excess calories with serious comorbidity and body mass index (BMI) of 34.0 to 34.9 in adult 03/21/2015   Depression 03/21/2015   Microalbuminuria due to type 2 diabetes mellitus ([place]) 08/25/2015   Allergic rhinitis due to allergen 08/08/2017   Upper back pain 05/19/2018   Benign paroxysmal positional vertigo due to bilateral vestibular disorder 05/19/2018   Mixed hyperlipidemia 05/22/2018   Dyslipidemia, goal LDL below 70 05/22/2018   DDD (degenerative disc disease), cervical 04/04/2019   Type 2 diabetes mellitus with chronic kidney disease, without long-term current use of insulin ([place]) 06/25/2019   Acute pain of both knees 06/25/2019   Bloating 11/20/2019   Epigastric pain 11/20/2019   Carpal tunnel syndrome, bilateral 11/23/2019   Demyelinating disease of central nervous system ([place]) 11/26/2019    White matter abnormality on MRI of brain 01/15/2020   Numbness 01/15/2020   Abscess of right axilla 04/18/2020   Abnormal MRI, cervical spine 04/30/2020   Moderate recurrent major depression ([place]) 11/29/2020   Resolved Ambulatory Problems    Diagnosis Date Noted   Cervical radiculopathy 04/09/2019   Cervical pain (neck) 11/20/2019   Past Medical History:  Diagnosis Date   Chronic kidney disease    Hypertension      Review of Systems See HPI.     Objective:   Physical Exam    .. Results for orders placed or performed in visit on 12/23/20  WET PREP FOR [place], YEAST, CLUE   Specimen: GYN  Result Value Ref Range   MICRO NUMBER: [id number]    Specimen Quality Adequate    SOURCE: NOT GIVEN    Status FINAL    RESULT      No Trichomonas vaginalis seen. No yeast seen No clue cells seen Epithelial Cells Present  POCT glycosylated hemoglobin (Hb A1C)  Result Value Ref Range   Hemoglobin A1C 5.8 (A) 4.0 - 5.6 %   HbA1c POC (<> result, manual entry)     HbA1c, POC (prediabetic range)     HbA1c, POC (controlled diabetic range)    POCT UA - Microalbumin  Result Value Ref Range   Microalbumin Ur, POC 30 mg/L   Creatinine, POC 300 mg/dL   Albumin/Creatinine Ratio, Urine, POC <30        Assessment & Plan:  Marland KitchenMarland KitchenMacaila was seen today for diabetes.  Diagnoses and all orders for this visit:  Type 2 diabetes mellitus with stage 3a chronic kidney disease, without long-term current use of insulin (HCC) -     Semaglutide, 1 MG/DOSE, (OZEMPIC, 1 MG/DOSE,) 4 MG/3ML SOPN; INJECT 1 MG INTO THE SKIN ONCE WEEKLY -     POCT glycosylated hemoglobin (Hb A1C) -     POCT UA - Microalbumin  Hidradenitis suppurativa -     clindamycin (CLINDAGEL) 1 % gel; Apply topically 2 (two) times daily.  Class 1 obesity due to excess calories with serious comorbidity and body mass index (BMI) of 34.0 to 34.9 in adult -     phentermine 15 MG capsule; Take 1 capsule (15 mg total) by mouth every morning. -      topiramate (TOPAMAX) 25 MG tablet; Take 1 tablet (25 mg total) by mouth 2 (two) times daily.  Moderate recurrent major depression (HCC) -     escitalopram (LEXAPRO) 20 MG tablet; Take 1 tablet (20 mg total) by mouth daily.  Vaginal discharge -     fluconazole (DIFLUCAN) 150 MG tablet; Take 1 tablet (150 mg total) by mouth daily. Repeat in 48-72 hours if symptoms persist x 2. -     WET PREP FOR TRICH, YEAST, CLUE   A1C to goal.  Continue same medications.  No protein in urine.  BP to goal. On ACE. On statin.  Needs eye exam.  Pneumonia vaccine UTD.  Covid  vaccine x4.  Needs flu shot.  Follow up in 3 months.   Diflucan given for yeast infection after abx use. Wet prep ordered.   Discussed HS. Given topical clindamycin to use as needed. Clean with hibclens once a month. Follow up with dermatology.

## 2020-12-24 ENCOUNTER — Ambulatory Visit (INDEPENDENT_AMBULATORY_CARE_PROVIDER_SITE_OTHER): Payer: 59 | Admitting: Psychology

## 2020-12-24 DIAGNOSIS — F4321 Adjustment disorder with depressed mood: Secondary | ICD-10-CM | POA: Diagnosis not present

## 2020-12-29 ENCOUNTER — Other Ambulatory Visit: Payer: Self-pay | Admitting: Physician Assistant

## 2020-12-29 DIAGNOSIS — E782 Mixed hyperlipidemia: Secondary | ICD-10-CM

## 2020-12-31 ENCOUNTER — Encounter: Payer: Self-pay | Admitting: Physician Assistant

## 2021-01-12 ENCOUNTER — Ambulatory Visit: Payer: 59 | Admitting: Psychology

## 2021-01-20 ENCOUNTER — Encounter: Payer: Self-pay | Admitting: Physician Assistant

## 2021-01-20 DIAGNOSIS — L732 Hidradenitis suppurativa: Secondary | ICD-10-CM

## 2021-01-20 DIAGNOSIS — L0292 Furuncle, unspecified: Secondary | ICD-10-CM

## 2021-01-20 MED ORDER — DOXYCYCLINE HYCLATE 100 MG PO TABS: 100.0000 mg | ORAL_TABLET | Freq: Two times a day (BID) | ORAL | 0 refills | Status: DC

## 2021-01-20 NOTE — Telephone Encounter (Signed)
Hx of hidradenitis  suppurativa with new boil. Sent doxycycline. Warm compresses. Follow up in office this week.

## 2021-03-09 ENCOUNTER — Other Ambulatory Visit: Payer: Self-pay | Admitting: Neurology

## 2021-03-09 ENCOUNTER — Other Ambulatory Visit: Payer: Self-pay | Admitting: Physician Assistant

## 2021-03-09 DIAGNOSIS — N951 Menopausal and female climacteric states: Secondary | ICD-10-CM

## 2021-03-09 DIAGNOSIS — M5030 Other cervical disc degeneration, unspecified cervical region: Secondary | ICD-10-CM

## 2021-03-09 DIAGNOSIS — F331 Major depressive disorder, recurrent, moderate: Secondary | ICD-10-CM

## 2021-03-11 ENCOUNTER — Other Ambulatory Visit: Payer: Self-pay

## 2021-03-11 ENCOUNTER — Ambulatory Visit (INDEPENDENT_AMBULATORY_CARE_PROVIDER_SITE_OTHER): Payer: 59 | Admitting: Sports Medicine

## 2021-03-11 DIAGNOSIS — M5030 Other cervical disc degeneration, unspecified cervical region: Secondary | ICD-10-CM | POA: Diagnosis not present

## 2021-03-11 DIAGNOSIS — H8113 Benign paroxysmal vertigo, bilateral: Secondary | ICD-10-CM

## 2021-03-11 NOTE — Progress Notes (Signed)
    Procedures performed today:    None.  Independent interpretation of notes and tests performed by another provider:   None.  Brief History, Exam, Impression, and Recommendations:    DDD (degenerative disc disease), cervical This is a very pleasant 55 year old female, we have treated her for neck pain for some time now, she ultimately had a cervical epidural that provided good relief. This was about 4 to 5 months ago, she is having recurrence of axial neck pain with left-sided radicular symptoms, repeating a left C6-C7 interlaminar epidural. Of note she did have an area of spinal cord edema below the area of spinal stenosis. She was sent to neurology for concern of transverse myelitis, ultimately lumbar puncture and oligoclonal band testing was negative. Repeat MRI showed near resolution of the T2 hyperintensity. I would like to see her 1 month after her epidural injection.  Benign paroxysmal positional vertigo due to bilateral vestibular disorder Nina Trujillo is continuing to complain of vertiginous symptoms such as the room spinning when changing position of her head, no auditory symptoms. We will go ahead and set her up with vestibular physical therapy and she can follow this up with her PCP.    ___________________________________________ Gwen Her. Dianah Field, M.D., ABFM., CAQSM. Primary Care and [place] Instructor of [place] [facility] of Medicine

## 2021-03-11 NOTE — Assessment & Plan Note (Signed)
Nina Trujillo is continuing to complain of vertiginous symptoms such as the room spinning when changing position of her head, no auditory symptoms. We will go ahead and set her up with vestibular physical therapy and she can follow this up with her PCP.

## 2021-03-11 NOTE — Assessment & Plan Note (Signed)
This is a very pleasant 55 year old female, we have treated her for neck pain for some time now, she ultimately had a cervical epidural that provided good relief. This was about 4 to 5 months ago, she is having recurrence of axial neck pain with left-sided radicular symptoms, repeating a left C6-C7 interlaminar epidural. Of note she did have an area of spinal cord edema below the area of spinal stenosis. She was sent to neurology for concern of transverse myelitis, ultimately lumbar puncture and oligoclonal band testing was negative. Repeat MRI showed near resolution of the T2 hyperintensity. I would like to see her 1 month after her epidural injection.

## 2021-03-19 ENCOUNTER — Other Ambulatory Visit: Payer: Self-pay

## 2021-03-19 ENCOUNTER — Encounter: Payer: Self-pay | Admitting: Physical Therapy

## 2021-03-19 ENCOUNTER — Ambulatory Visit (INDEPENDENT_AMBULATORY_CARE_PROVIDER_SITE_OTHER): Payer: 59 | Admitting: Physical Therapy

## 2021-03-19 DIAGNOSIS — M542 Cervicalgia: Secondary | ICD-10-CM

## 2021-03-19 DIAGNOSIS — R293 Abnormal posture: Secondary | ICD-10-CM

## 2021-03-19 DIAGNOSIS — R42 Dizziness and giddiness: Secondary | ICD-10-CM | POA: Diagnosis not present

## 2021-03-19 DIAGNOSIS — M6281 Muscle weakness (generalized): Secondary | ICD-10-CM | POA: Diagnosis not present

## 2021-03-19 NOTE — Patient Instructions (Signed)
Access Code: [email or web address] URL: [email or web address] Date: 03/19/2021 Prepared by: Estill Bamberg April Thurnell Garbe  Exercises Seated Upper Trapezius Stretch - 1 x daily - 7 x weekly - 3 sets - 30 sec hold Seated Levator Scapulae Stretch - 1 x daily - 7 x weekly - 3 sets - 30 sec hold  Patient Education Trigger Point Dry Needling

## 2021-03-19 NOTE — Therapy (Signed)
[place], [place], [address] Phone: [phone]   Fax:  [phone]  Physical Therapy Evaluation  Patient Details  Name: Nina Trujillo MRN: [id number] Date of Birth: June 17, 1965 Referring Provider (PT): Silverio Decamp, MD   Encounter Date: 03/19/2021   PT End of Session - 03/19/21 1029     Visit Number 1    Number of Visits 6    Date for PT Re-Evaluation 04/30/21    Authorization Type [employer]    PT Start Time 1016    PT Stop Time 1100    PT Time Calculation (min) 44 min    Activity Tolerance Patient tolerated treatment well    Behavior During Therapy [facility] for tasks assessed/performed             Past Medical History:  Diagnosis Date   Chronic kidney disease    stage 3    Depression    Diabetes mellitus without complication ([place])    Hypertension     Past Surgical History:  Procedure Laterality Date   ABDOMINAL HYSTERECTOMY     both uterus and ovaries removed.    COLONOSCOPY  10 + years ago    IN [facility]-normal exam per pt    There were no vitals filed for this visit.    Subjective Assessment - 03/19/21 1019     Subjective Pt reports achiness in her neck ongoing for a few weeks. Pt states she's been numbness in her hands (mostly in her R arm) at night while sleeping (it will keep her up at night). Pt notes that carrying things will fall out of her hand.    Limitations Sitting;Standing;Walking    How long can you sit comfortably? ~10-15 minutes    How long can you stand comfortably? ~10 minutes    Patient Stated Goals Improve on achiness and dizziness    Currently in Pain? Yes    Pain Score 8     Pain Location Neck    Pain Orientation Mid    Pain Descriptors / Indicators Aching    Pain Type Chronic pain    Aggravating Factors  Sleeping/laying down    Pain Relieving Factors Rest, stretch                [facility] PT Assessment - 03/19/21 0001        Assessment   Medical Diagnosis BPPV and neck pain    Referring Provider (PT) Silverio Decamp, MD    Onset Date/Surgical Date --   Has worsened in last 3 weeks   Hand Dominance Right    Prior Therapy Yes, upper back (@ [facility])      Precautions   Precautions None      Restrictions   Weight Bearing Restrictions No      Balance Screen   Has the patient fallen in the past 6 months No      [place] residence    Living Arrangements Children    Available Help at Discharge Family      Observation/Other Assessments   Focus on Therapeutic Outcomes (FOTO)  n/a      Sensation   Light Touch Impaired by gross assessment   Reports increased sensation on R vs L UE (notably along C6, C7 dermatomes)     Posture/Postural Control   Posture/Postural Control Postural limitations    Postural Limitations Rounded Shoulders;Increased thoracic kyphosis      ROM /  Strength   AROM / PROM / Strength AROM;Strength      AROM   AROM Assessment Site Cervical    Cervical Flexion 30    Cervical Extension 40    Cervical - Right Side Bend 36    Cervical - Left Side Bend 30    Cervical - Right Rotation 60    Cervical - Left Rotation 70      Strength   Overall Strength Comments Strength testing in Y and T positioning in prone bilat 3/5 -- increased upper trap compensation noted immediately during R UE "Y"; wrist ext and flexion bilat 4-/5    Strength Assessment Site Cervical;Shoulder;Elbow    Right/Left Shoulder Right;Left    Right Shoulder Flexion 5/5    Right Shoulder ABduction 5/5    Right Shoulder Internal Rotation 4+/5    Right Shoulder External Rotation 4+/5    Left Shoulder Flexion 5/5    Left Shoulder ABduction 5/5    Left Shoulder Internal Rotation 4+/5    Left Shoulder External Rotation 4+/5    Right/Left Elbow Right;Left    Right Elbow Flexion 4-/5    Right Elbow Extension 4-/5    Left Elbow Flexion 4-/5    Left Elbow Extension 4-/5       Palpation   Spinal mobility Increased symptoms with PA mob of C4-5, C5-6    Palpation comment Palpable tautness and trigger points along bilat UTs, suboccipitals, levators, pecs and periscapular muscles      Special Tests    Special Tests Cervical    Cervical Tests Dictraction;Vertebral Artery Test      Distraction Test   Findngs Positive      Vertebral Artery Test    Findings Negative                    Vestibular Assessment - 03/19/21 0001       Symptom Behavior   Subjective history of current problem "It may happen today, it may happen tomorrow"    Type of Dizziness  "World moves"   "uneven" "shifting"   Frequency of Dizziness 6 times in the last month    Duration of Dizziness Lasts ~3-4 min    Symptom Nature Spontaneous    Aggravating Factors Rolling to right;Rolling to left;Forward bending    Relieving Factors Head stationary   "be still"   Progression of Symptoms --   Happening more frequently in the last month or so   History of similar episodes Pt has had this in the past      Oculomotor Exam   Oculomotor Alignment Normal    Ocular ROM WFL    Spontaneous Absent    Gaze-induced  Right beating nystagmus with L gaze    Smooth Pursuits Intact    Saccades Intact      Oculomotor Exam-Fixation Suppressed    Left Head Impulse WFL    Right Head Impulse Corrective saccades with attempting to maintain L gaze      Vestibulo-Ocular Reflex   VOR 1 Head Only (x 1 viewing) Symptomatic    VOR to Slow Head Movement Normal    VOR Cancellation Normal      Positional Testing   Sidelying Test Sidelying Right;Sidelying Left    Horizontal Canal Testing Horizontal Canal Right;Horizontal Canal Left      Sidelying Right   Sidelying Right Duration 0    Sidelying Right Symptoms No nystagmus   felt "increase in pressure"     Sidelying Left  Sidelying Left Duration 0    Sidelying Left Symptoms No nystagmus   felt "increase in pressure"     Horizontal Canal Right    Horizontal Canal Right Duration 0      Horizontal Canal Left   Horizontal Canal Left Duration 0                Objective measurements completed on examination: See above findings.                PT Education - 03/19/21 1112     Education Details Discussed exam findings, POC, HEP and cervicogenic dizziness. Discussed self massage with tennis ball and TPDN.    Person(s) Educated Patient    Methods Explanation;Handout;Verbal cues    Comprehension Verbalized understanding;Returned demonstration;Verbal cues required;Tactile cues required                 PT Long Term Goals - 03/19/21 1237       PT LONG TERM GOAL #1   Title Pt will be independent with HEP    Time 6    Period Weeks    Status New    Target Date 04/30/21      PT LONG TERM GOAL #2   Title Pt will report 50% improvement in her dizzy symptoms    Time 6    Period Weeks    Status New    Target Date 04/30/21      PT LONG TERM GOAL #3   Title Pt will report at least 50% improvement in her neck pain/ache for improved lifting and holding 5#    Time 6    Period Weeks    Status New    Target Date 04/30/21      PT LONG TERM GOAL #4   Title Pt will be able to demo improved cervical ROM by at least 10 deg for side bending and rotation    Time 6    Period Weeks    Status New    Target Date 04/30/21                    Plan - 03/19/21 1211     Clinical Impression Statement Ms. Nina Trujillo is a 55 y/o F presenting to OPPT due to complaint of constant neck aching with radiating symptoms down arms and increasing dizzy spells. Has seen neurology for concern of transverse myelitis, ultimately lumbar puncture and oligoclonal band testing was negative. Vestibular assessment is negative for BPPV or any central vestibulopathy; s/s appear most consistent with cervicogenic dizziness/motion sensitivity with decreased ability to maintain stable gaze during quick head rotations. Pt has had prior PT  for her neck pain. On assessment she demos increased upper trap, levator, pec and periscapular tightness/trigger points with decreased cervical ROM (mostly side bending and rotation), increased symptoms with C4-6 extension, and weak low/mid trap. Pt would highly benefit from PT to address her mobility deficits for improved carrying/lifting and comfort with her home and community tasks.    Personal Factors and Comorbidities Age;Time since onset of injury/illness/exacerbation    Examination-Activity Limitations Carry;Lift;Stand;Sleep;Sit    Examination-Participation Restrictions Shop;Cleaning;Meal Prep    Stability/Clinical Decision Making Evolving/Moderate complexity    Clinical Decision Making Moderate    Rehab Potential Fair    PT Frequency 1x / week   Rec 2x/wk but due to copay can only perform 1x/wk   PT Duration 6 weeks    PT Treatment/Interventions ADLs/Self Care Home Management;Aquatic Therapy;Cryotherapy;Electrical Stimulation;Iontophoresis 4mg /ml Dexamethasone;Moist Heat;Traction;Neuromuscular re-education;Therapeutic  exercise;Therapeutic activities;Functional mobility training;Patient/family education;Manual techniques;Passive range of motion;Dry needling;Taping;Vestibular    PT Next Visit Plan Please consider TPDN upper trap, levators, pecs and periscapular musculature. Work on strengthening low/mid trap and c-spine. May need to check grip strength (didn't have time this session) as pt has been report dropping items. May also consider cervical traction (was not attempted on her previous PT episode)    PT Home Exercise Plan Self massage with tennis ball; Access Code [email or web address]    Consulted and Agree with Plan of Care Patient             Patient will benefit from skilled therapeutic intervention in order to improve the following deficits and impairments:  Increased fascial restricitons, Decreased range of motion, Dizziness, Decreased activity tolerance, Pain, Impaired UE functional use,  Increased muscle spasms, Decreased mobility, Decreased strength, Impaired sensation, Postural dysfunction, Improper body mechanics  Visit Diagnosis: Cervicalgia  Abnormal posture  Muscle weakness (generalized)  Dizziness and giddiness     Problem List Patient Active Problem List   Diagnosis Date Noted   Moderate recurrent major depression ([place]) 11/29/2020   Abnormal MRI, cervical spine 04/30/2020   Abscess of right axilla 04/18/2020   White matter abnormality on MRI of brain 01/15/2020   Numbness 01/15/2020   Demyelinating disease of central nervous system ([place]) 11/26/2019   Carpal tunnel syndrome, bilateral 11/23/2019   Bloating 11/20/2019   Epigastric pain 11/20/2019   Type 2 diabetes mellitus with chronic kidney disease, without long-term current use of insulin ([place]) 06/25/2019   Acute pain of both knees 06/25/2019   DDD (degenerative disc disease), cervical 04/04/2019   Mixed hyperlipidemia 05/22/2018   Dyslipidemia, goal LDL below 70 05/22/2018   Upper back pain 05/19/2018   Benign paroxysmal positional vertigo due to bilateral vestibular disorder 05/19/2018   Allergic rhinitis due to allergen 08/08/2017   Microalbuminuria due to type 2 diabetes mellitus ([place]) 08/25/2015   Class 1 obesity due to excess calories with serious comorbidity and body mass index (BMI) of 34.0 to 34.9 in adult 03/21/2015   Depression 03/21/2015   CKD (chronic kidney disease) stage 3, GFR 30-59 ml/min (HCC) 12/16/2014   Hidradenitis suppurativa 12/13/2014   Tobacco dependence 05/31/2014   Abnormal weight gain 05/31/2014   Obese 05/31/2014   Essential hypertension, benign 05/31/2014   Diabetes mellitus without complication ([place]) 24/81/8590   Menopausal symptoms 05/31/2014    [facility] April Gordy Levan, PT, DPT 03/19/2021, 12:57 PM  [facility] [place] [address] [place], [place], [address] Phone: [phone]   Fax:  [phone]  Name:  Nina Trujillo MRN: [id number] Date of Birth: 1965/12/23

## 2021-03-25 ENCOUNTER — Ambulatory Visit (INDEPENDENT_AMBULATORY_CARE_PROVIDER_SITE_OTHER): Payer: 59 | Admitting: Physician Assistant

## 2021-03-25 ENCOUNTER — Encounter: Payer: Self-pay | Admitting: Physician Assistant

## 2021-03-25 ENCOUNTER — Other Ambulatory Visit: Payer: Self-pay

## 2021-03-25 VITALS — BP 130/60 | HR 90 | Ht 66.0 in | Wt 205.0 lb

## 2021-03-25 DIAGNOSIS — G2581 Restless legs syndrome: Secondary | ICD-10-CM

## 2021-03-25 DIAGNOSIS — E6609 Other obesity due to excess calories: Secondary | ICD-10-CM | POA: Diagnosis not present

## 2021-03-25 DIAGNOSIS — N1831 Chronic kidney disease, stage 3a: Secondary | ICD-10-CM | POA: Diagnosis not present

## 2021-03-25 DIAGNOSIS — Z23 Encounter for immunization: Secondary | ICD-10-CM | POA: Diagnosis not present

## 2021-03-25 DIAGNOSIS — I10 Essential (primary) hypertension: Secondary | ICD-10-CM | POA: Diagnosis not present

## 2021-03-25 DIAGNOSIS — Z1329 Encounter for screening for other suspected endocrine disorder: Secondary | ICD-10-CM

## 2021-03-25 DIAGNOSIS — Z6834 Body mass index (BMI) 34.0-34.9, adult: Secondary | ICD-10-CM

## 2021-03-25 DIAGNOSIS — E1122 Type 2 diabetes mellitus with diabetic chronic kidney disease: Secondary | ICD-10-CM

## 2021-03-25 LAB — POCT GLYCOSYLATED HEMOGLOBIN (HGB A1C): Hemoglobin A1C: 5.5 % (ref 4.0–5.6)

## 2021-03-25 NOTE — Patient Instructions (Addendum)
Decrease tresbia 10 units.  Increase ozempic to 2mg  weekly.  Increase gabapentin 2 tablets at bedtime  Restless Legs Syndrome Restless legs syndrome is a condition that causes uncomfortable feelings or sensations in the legs, especially while sitting or lying down. The sensations usually cause an overwhelming urge to move the legs. The arms can also sometimes be affected. The condition can range from mild to severe. The symptoms often interfere with a person's ability to sleep. What are the causes? The cause of this condition is not known. What increases the risk? The following factors may make you more likely to develop this condition: Being older than 50. Pregnancy. Being a woman. In general, the condition is more common in women than in men. A family history of the condition. Having iron deficiency. Overuse of caffeine, nicotine, or alcohol. Certain medical conditions, such as kidney disease, Parkinson's disease, or nerve damage. Certain medicines, such as those for high blood pressure, nausea, colds, allergies, depression, and some heart conditions. What are the signs or symptoms? The main symptom of this condition is uncomfortable sensations in the legs, such as: Pulling. Tingling. Prickling. Throbbing. Crawling. Burning. Usually, the sensations: Affect both sides of the body. Are worse when you sit or lie down. Are worse at night. These may make it difficult to fall asleep. Make you have a strong urge to move your legs. Are temporarily relieved by moving your legs or standing. The arms can also be affected, but this is rare. People who have this condition often have tiredness during the day because of their lack of sleep at night. How is this diagnosed? This condition may be diagnosed based on: Your symptoms. Blood tests. In some cases, you may be monitored in a sleep lab by a specialist (a sleep study). This can detect any disruptions in your sleep. How is this  treated? This condition is treated by managing the symptoms. This may include: Lifestyle changes, such as exercising, using relaxation techniques, and avoiding caffeine, alcohol, or tobacco. Iron supplements. Medicines. Parkinson's medications may be tried first. Anti-seizure medications can also be helpful. Follow these instructions at home: General instructions Take over-the-counter and prescription medicines only as told by your health care provider. Use methods to help relieve the uncomfortable sensations, such as: Massaging your legs. Walking or stretching. Taking a cold or hot bath. Keep all follow-up visits. This is important. Lifestyle   Practice good sleep habits. For example, go to bed and get up at the same time every day. Most adults should get 7-9 hours of sleep each night. Exercise regularly. Try to get at least 30 minutes of exercise most days of the week. Practice ways of relaxing, such as yoga or meditation. Avoid caffeine and alcohol. Do not use any products that contain nicotine or tobacco. These products include cigarettes, chewing tobacco, and vaping devices, such as e-cigarettes. If you need help quitting, ask your health care provider. Where to find more information [employer] of Neurological Disorders and Stroke: [employer]it Contact a health care provider if: Your symptoms get worse or they do not improve with treatment. Summary Restless legs syndrome is a condition that causes uncomfortable feelings or sensations in the legs, especially while sitting or lying down. The symptoms often interfere with your ability to sleep. This condition is treated by managing the symptoms. You may need to make lifestyle changes or take medicines. This information is not intended to replace advice given to you by your health care provider. Make sure you discuss any questions  you have with your health care provider. Document Revised: 12/14/2020 Document Reviewed:  12/14/2020 Elsevier Patient Education  [place].

## 2021-03-25 NOTE — Progress Notes (Signed)
Subjective:    Patient ID: Nina Trujillo, female    DOB: 10-29-1965, 55 y.o.   MRN: [id number]  HPI Pt is a 55 yo female with T2DM, HTN, HLD, smoker who presents to the clinic for 3 month follow up.   She is taking tresbia, ozempic, metformin and morning sugars ranging around 92. No CP, palpitations, headaches or vision changes. She is having some night time leg achy and jumpy feeling. Continues to smoke.   Continues to try to stay active and eat right. On phentermine and tomamax.     .. Active Ambulatory Problems    Diagnosis Date Noted   Tobacco dependence 05/31/2014   Abnormal weight gain 05/31/2014   Obese 05/31/2014   Essential hypertension, benign 05/31/2014   Diabetes mellitus without complication ([place]) 86/76/7209   Menopausal symptoms 05/31/2014   Hidradenitis suppurativa 12/13/2014   CKD (chronic kidney disease) stage 3, GFR 30-59 ml/min (HCC) 12/16/2014   Class 1 obesity due to excess calories with serious comorbidity and body mass index (BMI) of 34.0 to 34.9 in adult 03/21/2015   Depression 03/21/2015   Microalbuminuria due to type 2 diabetes mellitus ([place]) 08/25/2015   Allergic rhinitis due to allergen 08/08/2017   Upper back pain 05/19/2018   Benign paroxysmal positional vertigo due to bilateral vestibular disorder 05/19/2018   Mixed hyperlipidemia 05/22/2018   Dyslipidemia, goal LDL below 70 05/22/2018   DDD (degenerative disc disease), cervical 04/04/2019   Type 2 diabetes mellitus with chronic kidney disease, without long-term current use of insulin ([place]) 06/25/2019   Acute pain of both knees 06/25/2019   Bloating 11/20/2019   Epigastric pain 11/20/2019   Carpal tunnel syndrome, bilateral 11/23/2019   Demyelinating disease of central nervous system ([place]) 11/26/2019   White matter abnormality on MRI of brain 01/15/2020   Numbness 01/15/2020   Abscess of right axilla 04/18/2020   Abnormal MRI, cervical spine 04/30/2020   Moderate recurrent major  depression (HCC) 11/29/2020   RLS (restless legs syndrome) 03/26/2021   Resolved Ambulatory Problems    Diagnosis Date Noted   Cervical radiculopathy 04/09/2019   Cervical pain (neck) 11/20/2019   Past Medical History:  Diagnosis Date   Chronic kidney disease    Hypertension      Review of Systems See HPI.     Objective:   Physical Exam Vitals reviewed.  Constitutional:      Appearance: Normal appearance.  HENT:     Head: Normocephalic.     Right Ear: Tympanic membrane normal.     Left Ear: Tympanic membrane normal.     Nose: Nose normal.  Cardiovascular:     Rate and Rhythm: Normal rate and regular rhythm.     Pulses: Normal pulses.     Heart sounds: Murmur heard.  Pulmonary:     Effort: Pulmonary effort is normal.     Breath sounds: Normal breath sounds.  Neurological:     General: No focal deficit present.     Mental Status: She is alert and oriented to person, place, and time.  Psychiatric:        Mood and Affect: Mood normal.      .. Results for orders placed or performed in visit on 03/25/21  TSH  Result Value Ref Range   TSH 1.89 0.40 - 4.50 mIU/L  COMPLETE METABOLIC PANEL WITH GFR  Result Value Ref Range   Glucose, Bld 115 (H) 65 - 99 mg/dL   BUN 15 7 - 25 mg/dL   Creat 1.21 (  H) 0.50 - 1.03 mg/dL   eGFR 52 (L) > OR = 60 mL/min/1.83m   BUN/Creatinine Ratio 12 6 - 22 (calc)   Sodium 143 135 - 146 mmol/L   Potassium 4.4 3.5 - 5.3 mmol/L   Chloride 106 98 - 110 mmol/L   CO2 25 20 - 32 mmol/L   Calcium 10.1 8.6 - 10.4 mg/dL   Total Protein 6.8 6.1 - 8.1 g/dL   Albumin 4.2 3.6 - 5.1 g/dL   Globulin 2.6 1.9 - 3.7 g/dL (calc)   AG Ratio 1.6 1.0 - 2.5 (calc)   Total Bilirubin 0.3 0.2 - 1.2 mg/dL   Alkaline phosphatase (APISO) 75 37 - 153 U/L   AST 18 10 - 35 U/L   ALT 14 6 - 29 U/L  POCT glycosylated hemoglobin (Hb A1C)  Result Value Ref Range   Hemoglobin A1C 5.5 4.0 - 5.6 %   HbA1c POC (<> result, manual entry)     HbA1c, POC (prediabetic  range)     HbA1c, POC (controlled diabetic range)         Assessment & Plan:  .Marland KitchenMarland KitchenFernewas seen today for diabetes.  Diagnoses and all orders for this visit:  Type 2 diabetes mellitus with stage 3a chronic kidney disease, without long-term current use of insulin (HCC) -     POCT glycosylated hemoglobin (Hb A1C) -     Semaglutide, 2 MG/DOSE, 8 MG/3ML SOPN; Inject 2 mg as directed once a week. -     COMPLETE METABOLIC PANEL WITH GFR  Needs flu shot -     Flu Vaccine QUAD 694moM (Fluarix, Fluzone & Alfiuria Quad PF)  Need for pneumococcal vaccination -     Pneumococcal conjugate vaccine 20-valent (Prevnar 20)  Class 1 obesity due to excess calories with serious comorbidity and body mass index (BMI) of 34.0 to 34.9 in adult -     Semaglutide, 2 MG/DOSE, 8 MG/3ML SOPN; Inject 2 mg as directed once a week. -     phentermine 15 MG capsule; Take 1 capsule (15 mg total) by mouth every morning.  Essential hypertension, benign -     hydrochlorothiazide (HYDRODIURIL) 25 MG tablet; Take 1 tablet (25 mg total) by mouth at bedtime. TAKE 1 TABLET BY MOUTH DAILY. -     COMPLETE METABOLIC PANEL WITH GFR  Thyroid disorder screen -     TSH  A1C is to goal.  Continue on same medications.  Increased ozempic to 90m20meekly.  BP to goal.  On statin. Recheck lipid today.  Needs eye exam.  Covid x4.  Flu shot UTD.  Pneumonia shot UTD.  Needs shingles.  Follow up in 3 months.   ..DMarland Kitchenscussed low carb diet with 1500 calories and 80g of protein.  Exercising at least 150 minutes a week.  My Fitness Pal could be a greMicrobiologistContinue phentermine 43m63mr weight management.   RLS increase gabapentin to 2 tablets at bedtime.

## 2021-03-26 ENCOUNTER — Ambulatory Visit (INDEPENDENT_AMBULATORY_CARE_PROVIDER_SITE_OTHER): Payer: 59 | Admitting: Rehabilitative and Restorative Service Providers"

## 2021-03-26 ENCOUNTER — Encounter: Payer: Self-pay | Admitting: Physician Assistant

## 2021-03-26 ENCOUNTER — Encounter: Payer: Self-pay | Admitting: Rehabilitative and Restorative Service Providers"

## 2021-03-26 DIAGNOSIS — R42 Dizziness and giddiness: Secondary | ICD-10-CM

## 2021-03-26 DIAGNOSIS — M542 Cervicalgia: Secondary | ICD-10-CM | POA: Diagnosis not present

## 2021-03-26 DIAGNOSIS — G2581 Restless legs syndrome: Secondary | ICD-10-CM | POA: Insufficient documentation

## 2021-03-26 DIAGNOSIS — R293 Abnormal posture: Secondary | ICD-10-CM

## 2021-03-26 DIAGNOSIS — M6281 Muscle weakness (generalized): Secondary | ICD-10-CM

## 2021-03-26 LAB — COMPLETE METABOLIC PANEL WITH GFR
AG Ratio: 1.6 (calc) (ref 1.0–2.5)
ALT: 14 U/L (ref 6–29)
AST: 18 U/L (ref 10–35)
Albumin: 4.2 g/dL (ref 3.6–5.1)
Alkaline phosphatase (APISO): 75 U/L (ref 37–153)
BUN/Creatinine Ratio: 12 (calc) (ref 6–22)
BUN: 15 mg/dL (ref 7–25)
CO2: 25 mmol/L (ref 20–32)
Calcium: 10.1 mg/dL (ref 8.6–10.4)
Chloride: 106 mmol/L (ref 98–110)
Creat: 1.21 mg/dL — ABNORMAL HIGH (ref 0.50–1.03)
Globulin: 2.6 g/dL (calc) (ref 1.9–3.7)
Glucose, Bld: 115 mg/dL — ABNORMAL HIGH (ref 65–99)
Potassium: 4.4 mmol/L (ref 3.5–5.3)
Sodium: 143 mmol/L (ref 135–146)
Total Bilirubin: 0.3 mg/dL (ref 0.2–1.2)
Total Protein: 6.8 g/dL (ref 6.1–8.1)
eGFR: 52 mL/min/{1.73_m2} — ABNORMAL LOW (ref >=60)

## 2021-03-26 LAB — LIPID PANEL W/REFLEX DIRECT LDL
Cholesterol: 122 mg/dL (ref <200)
HDL: 45 mg/dL — ABNORMAL LOW (ref >=50)
LDL Cholesterol (Calc): 59 mg/dL (calc)
Non-HDL Cholesterol (Calc): 77 mg/dL (calc) (ref <130)
Total CHOL/HDL Ratio: 2.7 (calc) (ref <5.0)
Triglycerides: 97 mg/dL (ref <150)

## 2021-03-26 LAB — TSH: TSH: 1.89 mIU/L (ref 0.40–4.50)

## 2021-03-26 MED ORDER — PHENTERMINE HCL 15 MG PO CAPS
15.0000 mg | ORAL_CAPSULE | ORAL | 0 refills | Status: DC
Start: 2021-03-26 — End: 2021-07-07

## 2021-03-26 MED ORDER — SEMAGLUTIDE (2 MG/DOSE) 8 MG/3ML ~~LOC~~ SOPN: 2.0000 mg | PEN_INJECTOR | SUBCUTANEOUS | 2 refills | Status: DC

## 2021-03-26 MED ORDER — HYDROCHLOROTHIAZIDE 25 MG PO TABS: 25.0000 mg | ORAL_TABLET | Freq: Every day | ORAL | 1 refills | Status: DC

## 2021-03-26 NOTE — Progress Notes (Signed)
Thyroid looks great.

## 2021-03-26 NOTE — Therapy (Signed)
[facility] [place], [place], [address] Phone: [phone]   Fax:  [phone]  Physical Therapy Treatment  Patient Details  Name: Nina Trujillo MRN: [id number] Date of Birth: March 21, 1966 Referring Provider (PT): Silverio Decamp, MD   Encounter Date: 03/26/2021   PT End of Session - 03/26/21 1115     Visit Number 2    Number of Visits 6    Date for PT Re-Evaluation 04/30/21    Authorization Type [employer]    PT Start Time 1110    PT Stop Time 2774    PT Time Calculation (min) 48 min    Activity Tolerance Patient tolerated treatment well             Past Medical History:  Diagnosis Date   Chronic kidney disease    stage 3    Depression    Diabetes mellitus without complication ([place])    Hypertension     Past Surgical History:  Procedure Laterality Date   ABDOMINAL HYSTERECTOMY     both uterus and ovaries removed.    COLONOSCOPY  10 + years ago    IN [facility]-normal exam per pt    There were no vitals filed for this visit.   Subjective Assessment - 03/26/21 1113     Subjective Had flu shot yesterday. She has pain in neck and both shoulders anyway but the Lt shoulder where she got injection is sore. She has been working with the tennis ball and doing some stretching at home.    Currently in Pain? Yes    Pain Score 5     Pain Location Neck    Pain Orientation Mid    Pain Descriptors / Indicators Aching                               [facility] Adult PT Treatment/Exercise - 03/26/21 0001       Self-Care   Self-Care Posture    Posture initated postural correction - chest lift      Neck Exercises: Stretches   Upper Trapezius Stretch Right;Left;3 reps;10 seconds    Upper Trapezius Stretch Limitations seated correcting technique      Shoulder Exercises: Standing   Other Standing Exercises axial extension 5 sec x 5 reps; scap squeeze 5 sec x 5 reps with  swim noodle      Shoulder Exercises: Stretch   Other Shoulder Stretches doorway stretch lower position 30 sec x 3 reps      Modalities   Modalities Electrical Stimulation;Moist Heat      Moist Heat Therapy   Number Minutes Moist Heat 10 Minutes    Moist Heat Location Cervical   thoracic spine area     Electrical Stimulation   Electrical Stimulation Location bilat cervical and thoracic paraspinals    Electrical Stimulation Action TENS    Electrical Stimulation Parameters to tolerance    Electrical Stimulation Goals Pain;Tone      Manual Therapy   Manual therapy comments skilled palpation to assess response to DN and manual work    Soft tissue mobilization deep tissue work through the upper traps; posterior cervical musculature to medial scapular musculature bilat    Myofascial Release posterior cervical to thoracic spine              Trigger Point Dry Needling - 03/26/21 0001     Consent Given? Yes    Education Handout  Provided Previously provided    Muscles Treated Head and Neck Suboccipitals;Cervical multifidi    Dry Needling Comments bilat    Suboccipitals Response Palpable increased muscle length    Cervical multifidi Response Palpable increased muscle length                   PT Education - 03/26/21 1146     Education Details HEP; TENS; previously given DN handout    Person(s) Educated Patient    Methods Explanation;Demonstration;Tactile cues;Verbal cues;Handout    Comprehension Verbalized understanding;Returned demonstration;Verbal cues required;Tactile cues required                 PT Long Term Goals - 03/19/21 1237       PT LONG TERM GOAL #1   Title Pt will be independent with HEP    Time 6    Period Weeks    Status New    Target Date 04/30/21      PT LONG TERM GOAL #2   Title Pt will report 50% improvement in her dizzy symptoms    Time 6    Period Weeks    Status New    Target Date 04/30/21      PT LONG TERM GOAL #3   Title Pt  will report at least 50% improvement in her neck pain/ache for improved lifting and holding 5#    Time 6    Period Weeks    Status New    Target Date 04/30/21      PT LONG TERM GOAL #4   Title Pt will be able to demo improved cervical ROM by at least 10 deg for side bending and rotation    Time 6    Period Weeks    Status New    Target Date 04/30/21                   Plan - 03/26/21 1116     Clinical Impression Statement Patient had difficulty performing cervical stretching correctly. Technique was corrected. Added postural correction, stretching for pecs, activation of posterior shoulder girdle for postural strengthening. Trial of DN and manual work tolerated well. Trial of TENS unit to assess for possible purchase for home. Palpable tightness in the cervical and thoracic musculature.    Rehab Potential Fair    PT Frequency 1x / week    PT Duration 6 weeks    PT Treatment/Interventions ADLs/Self Care Home Management;Aquatic Therapy;Cryotherapy;Electrical Stimulation;Iontophoresis 4mg /ml Dexamethasone;Moist Heat;Traction;Neuromuscular re-education;Therapeutic exercise;Therapeutic activities;Functional mobility training;Patient/family education;Manual techniques;Passive range of motion;Dry needling;Taping;Vestibular    PT Next Visit Plan continue with DN and manual work through upper trap, levators, pecs and periscapular musculature. Work on strengthening low/mid trap and c-spine. May need to check grip strength (didn't have time this session) as pt has been report dropping items. Assess response to trial of DN and manual work, TENS unit, exercises    PT Home Exercise Plan Self massage with tennis ball; Access Code [email or web address]    [id number] and Agree with Plan of Care Patient             Patient will benefit from skilled therapeutic intervention in order to improve the following deficits and impairments:     Visit Diagnosis: Cervicalgia  Abnormal posture  Muscle weakness  (generalized)  Dizziness and giddiness     Problem List Patient Active Problem List   Diagnosis Date Noted   Moderate recurrent major depression ([place]) 11/29/2020   Abnormal MRI, cervical spine 04/30/2020   Abscess  of right axilla 04/18/2020   White matter abnormality on MRI of brain 01/15/2020   Numbness 01/15/2020   Demyelinating disease of central nervous system ([place]) 11/26/2019   Carpal tunnel syndrome, bilateral 11/23/2019   Bloating 11/20/2019   Epigastric pain 11/20/2019   Type 2 diabetes mellitus with chronic kidney disease, without long-term current use of insulin ([place]) 06/25/2019   Acute pain of both knees 06/25/2019   DDD (degenerative disc disease), cervical 04/04/2019   Mixed hyperlipidemia 05/22/2018   Dyslipidemia, goal LDL below 70 05/22/2018   Upper back pain 05/19/2018   Benign paroxysmal positional vertigo due to bilateral vestibular disorder 05/19/2018   Allergic rhinitis due to allergen 08/08/2017   Microalbuminuria due to type 2 diabetes mellitus ([place]) 08/25/2015   Class 1 obesity due to excess calories with serious comorbidity and body mass index (BMI) of 34.0 to 34.9 in adult 03/21/2015   Depression 03/21/2015   CKD (chronic kidney disease) stage 3, GFR 30-59 ml/min ([facility]) 12/16/2014   Hidradenitis suppurativa 12/13/2014   Tobacco dependence 05/31/2014   Abnormal weight gain 05/31/2014   Obese 05/31/2014   Essential hypertension, benign 05/31/2014   Diabetes mellitus without complication ([place]) 78/29/5621   Menopausal symptoms 05/31/2014    Haylea Schlichting Nilda Simmer, PT, MPH  03/26/2021, 12:05 PM  [facility] [place], [place], [address] Phone: [phone]   Fax:  [phone]  Name: Nina Trujillo MRN: [id number] Date of Birth: [birth year]

## 2021-03-26 NOTE — Progress Notes (Signed)
Kidney function improved!

## 2021-03-26 NOTE — Progress Notes (Signed)
LDL to goal.  HDL, good cholesterol, decreasing smoking and exercising more can help get HDL up some.

## 2021-03-26 NOTE — Patient Instructions (Signed)
TENS UNIT: This is helpful for muscle pain and spasm.   Search and Purchase a TENS 7000 2nd edition at www.tenspros.com. It should be less than $30.     TENS unit instructions: Do not shower or bathe with the unit on Turn the unit off before removing electrodes or batteries If the electrodes lose stickiness add a drop of water to the electrodes after they are disconnected from the unit and place on plastic sheet. If you continued to have difficulty, call the TENS unit company to purchase more electrodes. Do not apply lotion on the skin area prior to use. Make sure the skin is clean and dry as this will help prolong the life of the electrodes. After use, always check skin for unusual red areas, rash or other skin difficulties. If there are any skin problems, does not apply electrodes to the same area. Never remove the electrodes from the unit by pulling the wires. Do not use the TENS unit or electrodes other than as directed. Do not change electrode placement without consultating your therapist or physician. Keep 2 fingers with between each electrode.   Access Code: [email or web address] URL: [email or web address] Date: 03/26/2021 Prepared by: Gillermo Murdoch  Exercises Seated Upper Trapezius Stretch - 1 x daily - 7 x weekly - 3 sets - 30 sec hold Seated Levator Scapulae Stretch - 1 x daily - 7 x weekly - 3 sets - 30 sec hold Doorway Pec Stretch at 60 Elevation - 3 x daily - 7 x weekly - 1 sets - 3 reps - 30 hold Seated Cervical Retraction - 2 x daily - 7 x weekly - 1-2 sets - 5-10 reps - 10 sec hold Seated Scapular Retraction - 2 x daily - 7 x weekly - 1 sets - 10 reps - 5-10 sec hold Standing Scapular Retraction - 2 x daily - 7 x weekly - 1 sets - 5-10 reps - 5-10 sec hold

## 2021-04-02 ENCOUNTER — Other Ambulatory Visit: Payer: Self-pay

## 2021-04-02 ENCOUNTER — Ambulatory Visit (INDEPENDENT_AMBULATORY_CARE_PROVIDER_SITE_OTHER): Payer: 59 | Admitting: Physical Therapy

## 2021-04-02 DIAGNOSIS — R42 Dizziness and giddiness: Secondary | ICD-10-CM

## 2021-04-02 DIAGNOSIS — R293 Abnormal posture: Secondary | ICD-10-CM | POA: Diagnosis not present

## 2021-04-02 DIAGNOSIS — M6281 Muscle weakness (generalized): Secondary | ICD-10-CM

## 2021-04-02 DIAGNOSIS — M542 Cervicalgia: Secondary | ICD-10-CM | POA: Diagnosis not present

## 2021-04-02 NOTE — Therapy (Signed)
[facility] [place], [place], [address] Phone: [phone]   Fax:  [phone]  Physical Therapy Treatment  Patient Details  Name: Nina Trujillo MRN: [id number] Date of Birth: 04/10/1966 Referring Provider (PT): Silverio Decamp, MD   Encounter Date: 04/02/2021   PT End of Session - 04/02/21 0923     Visit Number 3    Number of Visits 6    Date for PT Re-Evaluation 04/30/21    Authorization Type [employer]    PT Start Time [phone]    PT Stop Time 1015    PT Time Calculation (min) 50 min    Activity Tolerance Patient tolerated treatment well    Behavior During Therapy [facility] for tasks assessed/performed             Past Medical History:  Diagnosis Date   Chronic kidney disease    stage 3    Depression    Diabetes mellitus without complication ([place])    Hypertension     Past Surgical History:  Procedure Laterality Date   ABDOMINAL HYSTERECTOMY     both uterus and ovaries removed.    COLONOSCOPY  10 + years ago    IN [facility]-normal exam per pt    There were no vitals filed for this visit.   Subjective Assessment - 04/02/21 0926     Subjective Pt reports great relief after TPDN. Pt notes that tightness started to come back on Sunday. Pt reports 2 spells of dizziness this weekend but it has been decreasing overall. She notes that N/T in her arms is also decreasing but she's feeling it in her R LE > L LE -- not sure if this is because of MS    Limitations Sitting;Standing;Walking    How long can you sit comfortably? ~10-15 minutes    How long can you stand comfortably? ~10 minutes    Patient Stated Goals Improve on achiness and dizziness    Currently in Pain? Yes    Pain Score 5     Pain Location Neck    Pain Orientation Mid    Pain Descriptors / Indicators Aching    Pain Type Chronic pain                [facility] PT Assessment - 04/02/21 0001       AROM   Cervical -  Right Side Bend Almost able to get ear to shoulder    Cervical - Left Side Bend Almost able to get ear to shoulder    Cervical - Right Rotation [facility]    Cervical - Left Rotation [facility]                           [facility] Adult PT Treatment/Exercise - 04/02/21 0001       Neck Exercises: Supine   Neck Retraction 10 reps      Neck Exercises: Prone   Axial Exension 10 reps      Neck Exercises: Stretches   Upper Trapezius Stretch 30 seconds    Upper Trapezius Stretch Limitations supine    Other Neck Stretches rotation x10 R & L      Shoulder Exercises: Standing   Horizontal ABduction Strengthening;Both;20 reps;Theraband    Theraband Level (Shoulder Horizontal ABduction) Level 2 (Red)    External Rotation Strengthening;Both;20 reps;Theraband    Theraband Level (Shoulder External Rotation) Level 2 (Red)    Row Strengthening;Both;20 reps;Theraband  Theraband Level (Shoulder Row) Level 3 (Green)    Other Standing Exercises low trap setting 2x10      Shoulder Exercises: ROM/Strengthening   UBE (Upper Arm Bike) L1, 3 min forward, 2 min back      Shoulder Exercises: Stretch   Other Shoulder Stretches doorway stretch low and mid 30 sec x 2 reps      Manual Therapy   Manual therapy comments skilled palpation to assess response to DN and manual work    Soft tissue mobilization deep tissue work through the upper traps; posterior cervical musculature to medial scapular musculature bilat              Trigger Point Dry Needling - 04/02/21 0001     Education Handout Provided Previously provided    Muscles Treated Head and Neck Splenius capitus;Cervical multifidi    Dry Needling Comments bilat    Splenius capitus Response Twitch reponse elicited;Palpable increased muscle length    Cervical multifidi Response Twitch reponse elicited;Palpable increased muscle length                   PT Education - 04/02/21 1333     Education Details Discussed HEP updates     Person(s) Educated Patient    Methods Explanation;Demonstration;Tactile cues;Verbal cues;Handout    Comprehension Verbalized understanding;Returned demonstration;Verbal cues required;Tactile cues required                 PT Long Term Goals - 04/02/21 1336       PT LONG TERM GOAL #1   Title Pt will be independent with HEP    Time 6    Period Weeks    Status New      PT LONG TERM GOAL #2   Title Pt will report 50% improvement in her dizzy symptoms    Time 6    Period Weeks    Status New      PT LONG TERM GOAL #3   Title Pt will report at least 50% improvement in her neck pain/ache for improved lifting and holding 5#    Time 6    Period Weeks    Status New      PT LONG TERM GOAL #4   Title Pt will be able to demo improved cervical ROM by at least 10 deg for side bending and rotation    Time 6    Period Weeks    Status Achieved                   Plan - 04/02/21 0958     Clinical Impression Statement Pt with good response to trial of DN. Treatment focused on improving cervical ROM with TPDN and manual therapy. Initiated posterior shoulder girdle strengthening. Cues for pt to decrease UT overactivity during exercises. Pt notes feeling better with her neck mobility and pain at end of session.    Personal Factors and Comorbidities Age;Time since onset of injury/illness/exacerbation    Examination-Activity Limitations Carry;Lift;Stand;Sleep;Sit    Examination-Participation Restrictions Shop;Cleaning;Meal Prep    Stability/Clinical Decision Making Evolving/Moderate complexity    Rehab Potential Fair    PT Frequency 1x / week    PT Duration 6 weeks    PT Treatment/Interventions ADLs/Self Care Home Management;Aquatic Therapy;Cryotherapy;Electrical Stimulation;Iontophoresis 4mg /ml Dexamethasone;Moist Heat;Traction;Neuromuscular re-education;Therapeutic exercise;Therapeutic activities;Functional mobility training;Patient/family education;Manual techniques;Passive range  of motion;Dry needling;Taping;Vestibular    PT Next Visit Plan continue with DN and manual work through upper trap, levators, pecs and periscapular musculature. Continue strengthening low/mid trap  and c-spine. May need to check grip strength (didn't have time this session) as pt has been report dropping items. Initiate VOR exercises if indicated.    PT Home Exercise Plan Self massage with tennis ball; Access Code [email or web address]    Consulted and Agree with Plan of Care Patient             Patient will benefit from skilled therapeutic intervention in order to improve the following deficits and impairments:  Increased fascial restricitons, Decreased range of motion, Dizziness, Decreased activity tolerance, Pain, Impaired UE functional use, Increased muscle spasms, Decreased mobility, Decreased strength, Impaired sensation, Postural dysfunction, Improper body mechanics  Visit Diagnosis: Cervicalgia  Abnormal posture  Muscle weakness (generalized)  Dizziness and giddiness     Problem List Patient Active Problem List   Diagnosis Date Noted   RLS (restless legs syndrome) 03/26/2021   Moderate recurrent major depression ([place]) 11/29/2020   Abnormal MRI, cervical spine 04/30/2020   Abscess of right axilla 04/18/2020   White matter abnormality on MRI of brain 01/15/2020   Numbness 01/15/2020   Demyelinating disease of central nervous system ([place]) 11/26/2019   Carpal tunnel syndrome, bilateral 11/23/2019   Bloating 11/20/2019   Epigastric pain 11/20/2019   Type 2 diabetes mellitus with chronic kidney disease, without long-term current use of insulin ([place]) 06/25/2019   Acute pain of both knees 06/25/2019   DDD (degenerative disc disease), cervical 04/04/2019   Mixed hyperlipidemia 05/22/2018   Dyslipidemia, goal LDL below 70 05/22/2018   Upper back pain 05/19/2018   Benign paroxysmal positional vertigo due to bilateral vestibular disorder 05/19/2018   Allergic rhinitis due to allergen  08/08/2017   Microalbuminuria due to type 2 diabetes mellitus ([place]) 08/25/2015   Class 1 obesity due to excess calories with serious comorbidity and body mass index (BMI) of 34.0 to 34.9 in adult 03/21/2015   Depression 03/21/2015   CKD (chronic kidney disease) stage 3, GFR 30-59 ml/min ([facility]) 12/16/2014   Hidradenitis suppurativa 12/13/2014   Tobacco dependence 05/31/2014   Abnormal weight gain 05/31/2014   Obese 05/31/2014   Essential hypertension, benign 05/31/2014   Diabetes mellitus without complication ([place]) 05/69/7948   Menopausal symptoms 05/31/2014    [facility] April Gordy Levan, PT, DPT 04/02/2021, 1:38 PM  [facility] [address] [place], [place], [address] Phone: [phone]   Fax:  [phone]  Name: Madeeha Costantino MRN: [id number] Date of Birth: 18-Feb-1966

## 2021-04-08 ENCOUNTER — Encounter: Payer: 59 | Admitting: Rehabilitative and Restorative Service Providers"

## 2021-04-13 ENCOUNTER — Other Ambulatory Visit: Payer: Self-pay | Admitting: Physician Assistant

## 2021-04-13 DIAGNOSIS — E119 Type 2 diabetes mellitus without complications: Secondary | ICD-10-CM

## 2021-04-15 ENCOUNTER — Other Ambulatory Visit: Payer: Self-pay | Admitting: Physician Assistant

## 2021-04-16 ENCOUNTER — Ambulatory Visit (INDEPENDENT_AMBULATORY_CARE_PROVIDER_SITE_OTHER): Payer: 59 | Admitting: Rehabilitative and Restorative Service Providers"

## 2021-04-16 ENCOUNTER — Other Ambulatory Visit: Payer: Self-pay

## 2021-04-16 ENCOUNTER — Encounter: Payer: Self-pay | Admitting: Rehabilitative and Restorative Service Providers"

## 2021-04-16 DIAGNOSIS — M542 Cervicalgia: Secondary | ICD-10-CM

## 2021-04-16 DIAGNOSIS — R293 Abnormal posture: Secondary | ICD-10-CM

## 2021-04-16 DIAGNOSIS — M6281 Muscle weakness (generalized): Secondary | ICD-10-CM | POA: Diagnosis not present

## 2021-04-16 NOTE — Therapy (Signed)
[place], [place], [address] Phone: [phone]   Fax:  [phone]  Physical Therapy Treatment  Patient Details  Name: Nina Trujillo MRN: [id number] Date of Birth: 01-Apr-1966 Referring Provider (PT): Silverio Decamp, MD   Encounter Date: 04/16/2021   PT End of Session - 04/16/21 1019     Visit Number 4    Number of Visits 6    Date for PT Re-Evaluation 04/30/21    Authorization Type [employer]    PT Start Time 1016    PT Stop Time 3818    PT Time Calculation (min) 49 min    Activity Tolerance Patient tolerated treatment well             Past Medical History:  Diagnosis Date   Chronic kidney disease    stage 3    Depression    Diabetes mellitus without complication ([place])    Hypertension     Past Surgical History:  Procedure Laterality Date   ABDOMINAL HYSTERECTOMY     both uterus and ovaries removed.    COLONOSCOPY  10 + years ago    IN [facility]-normal exam per pt    There were no vitals filed for this visit.   Subjective Assessment - 04/16/21 1019     Subjective Patient reports that her neck pain has continued "off and on". She has had ~ 4 episodes of dizziness in the past week lasting 3-4 seconds most of the time when she is bending down. Resolves within a few seconds. DN continues to be helpful. She is working on her HEP.    Currently in Pain? Yes    Pain Score 5     Pain Location Neck    Pain Orientation Mid    Pain Descriptors / Indicators Tightness    Pain Type Chronic pain                [facility] PT Assessment - 04/16/21 0001       Assessment   Medical Diagnosis BPPV and neck pain    Referring Provider (PT) Silverio Decamp, MD    Hand Dominance Right    Prior Therapy Yes, upper back (@ [facility])      AROM   Cervical Flexion 42    Cervical Extension 54    Cervical - Right Side Bend 47    Cervical - Left Side Bend 49    Cervical  - Right Rotation 70    Cervical - Left Rotation 71      Palpation   Palpation comment Palpable tautness and trigger points along bilat UTs, suboccipitals, levators, pecs and periscapular muscles                           [facility] Adult PT Treatment/Exercise - 04/16/21 0001       Neck Exercises: Supine   Neck Retraction 10 reps      Neck Exercises: Prone   Axial Exension 10 reps    Axial Extension Limitations with scap squeeze keeping chin/neck in neutral posture      Neck Exercises: Stretches   Upper Trapezius Stretch Right;Left;4 reps;10 seconds    Upper Trapezius Stretch Limitations seated focus on posture and alignment for stretch    Neck Stretch 5 reps    Neck Stretch Limitations cervical extension using pillowcase to support head/posterior cervical spine for cervical extension    Other Neck Stretches rotation x10  R & L    Other Neck Stretches nodding yes/no      Shoulder Exercises: Standing   Horizontal ABduction Strengthening;Both;20 reps;Theraband    Theraband Level (Shoulder Horizontal ABduction) Level 2 (Red)    External Rotation Strengthening;Both;20 reps;Theraband    Theraband Level (Shoulder External Rotation) Level 2 (Red)    Row Strengthening;Both;20 reps;Theraband    Theraband Level (Shoulder Row) Level 3 (Green)      Shoulder Exercises: ROM/Strengthening   UBE (Upper Arm Bike) L4 x 4 min 2 min fwd/2 min back    Other ROM/Strengthening Exercises shoulder rolls posterior      Shoulder Exercises: Stretch   Other Shoulder Stretches doorway stretch three positions 30 sec x 2 reps      Moist Heat Therapy   Number Minutes Moist Heat 10 Minutes    Moist Heat Location Cervical;Shoulder      Manual Therapy   Manual therapy comments skilled palpation to assess response to DN and manual work    Soft tissue mobilization deep tissue work through the upper traps; posterior cervical musculature to medial scapular musculature bilat    Myofascial Release  posterior cervical to thoracic spine              Trigger Point Dry Needling - 04/16/21 0001     Dry Needling Comments bilat    Upper Trapezius Response Palpable increased muscle length    Suboccipitals Response Palpable increased muscle length    Splenius capitus Response Palpable increased muscle length    Cervical multifidi Response Palpable increased muscle length                        PT Long Term Goals - 04/02/21 1336       PT LONG TERM GOAL #1   Title Pt will be independent with HEP    Time 6    Period Weeks    Status New      PT LONG TERM GOAL #2   Title Pt will report 50% improvement in her dizzy symptoms    Time 6    Period Weeks    Status New      PT LONG TERM GOAL #3   Title Pt will report at least 50% improvement in her neck pain/ache for improved lifting and holding 5#    Time 6    Period Weeks    Status New      PT LONG TERM GOAL #4   Title Pt will be able to demo improved cervical ROM by at least 10 deg for side bending and rotation    Time 6    Period Weeks    Status Achieved                   Plan - 04/16/21 1100     Clinical Impression Statement Symptoms continue on an intermittent basis. Good gains in cervical ROM/mobility. Patient demonstrates improved tissue extensibiliity with DN/manual work/exercise. Progressing gradually toward stated goals of therapy.    Rehab Potential Good    PT Frequency 1x / week    PT Duration 6 weeks    PT Treatment/Interventions ADLs/Self Care Home Management;Aquatic Therapy;Cryotherapy;Electrical Stimulation;Iontophoresis 4mg /ml Dexamethasone;Moist Heat;Traction;Neuromuscular re-education;Therapeutic exercise;Therapeutic activities;Functional mobility training;Patient/family education;Manual techniques;Passive range of motion;Dry needling;Taping;Vestibular    PT Next Visit Plan continue with DN and manual work through upper trap, levators, pecs and periscapular musculature. Continue  strengthening low/mid trap and c-spine. May need to check grip strength (didn't have time  this session) as pt has been report dropping items. Initiate VOR exercises if indicated.    PT Home Exercise Plan [id number]    Consulted and Agree with Plan of Care Patient             Patient will benefit from skilled therapeutic intervention in order to improve the following deficits and impairments:     Visit Diagnosis: Cervicalgia  Abnormal posture  Muscle weakness (generalized)     Problem List Patient Active Problem List   Diagnosis Date Noted   RLS (restless legs syndrome) 03/26/2021   Moderate recurrent major depression ([place]) 11/29/2020   Abnormal MRI, cervical spine 04/30/2020   Abscess of right axilla 04/18/2020   White matter abnormality on MRI of brain 01/15/2020   Numbness 01/15/2020   Demyelinating disease of central nervous system ([place]) 11/26/2019   Carpal tunnel syndrome, bilateral 11/23/2019   Bloating 11/20/2019   Epigastric pain 11/20/2019   Type 2 diabetes mellitus with chronic kidney disease, without long-term current use of insulin ([place]) 06/25/2019   Acute pain of both knees 06/25/2019   DDD (degenerative disc disease), cervical 04/04/2019   Mixed hyperlipidemia 05/22/2018   Dyslipidemia, goal LDL below 70 05/22/2018   Upper back pain 05/19/2018   Benign paroxysmal positional vertigo due to bilateral vestibular disorder 05/19/2018   Allergic rhinitis due to allergen 08/08/2017   Microalbuminuria due to type 2 diabetes mellitus ([place]) 08/25/2015   Class 1 obesity due to excess calories with serious comorbidity and body mass index (BMI) of 34.0 to 34.9 in adult 03/21/2015   Depression 03/21/2015   CKD (chronic kidney disease) stage 3, GFR 30-59 ml/min ([facility]) 12/16/2014   Hidradenitis suppurativa 12/13/2014   Tobacco dependence 05/31/2014   Abnormal weight gain 05/31/2014   Obese 05/31/2014   Essential hypertension, benign 05/31/2014   Diabetes mellitus without  complication ([place]) 86/57/8469   Menopausal symptoms 05/31/2014    Norah Fick Nilda Simmer, PT, MPH  04/16/2021, 11:05 AM  [facility] [place] [address] [place], [place], [address] Phone: [phone]   Fax:  [phone]  Name: Nina Trujillo MRN: [id number] Date of Birth: Aug 11, 1965

## 2021-04-23 ENCOUNTER — Ambulatory Visit (INDEPENDENT_AMBULATORY_CARE_PROVIDER_SITE_OTHER): Payer: 59 | Admitting: Physical Therapy

## 2021-04-23 ENCOUNTER — Other Ambulatory Visit: Payer: Self-pay

## 2021-04-23 DIAGNOSIS — M6281 Muscle weakness (generalized): Secondary | ICD-10-CM | POA: Diagnosis not present

## 2021-04-23 DIAGNOSIS — M542 Cervicalgia: Secondary | ICD-10-CM

## 2021-04-23 DIAGNOSIS — R293 Abnormal posture: Secondary | ICD-10-CM | POA: Diagnosis not present

## 2021-04-23 DIAGNOSIS — R42 Dizziness and giddiness: Secondary | ICD-10-CM | POA: Diagnosis not present

## 2021-04-23 NOTE — Therapy (Signed)
[place], [place], [address] Phone: [phone]   Fax:  [phone]  Physical Therapy Treatment  Patient Details  Name: Marguerita Stapp MRN: [id number] Date of Birth: 11/21/1965 Referring Provider (PT): Silverio Decamp, MD   Encounter Date: 04/23/2021   PT End of Session - 04/23/21 1012     Visit Number 5    Number of Visits 6    Date for PT Re-Evaluation 04/30/21    Authorization Type [employer]    PT Start Time 8099    PT Stop Time 1100    PT Time Calculation (min) 45 min    Activity Tolerance Patient tolerated treatment well             Past Medical History:  Diagnosis Date   Chronic kidney disease    stage 3    Depression    Diabetes mellitus without complication ([place])    Hypertension     Past Surgical History:  Procedure Laterality Date   ABDOMINAL HYSTERECTOMY     both uterus and ovaries removed.    COLONOSCOPY  10 + years ago    IN [facility]-normal exam per pt    There were no vitals filed for this visit.   Subjective Assessment - 04/23/21 1015     Subjective Patient reports that her neck pain has continued "off and on". She has had ~ 4 episodes of dizziness in the past week lasting 3-4 seconds most of the time when she is bending down. Resolves within a few seconds. DN continues to be helpful. She is working on her HEP.                               [facility] Adult PT Treatment/Exercise - 04/23/21 0001       Manual Therapy   Manual therapy comments skilled palpation to assess response to DN and manual work    Soft tissue mobilization deep tissue work through the upper traps; posterior cervical musculature to medial scapular musculature bilat             Vestibular Treatment/Exercise - 04/23/21 0001       Vestibular Treatment/Exercise   Habituation Exercises Standing Diagonal Head Turns;Comment;Standing Horizontal Head  Turns;Standing Vertical Head Turns    Gaze Exercises X1 Viewing Horizontal;X1 Viewing Vertical;Eye/Head Exercise Horizontal;Eye/Head Exercise Vertical      Standing Horizontal Head Turns   Number of Reps  20    Symptom Description  Eyes closed on foam      Standing Vertical Head Turns   Number of Reps  20    Symptom Description  Eyes closed on foam      Standing Diagonal Head Turns   Number of Reps  10    Symptiom Description  Eyes open on foam      X1 Viewing Horizontal   Foot Position Feet together staning    Reps 2    Comments x30 sec      X1 Viewing Vertical   Foot Position Feet together standing    Reps 2    Comments x30 sec      Eye/Head Exercise Horizontal   Foot Position Feet together standing    Reps 2    Comments x30 sec saccades      Eye/Head Exercise Vertical   Foot Position Feet together standing    Reps 2    Comments x30 sec saccades  Trigger Point Dry Needling - 04/23/21 0001     Muscles Treated Upper Quadrant Rhomboids;Supraspinatus    Dry Needling Comments bilat    Upper Trapezius Response Twitch reponse elicited;Palpable increased muscle length    Cervical multifidi Response Palpable increased muscle length;Twitch reponse elicited    Rhomboids Response Twitch response elicited;Palpable increased muscle length    Supraspinatus Response Twitch response elicited;Palpable increased muscle length                        PT Long Term Goals - 04/02/21 1336       PT LONG TERM GOAL #1   Title Pt will be independent with HEP    Time 6    Period Weeks    Status New      PT LONG TERM GOAL #2   Title Pt will report 50% improvement in her dizzy symptoms    Time 6    Period Weeks    Status New      PT LONG TERM GOAL #3   Title Pt will report at least 50% improvement in her neck pain/ache for improved lifting and holding 5#    Time 6    Period Weeks    Status New      PT LONG TERM GOAL #4   Title Pt will be able to  demo improved cervical ROM by at least 10 deg for side bending and rotation    Time 6    Period Weeks    Status Achieved                   Plan - 04/23/21 1308     Clinical Impression Statement Pt continues to demo improving cervical ROM and tissue extensility. Continued TPDN as pt tolerated. Initiated vestibular exercises as pt still gets some bouts of dizziness. I believe this is more due to the lack of input her vestibular system had been recieving due to how limited her cervical ROM was and she is now finally able to stimulate it more. Pt able to tolerate it well this session. Discussed with pt that she will need to think about whether she feels ready to d/c from PT on her next scheduled appointment or if she feels that she would like more visits.    Personal Factors and Comorbidities Age;Time since onset of injury/illness/exacerbation    Examination-Activity Limitations Carry;Lift;Stand;Sleep;Sit    Stability/Clinical Decision Making Evolving/Moderate complexity    Rehab Potential Good    PT Frequency 1x / week    PT Duration 6 weeks    PT Treatment/Interventions ADLs/Self Care Home Management;Aquatic Therapy;Cryotherapy;Electrical Stimulation;Iontophoresis 4mg /ml Dexamethasone;Moist Heat;Traction;Neuromuscular re-education;Therapeutic exercise;Therapeutic activities;Functional mobility training;Patient/family education;Manual techniques;Passive range of motion;Dry needling;Taping;Vestibular    PT Next Visit Plan continue with DN and manual work through upper trap, levators, pecs and periscapular musculature. Continue strengthening low/mid trap and c-spine. May need to check grip strength (didn't have time this session) as pt has been report dropping items. Initiate VOR exercises if indicated.    PT Home Exercise Plan [id number]    Consulted and Agree with Plan of Care Patient             Patient will benefit from skilled therapeutic intervention in order to improve the  following deficits and impairments:  Increased fascial restricitons, Decreased range of motion, Dizziness, Decreased activity tolerance, Pain, Impaired UE functional use, Increased muscle spasms, Decreased mobility, Decreased strength, Impaired sensation, Postural dysfunction, Improper body mechanics  Visit Diagnosis: Cervicalgia  Abnormal posture  Muscle weakness (generalized)  Dizziness and giddiness     Problem List Patient Active Problem List   Diagnosis Date Noted   RLS (restless legs syndrome) 03/26/2021   Moderate recurrent major depression ([place]) 11/29/2020   Abnormal MRI, cervical spine 04/30/2020   Abscess of right axilla 04/18/2020   White matter abnormality on MRI of brain 01/15/2020   Numbness 01/15/2020   Demyelinating disease of central nervous system ([place]) 11/26/2019   Carpal tunnel syndrome, bilateral 11/23/2019   Bloating 11/20/2019   Epigastric pain 11/20/2019   Type 2 diabetes mellitus with chronic kidney disease, without long-term current use of insulin ([place]) 06/25/2019   Acute pain of both knees 06/25/2019   DDD (degenerative disc disease), cervical 04/04/2019   Mixed hyperlipidemia 05/22/2018   Dyslipidemia, goal LDL below 70 05/22/2018   Upper back pain 05/19/2018   Benign paroxysmal positional vertigo due to bilateral vestibular disorder 05/19/2018   Allergic rhinitis due to allergen 08/08/2017   Microalbuminuria due to type 2 diabetes mellitus ([place]) 08/25/2015   Class 1 obesity due to excess calories with serious comorbidity and body mass index (BMI) of 34.0 to 34.9 in adult 03/21/2015   Depression 03/21/2015   CKD (chronic kidney disease) stage 3, GFR 30-59 ml/min ([facility]) 12/16/2014   Hidradenitis suppurativa 12/13/2014   Tobacco dependence 05/31/2014   Abnormal weight gain 05/31/2014   Obese 05/31/2014   Essential hypertension, benign 05/31/2014   Diabetes mellitus without complication ([place]) 68/25/7493   Menopausal symptoms 05/31/2014    [facility]  April Gordy Levan, PT, DPT 04/23/2021, 1:15 PM  [facility] [place] [address] [place], [place], [address] Phone: [phone]   Fax:  [phone]  Name: Linzy Laury MRN: [id number] Date of Birth: 03/07/1966

## 2021-05-01 ENCOUNTER — Encounter: Payer: Self-pay | Admitting: Rehabilitative and Restorative Service Providers"

## 2021-05-01 ENCOUNTER — Ambulatory Visit (INDEPENDENT_AMBULATORY_CARE_PROVIDER_SITE_OTHER): Payer: 59 | Admitting: Rehabilitative and Restorative Service Providers"

## 2021-05-01 ENCOUNTER — Other Ambulatory Visit: Payer: Self-pay

## 2021-05-01 DIAGNOSIS — R293 Abnormal posture: Secondary | ICD-10-CM

## 2021-05-01 DIAGNOSIS — R42 Dizziness and giddiness: Secondary | ICD-10-CM | POA: Diagnosis not present

## 2021-05-01 DIAGNOSIS — M542 Cervicalgia: Secondary | ICD-10-CM

## 2021-05-01 DIAGNOSIS — M6281 Muscle weakness (generalized): Secondary | ICD-10-CM | POA: Diagnosis not present

## 2021-05-01 NOTE — Therapy (Addendum)
[facility] °[facility] °[address] °[place], [place], [address] °Phone: [phone]   Fax:  [phone] ° °Physical Therapy Treatment and Discharge Summary ° °PHYSICAL THERAPY DISCHARGE SUMMARY ° °Visits from Start of Care: 6 ° °Current functional level related to goals / functional outcomes: °See progress note for discharge status °  °Remaining deficits: °Unknown  °  °Education / Equipment: °HEP ° °Patient agrees to discharge. Patient goals were partially met. Patient is being discharged due to not returning since the last visit. ° P.  PT, MPH °05/27/21 3:48 PM ° ° °Patient Details  °Name: Nina Trujillo °MRN: [id number] °Date of Birth: 09/08/1965 °Referring Provider (PT): Thekkekandam, Thomas J, MD ° ° °Encounter Date: 05/01/2021 ° ° PT End of Session - 05/01/21 1017   ° ° Visit Number 6   ° PT Start Time 1015   ° PT Stop Time 1104   ° PT Time Calculation (min) 49 min   ° Activity Tolerance Patient tolerated treatment well   ° °  °  ° °  ° ° °Past Medical History:  °Diagnosis Date  ° Chronic kidney disease   ° stage 3   ° Depression   ° Diabetes mellitus without complication (HCC)   ° Hypertension   ° ° °Past Surgical History:  °Procedure Laterality Date  ° ABDOMINAL HYSTERECTOMY    ° both uterus and ovaries removed.   ° COLONOSCOPY  10 + years ago   ° IN [facility]-normal exam per pt  ° ° °There were no vitals filed for this visit. ° ° Subjective Assessment - 05/01/21 1017   ° ° Subjective Patient reports continued tightness in her back. She had "a couple of dizzy spells this week'.When she is doing something in a hurry is when she notices the dizziness. She has worked on the exercises looking at home. Not the balance as much. She has some trouble regulating her 'sugar'. Doing her meds but during the course of the day she has fluctuations in her blood sugar level.   ° Currently in Pain? Yes   ° Pain Score 6    ° Pain Location Neck   ° Pain Orientation  Mid   ° Pain Descriptors / Indicators Tightness   ° Pain Type Chronic pain   ° Pain Onset More than a month ago   ° °  °  ° °  ° ° ° ° ° [facility] PT Assessment - 05/01/21 0001   ° °  ° Assessment  ° Medical Diagnosis BPPV and neck pain   ° Referring Provider (PT) Thekkekandam, Thomas J, MD   ° Hand Dominance Right   ° Prior Therapy Yes, upper back (@ [facility])   ° °  °  ° °  ° ° ° ° ° ° ° ° ° ° ° ° ° ° ° ° [facility] Adult PT Treatment/Exercise - 05/01/21 0001   ° °  ° Neck Exercises: Prone  ° Axial Exension 10 reps   ° Axial Extension Limitations with scap squeeze keeping chin/neck in neutral posture   °  ° Neck Exercises: Stretches  ° Upper Trapezius Stretch Right;Left;4 reps;10 seconds   ° Upper Trapezius Stretch Limitations seated focus on posture and alignment for stretch   ° Neck Stretch 5 reps   ° Other Neck Stretches rotation x10 R & L   ° Other Neck Stretches nodding yes/no   °  ° Shoulder Exercises: Standing  ° Horizontal ABduction Strengthening;Both;20 reps;Theraband   °   Theraband Level (Shoulder Horizontal ABduction) Level 2 (Red)   ° External Rotation Strengthening;Both;20 reps;Theraband   ° Theraband Level (Shoulder External Rotation) Level 2 (Red)   ° Row Strengthening;Both;20 reps;Theraband   ° Theraband Level (Shoulder Row) Level 3 (Green)   °  ° Shoulder Exercises: ROM/Strengthening  ° UBE (Upper Arm Bike) L4 x 4 min 2 min fwd/2 min back   ° Other ROM/Strengthening Exercises shoulder rolls posterior   °  ° Shoulder Exercises: Stretch  ° Other Shoulder Stretches doorway stretch three positions 30 sec x 2 reps   °  ° Moist Heat Therapy  ° Number Minutes Moist Heat 10 Minutes   ° Moist Heat Location Cervical;Shoulder   °  ° Electrical Stimulation  ° Electrical Stimulation Location bilat cervical and thoracic paraspinals   ° Electrical Stimulation Action TENS   ° Electrical Stimulation Parameters to tolerance   ° Electrical Stimulation Goals Pain;Tone   °  ° Manual Therapy  ° Manual therapy comments skilled  palpation to assess response to DN and manual work   ° Soft tissue mobilization deep tissue work through the upper traps; posterior cervical musculature to medial scapular musculature bilat   ° °  °  ° °  ° ° ° Trigger Point Dry Needling - 05/01/21 0001   ° ° Consent Given? Yes   ° Education Handout Provided Previously provided   ° Dry Needling Comments bilat   ° Upper Trapezius Response Palpable increased muscle length   ° Suboccipitals Response Palpable increased muscle length   ° Cervical multifidi Response Palpable increased muscle length   ° Thoracic multifidi response Palpable increased muscle length   ° °  °  ° °  ° ° ° ° ° ° ° ° PT Education - 05/01/21 1055   ° ° Education Details instruction in use and care of TENS unit   ° Person(s) Educated Patient   ° Methods Explanation   ° Comprehension Verbalized understanding   ° °  °  ° °  ° ° ° ° ° ° PT Long Term Goals - 05/01/21 1024   ° °  ° PT LONG TERM GOAL #1  ° Title Pt will be independent with HEP   ° Time 6   ° Period Weeks   ° Status On-going   ° Target Date 06/12/21   °  ° PT LONG TERM GOAL #2  ° Title Pt will report 50% improvement in her dizzy symptoms   ° Time 6   ° Period Weeks   ° Status On-going   ° Target Date 06/12/21   °  ° PT LONG TERM GOAL #3  ° Title Pt will report at least 50% improvement in her neck pain/ache for improved lifting and holding 5#   ° Time 6   ° Period Weeks   ° Status Partially Met   ° Target Date 06/12/21   °  ° PT LONG TERM GOAL #4  ° Title Pt will be able to demo improved cervical ROM by at least 10 deg for side bending and rotation   ° Time 6   ° Period Weeks   ° Status Achieved   ° °  °  ° °  ° ° ° ° ° ° ° ° Plan - 05/01/21 1027   ° ° Clinical Impression Statement Patient continues to report gradual improvement. She has less frequent episodes of dizziness. Neck and upper back pain are improving. Paient has continued tightness to palpation through the   cervicl and thoracic spine musculature. Good response to manual work and  DN with HEP including stretching and strengthening. Patient is progressing toward stated goals of therapy. She will benefit from continued treatment to address remaining deficits.   ° Rehab Potential Good   ° PT Frequency 1x / week   ° PT Duration 6 weeks   ° PT Treatment/Interventions ADLs/Self Care Home Management;Aquatic Therapy;Cryotherapy;Electrical Stimulation;Iontophoresis 4mg/ml Dexamethasone;Moist Heat;Traction;Neuromuscular re-education;Therapeutic exercise;Therapeutic activities;Functional mobility training;Patient/family education;Manual techniques;Passive range of motion;Dry needling;Taping;Vestibular   ° PT Next Visit Plan continue with DN and manual work through upper trap, levators, pecs and periscapular musculature. Continue strengthening low/mid trap and c-spine. Continue VOR exercises if indicated.   ° PT Home Exercise Plan [id number]   ° Consulted and Agree with Plan of Care Patient   ° °  °  ° °  ° ° °Patient will benefit from skilled therapeutic intervention in order to improve the following deficits and impairments:    ° °Visit Diagnosis: °Cervicalgia ° °Abnormal posture ° °Muscle weakness (generalized) ° °Dizziness and giddiness ° ° ° ° °Problem List °Patient Active Problem List  ° Diagnosis Date Noted  ° RLS (restless legs syndrome) 03/26/2021  ° Moderate recurrent major depression (HCC) 11/29/2020  ° Abnormal MRI, cervical spine 04/30/2020  ° Abscess of right axilla 04/18/2020  ° White matter abnormality on MRI of brain 01/15/2020  ° Numbness 01/15/2020  ° Demyelinating disease of central nervous system (HCC) 11/26/2019  ° Carpal tunnel syndrome, bilateral 11/23/2019  ° Bloating 11/20/2019  ° Epigastric pain 11/20/2019  ° Type 2 diabetes mellitus with chronic kidney disease, without long-term current use of insulin (HCC) 06/25/2019  ° Acute pain of both knees 06/25/2019  ° DDD (degenerative disc disease), cervical 04/04/2019  ° Mixed hyperlipidemia 05/22/2018  ° Dyslipidemia, goal LDL below  70 05/22/2018  ° Upper back pain 05/19/2018  ° Benign paroxysmal positional vertigo due to bilateral vestibular disorder 05/19/2018  ° Allergic rhinitis due to allergen 08/08/2017  ° Microalbuminuria due to type 2 diabetes mellitus (HCC) 08/25/2015  ° Class 1 obesity due to excess calories with serious comorbidity and body mass index (BMI) of 34.0 to 34.9 in adult 03/21/2015  ° Depression 03/21/2015  ° CKD (chronic kidney disease) stage 3, GFR 30-59 ml/min (HCC) 12/16/2014  ° Hidradenitis suppurativa 12/13/2014  ° Tobacco dependence 05/31/2014  ° Abnormal weight gain 05/31/2014  ° Obese 05/31/2014  ° Essential hypertension, benign 05/31/2014  ° Diabetes mellitus without complication (HCC) 05/31/2014  ° Menopausal symptoms 05/31/2014  ° ° ° P , PT, MPH  °05/01/2021, 10:55 AM ° °[place] °[facility] °[address] °[place], [place], [address] °Phone: [phone]   Fax:  [phone] ° °Name: Nina Trujillo °MRN: [id number] °Date of Birth: 11/19/1965 ° ° ° °

## 2021-05-04 ENCOUNTER — Other Ambulatory Visit: Payer: Self-pay | Admitting: Sports Medicine

## 2021-05-04 DIAGNOSIS — M5030 Other cervical disc degeneration, unspecified cervical region: Secondary | ICD-10-CM

## 2021-05-07 ENCOUNTER — Encounter: Payer: 59 | Admitting: Physical Therapy

## 2021-05-15 ENCOUNTER — Encounter: Payer: 59 | Admitting: Physical Therapy

## 2021-06-02 ENCOUNTER — Telehealth: Payer: Self-pay | Admitting: Sports Medicine

## 2021-06-02 NOTE — Telephone Encounter (Signed)
Nina Trujillo seems to be seeking legal advice regarding her chronic neck pain, potentially looking for disability, I received a questionnaire from the [facility] law firm, I have filled out the questionnaire, this will be scanned and faxed back to law firm.

## 2021-06-05 ENCOUNTER — Encounter: Payer: Self-pay | Admitting: Neurology

## 2021-06-08 ENCOUNTER — Ambulatory Visit: Payer: Medicaid Other | Admitting: Sports Medicine

## 2021-06-10 ENCOUNTER — Other Ambulatory Visit: Payer: Self-pay

## 2021-06-10 ENCOUNTER — Ambulatory Visit (INDEPENDENT_AMBULATORY_CARE_PROVIDER_SITE_OTHER): Payer: Self-pay | Admitting: Sports Medicine

## 2021-06-10 DIAGNOSIS — M5030 Other cervical disc degeneration, unspecified cervical region: Secondary | ICD-10-CM

## 2021-06-10 NOTE — Assessment & Plan Note (Signed)
Nina Trujillo returns, she is a pleasant 56 year old female, she has chronic neck pain, she has multilevel cervical DDD with central canal stenosis worst from C4-C6. She does get bilateral radicular symptoms and is starting to drop things. She is had physical therapy, she has had gabapentin, she had a single epidural it did provide some good relief, I had recommended a second epidural at the last visit, she has not yet done this, she will proceed with a second epidural, and hopefully stacked a third epidural a month later. I would also like her to get a surgical opinion and she is interested as well. Return to see me after the next 2 epidurals and the surgical opinion.

## 2021-06-10 NOTE — Progress Notes (Signed)
° ° °  Procedures performed today:    None.  Independent interpretation of notes and tests performed by another provider:   None.  Brief History, Exam, Impression, and Recommendations:    DDD (degenerative disc disease), cervical Nina Trujillo returns, she is a pleasant 56 year old female, she has chronic neck pain, she has multilevel cervical DDD with central canal stenosis worst from C4-C6. She does get bilateral radicular symptoms and is starting to drop things. She is had physical therapy, she has had gabapentin, she had a single epidural it did provide some good relief, I had recommended a second epidural at the last visit, she has not yet done this, she will proceed with a second epidural, and hopefully stacked a third epidural a month later. I would also like Trujillo to get a surgical opinion and she is interested as well. Return to see me after the next 2 epidurals and the surgical opinion.    ___________________________________________ Nina Trujillo. Dianah Field, M.D., ABFM., CAQSM. Primary Care and [place] In[employer] of [employer] [facility]

## 2021-06-16 ENCOUNTER — Other Ambulatory Visit: Payer: Self-pay | Admitting: Physician Assistant

## 2021-06-16 ENCOUNTER — Telehealth: Payer: Self-pay

## 2021-06-16 DIAGNOSIS — I10 Essential (primary) hypertension: Secondary | ICD-10-CM

## 2021-06-16 DIAGNOSIS — F330 Major depressive disorder, recurrent, mild: Secondary | ICD-10-CM

## 2021-06-16 NOTE — Telephone Encounter (Unsigned)
Medication: Continuous Blood Gluc Sensor (DEXCOM G6 SENSOR)  Prior authorization submitted via CoverMyMeds on 06/16/2021 [place] submission pending

## 2021-06-17 ENCOUNTER — Encounter: Payer: Self-pay | Admitting: Sports Medicine

## 2021-06-17 ENCOUNTER — Other Ambulatory Visit: Payer: Self-pay | Admitting: Physician Assistant

## 2021-06-18 ENCOUNTER — Other Ambulatory Visit: Payer: Self-pay

## 2021-06-18 MED ORDER — DEXCOM G6 TRANSMITTER MISC
99 refills | Status: DC
  Filled 2021-06-18: qty 1, 90d supply, fill #0

## 2021-06-26 ENCOUNTER — Ambulatory Visit (INDEPENDENT_AMBULATORY_CARE_PROVIDER_SITE_OTHER): Payer: Self-pay | Admitting: Physician Assistant

## 2021-06-26 DIAGNOSIS — Z91199 Patient's noncompliance with other medical treatment and regimen due to unspecified reason: Secondary | ICD-10-CM

## 2021-06-26 DIAGNOSIS — N1831 Chronic kidney disease, stage 3a: Secondary | ICD-10-CM

## 2021-06-26 NOTE — Progress Notes (Signed)
No show

## 2021-07-03 ENCOUNTER — Other Ambulatory Visit: Payer: Self-pay | Admitting: Physician Assistant

## 2021-07-03 DIAGNOSIS — E119 Type 2 diabetes mellitus without complications: Secondary | ICD-10-CM

## 2021-07-03 DIAGNOSIS — E1122 Type 2 diabetes mellitus with diabetic chronic kidney disease: Secondary | ICD-10-CM

## 2021-07-03 DIAGNOSIS — E6609 Other obesity due to excess calories: Secondary | ICD-10-CM

## 2021-07-03 DIAGNOSIS — E782 Mixed hyperlipidemia: Secondary | ICD-10-CM

## 2021-07-03 DIAGNOSIS — E66811 Obesity, class 1: Secondary | ICD-10-CM

## 2021-07-03 DIAGNOSIS — Z6834 Body mass index (BMI) 34.0-34.9, adult: Secondary | ICD-10-CM

## 2021-07-07 ENCOUNTER — Ambulatory Visit (INDEPENDENT_AMBULATORY_CARE_PROVIDER_SITE_OTHER): Payer: Self-pay | Admitting: Physician Assistant

## 2021-07-07 ENCOUNTER — Other Ambulatory Visit: Payer: Self-pay

## 2021-07-07 VITALS — BP 129/72 | HR 86 | Ht 66.0 in | Wt 205.0 lb

## 2021-07-07 DIAGNOSIS — F331 Major depressive disorder, recurrent, moderate: Secondary | ICD-10-CM

## 2021-07-07 DIAGNOSIS — M5030 Other cervical disc degeneration, unspecified cervical region: Secondary | ICD-10-CM

## 2021-07-07 DIAGNOSIS — N951 Menopausal and female climacteric states: Secondary | ICD-10-CM

## 2021-07-07 DIAGNOSIS — N1831 Chronic kidney disease, stage 3a: Secondary | ICD-10-CM

## 2021-07-07 DIAGNOSIS — E1122 Type 2 diabetes mellitus with diabetic chronic kidney disease: Secondary | ICD-10-CM

## 2021-07-07 DIAGNOSIS — G379 Demyelinating disease of central nervous system, unspecified: Secondary | ICD-10-CM

## 2021-07-07 DIAGNOSIS — E782 Mixed hyperlipidemia: Secondary | ICD-10-CM

## 2021-07-07 DIAGNOSIS — F330 Major depressive disorder, recurrent, mild: Secondary | ICD-10-CM

## 2021-07-07 LAB — POCT GLYCOSYLATED HEMOGLOBIN (HGB A1C): Hemoglobin A1C: 5.5 % (ref 4.0–5.6)

## 2021-07-07 MED ORDER — ESTRADIOL 2 MG PO TABS
2.0000 mg | ORAL_TABLET | Freq: Every day | ORAL | 1 refills | Status: DC
  Filled 2021-10-29: qty 90, 90d supply, fill #0

## 2021-07-07 MED ORDER — BUPROPION HCL ER (SR) 150 MG PO TB12: 150.0000 mg | ORAL_TABLET | Freq: Two times a day (BID) | ORAL | 0 refills | Status: DC

## 2021-07-07 MED ORDER — ESCITALOPRAM OXALATE 20 MG PO TABS
20.0000 mg | ORAL_TABLET | Freq: Every day | ORAL | 1 refills | Status: DC
  Filled 2021-08-29 – 2021-11-18 (×2): qty 90, 90d supply, fill #0

## 2021-07-07 MED ORDER — ATORVASTATIN CALCIUM 20 MG PO TABS
20.0000 mg | ORAL_TABLET | Freq: Every day | ORAL | 3 refills | Status: DC
  Filled 2021-10-29: qty 90, 90d supply, fill #0

## 2021-07-07 NOTE — Progress Notes (Signed)
Subjective:    Patient ID: Nina Trujillo, female    DOB: Oct 17, 1965, 56 y.o.   MRN: [id number]  HPI Pt is a 56 yo obese female with T2DM, HTN, HLD, CKD, Cervical DDD, chronic neck pain, demyelinating disease of CNS. Pt needs forms filled out for disability.   Her neck pain continues to be symptomatic and painful with strength and pain radiating down upper and lower extermities. She has neurosurgeon and neurologist and sports medicine provider overseeing her care and next steps. It is becoming increasingly difficult to hold things in hands, walk, bend, twist, stand, sit for extended amounts of time. She is on gabapentin which helps but makes her sleepy and not able to cognitively do her job.   .. Active Ambulatory Problems    Diagnosis Date Noted   Tobacco dependence 05/31/2014   Abnormal weight gain 05/31/2014   Obese 05/31/2014   Essential hypertension, benign 05/31/2014   Diabetes mellitus without complication ([place]) 35/59/7416   Menopausal symptoms 05/31/2014   Hidradenitis suppurativa 12/13/2014   CKD (chronic kidney disease) stage 3, GFR 30-59 ml/min (HCC) 12/16/2014   Class 1 obesity due to excess calories with serious comorbidity and body mass index (BMI) of 34.0 to 34.9 in adult 03/21/2015   Depression 03/21/2015   Microalbuminuria due to type 2 diabetes mellitus ([place]) 08/25/2015   Allergic rhinitis due to allergen 08/08/2017   Upper back pain 05/19/2018   Benign paroxysmal positional vertigo due to bilateral vestibular disorder 05/19/2018   Mixed hyperlipidemia 05/22/2018   Dyslipidemia, goal LDL below 70 05/22/2018   DDD (degenerative disc disease), cervical 04/04/2019   Type 2 diabetes mellitus with chronic kidney disease, without long-term current use of insulin ([place]) 06/25/2019   Acute pain of both knees 06/25/2019   Bloating 11/20/2019   Epigastric pain 11/20/2019   Carpal tunnel syndrome, bilateral 11/23/2019   Demyelinating disease of central nervous system  ([place]) 11/26/2019   White matter abnormality on MRI of brain 01/15/2020   Numbness 01/15/2020   Abscess of right axilla 04/18/2020   Abnormal MRI, cervical spine 04/30/2020   Moderate recurrent major depression ([place]) 11/29/2020   RLS (restless legs syndrome) 03/26/2021   Resolved Ambulatory Problems    Diagnosis Date Noted   Cervical radiculopathy 04/09/2019   Cervical pain (neck) 11/20/2019   Past Medical History:  Diagnosis Date   Chronic kidney disease    Hypertension      Review of Systems See HPI>     Objective:   Physical Exam Vitals reviewed.  Constitutional:      Appearance: Normal appearance.  HENT:     Head: Normocephalic.  Cardiovascular:     Rate and Rhythm: Normal rate and regular rhythm.     Pulses: Normal pulses.  Pulmonary:     Effort: Pulmonary effort is normal.     Breath sounds: Normal breath sounds.  Musculoskeletal:     Comments: Upper ext strength 4/5.  Lower ext strength 4/5.   Neurological:     General: No focal deficit present.     Mental Status: She is alert and oriented to person, place, and time.  Psychiatric:        Mood and Affect: Mood normal.      .. Results for orders placed or performed in visit on 07/07/21  POCT glycosylated hemoglobin (Hb A1C)  Result Value Ref Range   Hemoglobin A1C 5.5 4.0 - 5.6 %   HbA1c POC (<> result, manual entry)     HbA1c, POC (prediabetic  range)     HbA1c, POC (controlled diabetic range)     .[name] Depression screen [facility] 2/9 07/07/2021 11/28/2020 09/24/2020 02/18/2020 04/04/2019  Decreased Interest 3 3 - 2 2  Down, Depressed, Hopeless 2 3 - 1 1  PHQ - 2 Score 5 6 - 3 3  Altered sleeping 3 3 - 3 1  Tired, decreased energy 2 2 - 2 2  Change in appetite 2 2 - 2 2  Feeling bad or failure about yourself  3 3 - 1 0  Trouble concentrating 2 2 - 2 2  Moving slowly or fidgety/restless 2 2 - 0 2  Suicidal thoughts 0 1 - 0 0  PHQ-9 Score 19 21 - 13 12  Difficult doing work/chores Very difficult Very  difficult Very difficult Somewhat difficult Not difficult at all   .[name] GAD 7 : Generalized Anxiety Score 07/07/2021 11/28/2020 02/18/2020 04/04/2019  Nervous, Anxious, on Edge 2 2 1  0  Control/stop worrying 2 3 0 0  Worry too much - different things 2 3 0 1  Trouble relaxing 3 2 2  0  Restless 3 2 1 1   Easily annoyed or irritable 2 2 1 1   Afraid - awful might happen 0 2 0 0  Total GAD 7 Score 14 16 5 3   Anxiety Difficulty Very difficult Somewhat difficult Somewhat difficult Somewhat difficult         Assessment & Plan:  Marland KitchenMarland KitchenRosalee was seen today for follow-up.  Diagnoses and all orders for this visit:  Type 2 diabetes mellitus with stage 3a chronic kidney disease, without long-term current use of insulin (HCC) -     POCT glycosylated hemoglobin (Hb A1C)  Moderate recurrent major depression (HCC) -     escitalopram (LEXAPRO) 20 MG tablet; Take 1 tablet (20 mg total) by mouth daily.  Mixed hyperlipidemia -     atorvastatin (LIPITOR) 20 MG tablet; Take 1 tablet (20 mg total) by mouth daily.  Mild episode of recurrent major depressive disorder (HCC) -     buPROPion (WELLBUTRIN SR) 150 MG 12 hr tablet; Take 1 tablet (150 mg total) by mouth 2 (two) times daily.  Menopausal symptoms -     estradiol (ESTRACE) 2 MG tablet; Take 1 tablet (2 mg total) by mouth daily.  DDD (degenerative disc disease), cervical  Demyelinating disease of central nervous system ([place])   Paperwork filled out.  Refilled estradiol.  Continue with specialist and continue with treatment plan for pain.  Refilled medications.   A1C to goal today.  Stay on same medications.  BP to goal.  On statin.  Needs eye exam.  Foot exam UTD.  Covid/flu/pneumonia UTD.  Follow up in 3 months.

## 2021-07-07 NOTE — Patient Instructions (Signed)
Next step is sater and neurological.

## 2021-07-08 ENCOUNTER — Encounter: Payer: Self-pay | Admitting: Physician Assistant

## 2021-07-12 ENCOUNTER — Other Ambulatory Visit: Payer: Self-pay | Admitting: Physician Assistant

## 2021-07-12 DIAGNOSIS — E6609 Other obesity due to excess calories: Secondary | ICD-10-CM

## 2021-07-21 ENCOUNTER — Telehealth: Payer: Self-pay

## 2021-07-21 DIAGNOSIS — E1122 Type 2 diabetes mellitus with diabetic chronic kidney disease: Secondary | ICD-10-CM

## 2021-07-21 NOTE — Telephone Encounter (Signed)
Initiated Prior authorization NGI:[id number] FlexTouch (insulin degludec injection) 100 Units/mL solution Via: Covermymeds Case/Key:[id number] Status: Pending as of 07/21/21 Reason: Notified Pt via: Mychart

## 2021-07-27 ENCOUNTER — Other Ambulatory Visit: Payer: Self-pay | Admitting: Physician Assistant

## 2021-07-27 ENCOUNTER — Other Ambulatory Visit (HOSPITAL_COMMUNITY): Payer: Self-pay

## 2021-07-27 DIAGNOSIS — E119 Type 2 diabetes mellitus without complications: Secondary | ICD-10-CM

## 2021-07-27 MED ORDER — TRESIBA FLEXTOUCH 100 UNIT/ML ~~LOC~~ SOPN
13.0000 [IU] | PEN_INJECTOR | Freq: Every day | SUBCUTANEOUS | 2 refills | Status: DC
  Filled 2021-07-27: qty 3, 23d supply, fill #0
  Filled 2021-07-27: qty 9, 69d supply, fill #0

## 2021-07-27 NOTE — Telephone Encounter (Signed)
PA

## 2021-07-28 ENCOUNTER — Encounter: Payer: Self-pay | Admitting: Physician Assistant

## 2021-07-28 ENCOUNTER — Other Ambulatory Visit: Payer: Self-pay | Admitting: Physician Assistant

## 2021-07-28 DIAGNOSIS — E119 Type 2 diabetes mellitus without complications: Secondary | ICD-10-CM

## 2021-07-28 DIAGNOSIS — E6609 Other obesity due to excess calories: Secondary | ICD-10-CM

## 2021-07-28 NOTE — Telephone Encounter (Signed)
Ok to send generic.

## 2021-07-28 NOTE — Telephone Encounter (Signed)
Ok to send generic with same dosage.

## 2021-07-28 NOTE — Telephone Encounter (Signed)
Not written as Brand only, please advise.  ?

## 2021-07-29 ENCOUNTER — Other Ambulatory Visit (HOSPITAL_COMMUNITY): Payer: Self-pay

## 2021-07-29 ENCOUNTER — Encounter: Payer: Self-pay | Admitting: Physician Assistant

## 2021-07-29 MED ORDER — LANTUS SOLOSTAR 100 UNIT/ML ~~LOC~~ SOPN: 13.0000 [IU] | PEN_INJECTOR | Freq: Every day | SUBCUTANEOUS | 1 refills | Status: DC

## 2021-07-29 MED ORDER — INSULIN DEGLUDEC 100 UNIT/ML ~~LOC~~ SOPN: 13.0000 [IU] | PEN_INJECTOR | SUBCUTANEOUS | Status: DC

## 2021-07-29 NOTE — Telephone Encounter (Signed)
Sent lantus

## 2021-07-29 NOTE — Addendum Note (Signed)
Addended by: Burton Apley A on: 07/29/2021 07:06 AM ? ? Modules accepted: Orders ? ?

## 2021-07-29 NOTE — Telephone Encounter (Signed)
Received VM from [place] stating PA was denied for [place] and they are recommending Basaglar or Lantus. Please advise.  ?

## 2021-08-07 ENCOUNTER — Telehealth: Payer: Self-pay

## 2021-08-07 DIAGNOSIS — M5030 Other cervical disc degeneration, unspecified cervical region: Secondary | ICD-10-CM

## 2021-08-07 NOTE — Telephone Encounter (Signed)
DOne

## 2021-08-07 NOTE — Telephone Encounter (Signed)
Patient called to request a referral to Dr Christella Noa. She states this was done back in January but she was unable to go and the referral was closed.  ?

## 2021-08-14 ENCOUNTER — Ambulatory Visit
Admission: RE | Admit: 2021-08-14 | Discharge: 2021-08-14 | Disposition: A | Discharge status: Home / Self Care | Payer: PRIVATE HEALTH INSURANCE | Source: Ambulatory Visit | Attending: Sports Medicine | Admitting: Sports Medicine

## 2021-08-14 DIAGNOSIS — M5030 Other cervical disc degeneration, unspecified cervical region: Secondary | ICD-10-CM

## 2021-08-14 IMAGING — XA DG INJECT/[PERSON_NAME] INC NEEDLE/CATH/PLC EPI/CERV/THOR W/IMG
2 series · 2 of 2 positions shown · non-contrast
Comparison: none

CLINICAL DATA: Cervical spondylosis without myelopathy. Positive
response to the epidural injection last year. Recurrent pain in the
neck, shoulders, and arms with burning, numbness, and weakness,
right worse than left.

[Series 1: ortho adipose · 1 of 1 slices shown (1 of 2)]
[im 1/1]
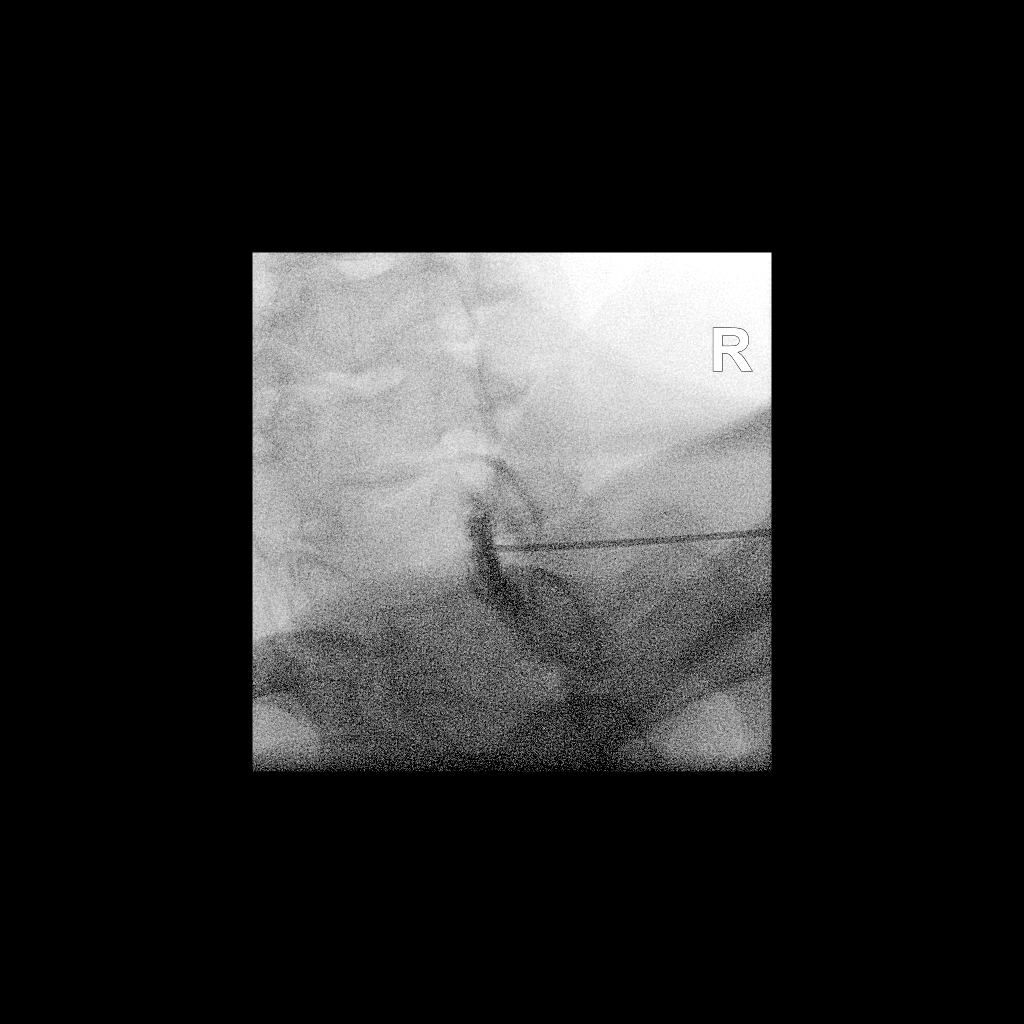

[Series 2: ortho adipose · 1 of 1 slices shown (2 of 2)]
[im 1/1]
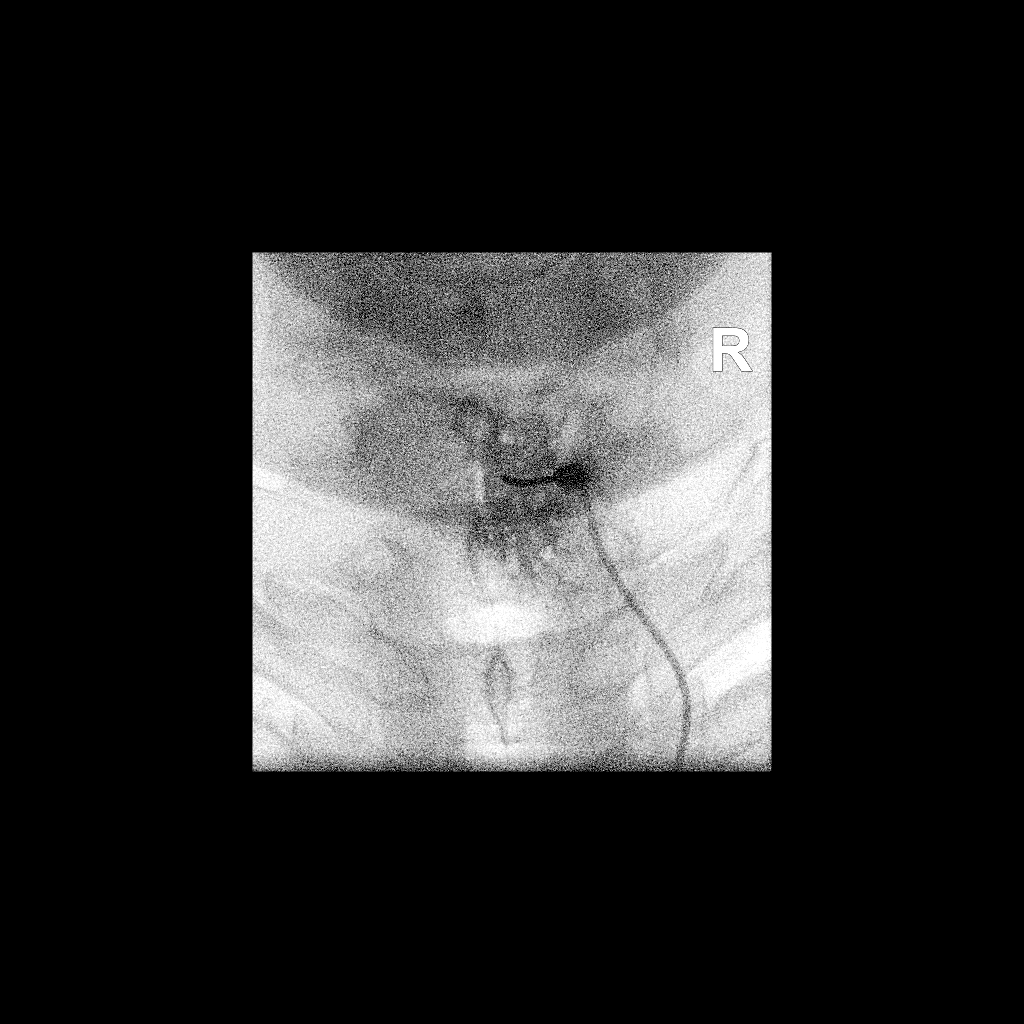

[2 of 2 positions shown; findings below may reference images not displayed]

FLUOROSCOPY:
Radiation Exposure Index (as provided by the fluoroscopic device):
2.0 mGy K[place]a

PROCEDURE:
The procedure, risks, benefits, and alternatives were explained to
the patient. Questions regarding the procedure were encouraged and
answered. The patient understands and consents to the procedure.

CERVICAL EPIDURAL INJECTION

An interlaminar approach was performed on the right at C7-T1. A
inch 20 gauge epidural needle was advanced using loss-of-resistance
technique.

DIAGNOSTIC EPIDURAL INJECTION

Injection of Isovue-M 300 shows a good epidural pattern with spread
above and below the level of needle placement, primarily on the
right. No vascular opacification is seen. THERAPEUTIC

EPIDURAL INJECTION

1.5 ml of Kenalog 40 mixed with 2 ml of normal saline were then
instilled. The procedure was well-tolerated, and the patient was
discharged thirty minutes following the injection in good condition.
IMPRESSION: Technically successful interlaminar epidural injection on the right
at C7-T1.

## 2021-08-14 MED ORDER — TRIAMCINOLONE ACETONIDE 40 MG/ML IJ SUSP (RADIOLOGY): 60.0000 mg | Freq: Once | INTRAMUSCULAR | Status: DC

## 2021-08-14 MED ORDER — IOPAMIDOL (ISOVUE-M 300) INJECTION 61%: 1.0000 mL | Freq: Once | INTRAMUSCULAR | Status: DC

## 2021-08-14 NOTE — Discharge Instructions (Signed)

## 2021-08-27 ENCOUNTER — Other Ambulatory Visit (HOSPITAL_COMMUNITY): Payer: Self-pay

## 2021-08-28 ENCOUNTER — Ambulatory Visit: Payer: Self-pay | Admitting: *Deleted

## 2021-08-28 ENCOUNTER — Other Ambulatory Visit (HOSPITAL_COMMUNITY): Payer: Self-pay

## 2021-08-29 ENCOUNTER — Other Ambulatory Visit (HOSPITAL_COMMUNITY): Payer: Self-pay

## 2021-08-29 MED ORDER — INSULIN GLARGINE-YFGN 100 UNIT/ML ~~LOC~~ SOPN: PEN_INJECTOR | SUBCUTANEOUS | 0 refills | Status: DC

## 2021-08-29 MED ORDER — INSULIN DEGLUDEC 100 UNIT/ML ~~LOC~~ SOPN: PEN_INJECTOR | SUBCUTANEOUS | 0 refills | Status: DC

## 2021-08-29 MED ORDER — DEXCOM G6 TRANSMITTER MISC
1 refills | Status: DC
  Filled 2021-11-18 – 2021-11-26 (×3): qty 1, 90d supply, fill #0

## 2021-09-15 ENCOUNTER — Ambulatory Visit: Payer: Medicaid Other | Admitting: Neurology

## 2021-09-23 ENCOUNTER — Encounter: Payer: Self-pay | Admitting: Physician Assistant

## 2021-09-23 ENCOUNTER — Other Ambulatory Visit: Payer: Self-pay | Admitting: Physician Assistant

## 2021-09-23 ENCOUNTER — Other Ambulatory Visit (HOSPITAL_COMMUNITY): Payer: Self-pay

## 2021-09-23 DIAGNOSIS — N1831 Chronic kidney disease, stage 3a: Secondary | ICD-10-CM

## 2021-09-23 DIAGNOSIS — E6609 Other obesity due to excess calories: Secondary | ICD-10-CM

## 2021-09-23 MED ORDER — DOXYCYCLINE HYCLATE 100 MG PO TABS
100.0000 mg | ORAL_TABLET | Freq: Two times a day (BID) | ORAL | 0 refills | Status: DC
  Filled 2021-09-23: qty 20, 10d supply, fill #0

## 2021-09-23 MED ORDER — OZEMPIC (2 MG/DOSE) 8 MG/3ML ~~LOC~~ SOPN
2.0000 mg | PEN_INJECTOR | SUBCUTANEOUS | 2 refills | Status: DC
  Filled 2021-09-23 (×2): qty 3, 28d supply, fill #0
  Filled 2021-09-23: qty 3, 30d supply, fill #0
  Filled 2021-09-23: qty 3, 28d supply, fill #0
  Filled 2021-09-24: qty 9, 90d supply, fill #0

## 2021-09-24 ENCOUNTER — Other Ambulatory Visit: Payer: Self-pay | Admitting: Physician Assistant

## 2021-09-24 ENCOUNTER — Other Ambulatory Visit (HOSPITAL_COMMUNITY): Payer: Self-pay

## 2021-09-24 DIAGNOSIS — E6609 Other obesity due to excess calories: Secondary | ICD-10-CM

## 2021-09-24 DIAGNOSIS — E1122 Type 2 diabetes mellitus with diabetic chronic kidney disease: Secondary | ICD-10-CM

## 2021-09-24 DIAGNOSIS — E66811 Obesity, class 1: Secondary | ICD-10-CM

## 2021-09-24 MED ORDER — OZEMPIC (2 MG/DOSE) 8 MG/3ML ~~LOC~~ SOPN
2.0000 mg | PEN_INJECTOR | SUBCUTANEOUS | 0 refills | Status: DC
  Filled 2021-09-24 – 2021-09-25 (×2): qty 9, 90d supply, fill #0

## 2021-09-24 MED ORDER — METFORMIN HCL 1000 MG PO TABS
1000.0000 mg | ORAL_TABLET | Freq: Two times a day (BID) | ORAL | 0 refills | Status: DC
  Filled 2021-09-24: qty 180, 90d supply, fill #0

## 2021-09-24 MED FILL — Gabapentin Cap 300 MG: ORAL | 30 days supply | Qty: 60 | Fill #0 | Status: AC

## 2021-09-25 ENCOUNTER — Other Ambulatory Visit (HOSPITAL_COMMUNITY): Payer: Self-pay

## 2021-10-05 ENCOUNTER — Encounter: Payer: Self-pay | Admitting: Physician Assistant

## 2021-10-05 ENCOUNTER — Other Ambulatory Visit (HOSPITAL_COMMUNITY): Payer: Self-pay

## 2021-10-05 ENCOUNTER — Ambulatory Visit (INDEPENDENT_AMBULATORY_CARE_PROVIDER_SITE_OTHER): Payer: Medicare Other | Admitting: Physician Assistant

## 2021-10-05 VITALS — BP 126/62 | HR 78 | Ht 66.0 in | Wt 206.0 lb

## 2021-10-05 DIAGNOSIS — M5030 Other cervical disc degeneration, unspecified cervical region: Secondary | ICD-10-CM | POA: Diagnosis not present

## 2021-10-05 DIAGNOSIS — I10 Essential (primary) hypertension: Secondary | ICD-10-CM | POA: Diagnosis not present

## 2021-10-05 DIAGNOSIS — E66811 Obesity, class 1: Secondary | ICD-10-CM

## 2021-10-05 DIAGNOSIS — E6609 Other obesity due to excess calories: Secondary | ICD-10-CM | POA: Diagnosis not present

## 2021-10-05 DIAGNOSIS — Z716 Tobacco abuse counseling: Secondary | ICD-10-CM

## 2021-10-05 DIAGNOSIS — G44209 Tension-type headache, unspecified, not intractable: Secondary | ICD-10-CM

## 2021-10-05 DIAGNOSIS — R0789 Other chest pain: Secondary | ICD-10-CM | POA: Diagnosis not present

## 2021-10-05 DIAGNOSIS — L84 Corns and callosities: Secondary | ICD-10-CM

## 2021-10-05 DIAGNOSIS — E1122 Type 2 diabetes mellitus with diabetic chronic kidney disease: Secondary | ICD-10-CM

## 2021-10-05 DIAGNOSIS — N1831 Chronic kidney disease, stage 3a: Secondary | ICD-10-CM | POA: Diagnosis not present

## 2021-10-05 DIAGNOSIS — F330 Major depressive disorder, recurrent, mild: Secondary | ICD-10-CM

## 2021-10-05 DIAGNOSIS — Z6833 Body mass index (BMI) 33.0-33.9, adult: Secondary | ICD-10-CM

## 2021-10-05 LAB — POCT GLYCOSYLATED HEMOGLOBIN (HGB A1C): Hemoglobin A1C: 5.6 % (ref 4.0–5.6)

## 2021-10-05 MED ORDER — OMEPRAZOLE 40 MG PO CPDR
40.0000 mg | DELAYED_RELEASE_CAPSULE | Freq: Every day | ORAL | 3 refills | Status: DC
  Filled 2021-10-05: qty 30, 30d supply, fill #0
  Filled 2021-11-18: qty 30, 30d supply, fill #1

## 2021-10-05 MED ORDER — BACLOFEN 10 MG PO TABS
10.0000 mg | ORAL_TABLET | Freq: Every evening | ORAL | 0 refills | Status: DC | PRN
  Filled 2021-10-05: qty 30, 30d supply, fill #0

## 2021-10-05 MED ORDER — OZEMPIC (2 MG/DOSE) 8 MG/3ML ~~LOC~~ SOPN
2.0000 mg | PEN_INJECTOR | SUBCUTANEOUS | 1 refills | Status: DC
  Filled 2021-10-05 – 2021-11-18 (×2): qty 9, 90d supply, fill #0
  Filled 2021-11-24: qty 9, 84d supply, fill #0

## 2021-10-05 MED ORDER — HYDROCHLOROTHIAZIDE 25 MG PO TABS
ORAL_TABLET | ORAL | 1 refills | Status: DC
  Filled 2021-10-05: qty 90, 90d supply, fill #0

## 2021-10-05 MED ORDER — INSULIN DEGLUDEC 100 UNIT/ML ~~LOC~~ SOPN
PEN_INJECTOR | SUBCUTANEOUS | 0 refills | Status: DC
  Filled 2021-10-05: qty 9, 69d supply, fill #0

## 2021-10-05 MED ORDER — BUPROPION HCL ER (SR) 200 MG PO TB12
200.0000 mg | ORAL_TABLET | Freq: Two times a day (BID) | ORAL | 1 refills | Status: DC
  Filled 2021-10-05: qty 180, 90d supply, fill #0

## 2021-10-05 MED ORDER — TOPIRAMATE 25 MG PO TABS
25.0000 mg | ORAL_TABLET | Freq: Two times a day (BID) | ORAL | 1 refills | Status: DC
  Filled 2021-10-05: qty 180, 90d supply, fill #0

## 2021-10-05 MED ORDER — METFORMIN HCL 1000 MG PO TABS
1000.0000 mg | ORAL_TABLET | Freq: Two times a day (BID) | ORAL | 1 refills | Status: DC
  Filled 2021-10-05: qty 180, 90d supply, fill #0

## 2021-10-05 MED ORDER — PHENTERMINE HCL 37.5 MG PO TABS
37.5000 mg | ORAL_TABLET | Freq: Every morning | ORAL | 0 refills | Status: DC
  Filled 2021-10-05: qty 30, 30d supply, fill #0
  Filled 2021-11-18: qty 30, 30d supply, fill #1

## 2021-10-05 MED ORDER — GABAPENTIN 300 MG PO CAPS
ORAL_CAPSULE | ORAL | 3 refills | Status: DC
  Filled 2021-10-05 – 2021-10-16 (×2): qty 60, 30d supply, fill #0
  Filled 2021-11-18: qty 60, 30d supply, fill #1

## 2021-10-05 NOTE — Progress Notes (Unsigned)
Established Patient Office Visit  Subjective   Patient ID: Ling Flesch, female    DOB: [birth year]  Age: 56 y.o. MRN: [id number]  Chief Complaint  Patient presents with   Follow-up    HPI Patient is a 56 year old obese female with type 2 diabetes, hypertension, hidradenitis suppurativa, hyperlipidemia who presents to the clinic for 58-month follow-up.  Patient has recently gotten disability and on Medicare.  She is having all of her medications sent to [facility].  She can now do some of her health maintenance as well.  She is checking her sugars with the Dexcom.  They are running around 100-130 in the mornings.  She is still on Lantus 14 units at bedtime.  She would like to switch back to [place] now that she has Medicare.  She is also on metformin and Ozempic.  She is trying to get to a healthy weight.  She continues to have a lot of cervical neck pain from her degenerative disc.  She plans to follow-up with neurosurgeon now that she has disability.  She does report a lot of headaches that stem from her neck pain and discomfort.  She has known history Nyda suppurativa.  She is needed a procedure but was waiting on insurance.  Pt does continue to smoke but would like an increase of Wellbutrin or something to help with smoking cessation.  She is more motivated to smoke now that her anxiety and stress levels are better since she has more financial help with medicines and procedures.  Patient has a painful corn of her right foot she would like referral to dermatology.  Patient does mention some chest discomfort that has been occurring for the last month at night.  Does not happen during the day or with exertion.  Sitting up usually relieves it.  She denies any acid reflux symptoms.  She denies any melena or hematochezia.  She denies any epigastric pain.  .. Active Ambulatory Problems    Diagnosis Date Noted   Tobacco dependence 05/31/2014   Abnormal weight gain 05/31/2014    Obese 05/31/2014   Essential hypertension, benign 05/31/2014   Diabetes mellitus without complication (HCC) 05/31/2014   Menopausal symptoms 05/31/2014   Hidradenitis suppurativa 12/13/2014   CKD (chronic kidney disease) stage 3, GFR 30-59 ml/min (HCC) 12/16/2014   Class 1 obesity due to excess calories with serious comorbidity and body mass index (BMI) of 34.0 to 34.9 in adult 03/21/2015   Depression 03/21/2015   Microalbuminuria due to type 2 diabetes mellitus (HCC) 08/25/2015   Allergic rhinitis due to allergen 08/08/2017   Upper back pain 05/19/2018   Benign paroxysmal positional vertigo due to bilateral vestibular disorder 05/19/2018   Mixed hyperlipidemia 05/22/2018   Dyslipidemia, goal LDL below 70 05/22/2018   DDD (degenerative disc disease), cervical 04/04/2019   Type 2 diabetes mellitus with chronic kidney disease, without long-term current use of insulin (HCC) 06/25/2019   Acute pain of both knees 06/25/2019   Bloating 11/20/2019   Epigastric pain 11/20/2019   Carpal tunnel syndrome, bilateral 11/23/2019   Demyelinating disease of central nervous system (HCC) 11/26/2019   White matter abnormality on MRI of brain 01/15/2020   Numbness 01/15/2020   Abscess of right axilla 04/18/2020   Abnormal MRI, cervical spine 04/30/2020   Moderate recurrent major depression (HCC) 11/29/2020   RLS (restless legs syndrome) 03/26/2021   Resolved Ambulatory Problems    Diagnosis Date Noted   Cervical radiculopathy 04/09/2019   Cervical pain (neck) 11/20/2019  Past Medical History:  Diagnosis Date   Chronic kidney disease    Hypertension      ROS See HPI.    Objective:     BP 126/62   Pulse 78   Ht 5\' 6"  (1.676 m)   Wt 206 lb (93.4 kg)   SpO2 98%   BMI 33.25 kg/m  BP Readings from Last 3 Encounters:  10/05/21 126/62  08/14/21 (!) 152/79  07/07/21 129/72   Wt Readings from Last 3 Encounters:  10/05/21 206 lb (93.4 kg)  07/07/21 205 lb (93 kg)  03/25/21 205 lb (93  kg)      Physical Exam Vitals reviewed.  Constitutional:      Appearance: Normal appearance. She is obese.  HENT:     Head: Normocephalic.  Neck:     Vascular: No carotid bruit.  Cardiovascular:     Rate and Rhythm: Normal rate and regular rhythm.     Pulses: Normal pulses.     Heart sounds: Normal heart sounds.  Pulmonary:     Effort: Pulmonary effort is normal.     Breath sounds: Normal breath sounds.  Musculoskeletal:     Right lower leg: No edema.     Left lower leg: No edema.  Lymphadenopathy:     Cervical: No cervical adenopathy.  Skin:    Comments: Painful hyperkeratotic are of RIGHT plantar 3rd metatarsal about dime size  Neurological:     General: No focal deficit present.     Mental Status: She is alert and oriented to person, place, and time.  Psychiatric:        Mood and Affect: Mood normal.   ..    07/07/2021   11:01 AM 11/28/2020    1:48 PM 09/24/2020    2:00 PM 02/18/2020    1:53 PM 04/04/2019   11:39 AM  Depression screen PHQ 2/9  Decreased Interest 3 3  2 2   Down, Depressed, Hopeless 2 3  1 1   PHQ - 2 Score 5 6  3 3   Altered sleeping 3 3  3 1   Tired, decreased energy 2 2  2 2   Change in appetite 2 2  2 2   Feeling bad or failure about yourself  3 3  1  0  Trouble concentrating 2 2  2 2   Moving slowly or fidgety/restless 2 2  0 2  Suicidal thoughts 0 1  0 0  PHQ-9 Score 19 21  13 12   Difficult doing work/chores Very difficult Very difficult Very difficult Somewhat difficult Not difficult at all   ..    07/07/2021   11:01 AM 11/28/2020    1:48 PM 02/18/2020    1:53 PM 04/04/2019   11:39 AM  GAD 7 : Generalized Anxiety Score  Nervous, Anxious, on Edge 2 2 1  0  Control/stop worrying 2 3 0 0  Worry too much - different things 2 3 0 1  Trouble relaxing 3 2 2  0  Restless 3 2 1 1   Easily annoyed or irritable 2 2 1 1   Afraid - awful might happen 0 2 0 0  Total GAD 7 Score 14 16 5 3   Anxiety Difficulty Very difficult Somewhat difficult Somewhat  difficult Somewhat difficult      Results for orders placed or performed in visit on 10/05/21  COMPLETE METABOLIC PANEL WITH GFR  Result Value Ref Range   Glucose, Bld 79 65 - 99 mg/dL   BUN 18 7 - 25 mg/dL   Creat 1.38 (H)  0.50 - 1.03 mg/dL   eGFR 45 (L) > OR = 60 mL/min/1.61m2   BUN/Creatinine Ratio 13 6 - 22 (calc)   Sodium 143 135 - 146 mmol/L   Potassium 4.3 3.5 - 5.3 mmol/L   Chloride 109 98 - 110 mmol/L   CO2 30 20 - 32 mmol/L   Calcium 10.0 8.6 - 10.4 mg/dL   Total Protein 7.1 6.1 - 8.1 g/dL   Albumin 4.1 3.6 - 5.1 g/dL   Globulin 3.0 1.9 - 3.7 g/dL (calc)   AG Ratio 1.4 1.0 - 2.5 (calc)   Total Bilirubin 0.3 0.2 - 1.2 mg/dL   Alkaline phosphatase (APISO) 72 37 - 153 U/L   AST 17 10 - 35 U/L   ALT 12 6 - 29 U/L  POCT glycosylated hemoglobin (Hb A1C)  Result Value Ref Range   Hemoglobin A1C 5.6 4.0 - 5.6 %   HbA1c POC (<> result, manual entry)     HbA1c, POC (prediabetic range)     HbA1c, POC (controlled diabetic range)         Assessment & Plan:  Marland KitchenMarland KitchenJernee was seen today for follow-up.  Diagnoses and all orders for this visit:  Type 2 diabetes mellitus with stage 3a chronic kidney disease, without long-term current use of insulin (HCC) -     COMPLETE METABOLIC PANEL WITH GFR -     POCT glycosylated hemoglobin (Hb A1C) -     Semaglutide, 2 MG/DOSE, (OZEMPIC, 2 MG/DOSE,) 8 MG/3ML SOPN; Inject 2 mg into the skin once a week.  Essential hypertension, benign -     COMPLETE METABOLIC PANEL WITH GFR -     hydrochlorothiazide (HYDRODIURIL) 25 MG tablet; Take 1 tablet (25 mg total) by mouth at bedtime  Class 1 obesity due to excess calories with serious comorbidity and body mass index (BMI) of 33.0 to 33.9 in adult -     Semaglutide, 2 MG/DOSE, (OZEMPIC, 2 MG/DOSE,) 8 MG/3ML SOPN; Inject 2 mg into the skin once a week. -     topiramate (TOPAMAX) 25 MG tablet; Take 1 tablet (25 mg total) by mouth 2 (two) times daily. -     metFORMIN (GLUCOPHAGE) 1000 MG tablet; Take  1 tablet by mouth 2 times daily with a meal. -     buPROPion (WELLBUTRIN SR) 200 MG 12 hr tablet; Take 1 tablet by mouth 2 times daily. -     phentermine (ADIPEX-P) 37.5 MG tablet; Take 1 tablet (37.5 mg total) by mouth every morning.  Type 2 diabetes mellitus with chronic kidney disease, without long-term current use of insulin, unspecified CKD stage (HCC) -     metFORMIN (GLUCOPHAGE) 1000 MG tablet; Take 1 tablet by mouth 2 times daily with a meal. -     insulin degludec (TRESIBA) 100 UNIT/ML FlexTouch Pen; Inject 13 units into the skin at bedtime  DDD (degenerative disc disease), cervical -     gabapentin (NEURONTIN) 300 MG capsule; Take 1 capsule by mouth in the evening and 1 capsule at bedtime  Mild episode of recurrent major depressive disorder (HCC)  Corn of foot -     Ambulatory referral to Podiatry  Chest tightness -     omeprazole (PRILOSEC) 40 MG capsule; Take 1 capsule (40 mg total) by mouth daily.  Encounter for smoking cessation counseling -     buPROPion (WELLBUTRIN SR) 200 MG 12 hr tablet; Take 1 tablet by mouth 2 times daily.  Tension headache -     baclofen (LIORESAL) 10  MG tablet; Take 1 tablet (10 mg total) by mouth at bedtime as needed for muscle spasms.   A1C looks great Continue on ozempic, metformin and switched insulin to tresbia.  Watch morning glucose if under 100 start cutting back on tresbia BP to goal, cmp ordered today On statin Needs eye exam and has scheduled Foot exam UTD Needs to get shingles vaccine at pharmacy Covid vaccine x4 UTD.  Flu and pneumonia UTD.   Painful corn-referral to podiatry  Pt is having some night time chest discomfort Suspect GERD from ozempic Start omeprazole daily No exertional CP EKG done today showed twave changes but no ST elevation or significant changes from last EKG If symptoms continue consider stress test  Wellbutrin sent increased to 200 bid Discussed importance of smoking cessation  Make appt with  dermatology to have treatments on hidradenitis suppurativa.   Working on [employer]. BP to goal. Refilled phentermine at patients request.   Follow up in 3 months.      Iran Planas, PA-C

## 2021-10-05 NOTE — Patient Instructions (Addendum)
Get shingles shot at pharmacy Get CMP done today Podiatry referral place Start omeprazole daily in the morning Scheduled eye exam Shingles shot at pharmacy

## 2021-10-06 LAB — COMPLETE METABOLIC PANEL WITH GFR
AG Ratio: 1.4 (calc) (ref 1.0–2.5)
ALT: 12 U/L (ref 6–29)
AST: 17 U/L (ref 10–35)
Albumin: 4.1 g/dL (ref 3.6–5.1)
Alkaline phosphatase (APISO): 72 U/L (ref 37–153)
BUN/Creatinine Ratio: 13 (calc) (ref 6–22)
BUN: 18 mg/dL (ref 7–25)
CO2: 30 mmol/L (ref 20–32)
Calcium: 10 mg/dL (ref 8.6–10.4)
Chloride: 109 mmol/L (ref 98–110)
Creat: 1.38 mg/dL — ABNORMAL HIGH (ref 0.50–1.03)
Globulin: 3 g/dL (calc) (ref 1.9–3.7)
Glucose, Bld: 79 mg/dL (ref 65–99)
Potassium: 4.3 mmol/L (ref 3.5–5.3)
Sodium: 143 mmol/L (ref 135–146)
Total Bilirubin: 0.3 mg/dL (ref 0.2–1.2)
Total Protein: 7.1 g/dL (ref 6.1–8.1)
eGFR: 45 mL/min/{1.73_m2} — ABNORMAL LOW (ref >=60)

## 2021-10-06 NOTE — Progress Notes (Signed)
Hi Nina Trujillo, [name] is out of the office today.  Your kidney function is stable on your labs.  Just a reminder it looks like you are due for your yearly eye exam.  If you have already had it done somewhere then please let us know as would like to update your chart.

## 2021-10-07 ENCOUNTER — Other Ambulatory Visit (HOSPITAL_BASED_OUTPATIENT_CLINIC_OR_DEPARTMENT_OTHER): Payer: Self-pay

## 2021-10-07 NOTE — Addendum Note (Signed)
Addended by: Narda Rutherford on: 10/07/2021 07:50 AM   Modules accepted: Orders

## 2021-10-14 ENCOUNTER — Other Ambulatory Visit (HOSPITAL_COMMUNITY): Payer: Self-pay

## 2021-10-16 ENCOUNTER — Other Ambulatory Visit (HOSPITAL_COMMUNITY): Payer: Self-pay

## 2021-10-24 ENCOUNTER — Other Ambulatory Visit (HOSPITAL_COMMUNITY): Payer: Self-pay

## 2021-10-24 ENCOUNTER — Other Ambulatory Visit: Payer: Self-pay | Admitting: Physician Assistant

## 2021-10-24 DIAGNOSIS — E1122 Type 2 diabetes mellitus with diabetic chronic kidney disease: Secondary | ICD-10-CM

## 2021-10-26 ENCOUNTER — Other Ambulatory Visit (HOSPITAL_COMMUNITY): Payer: Self-pay

## 2021-10-26 ENCOUNTER — Telehealth: Payer: Self-pay

## 2021-10-26 MED ORDER — INSULIN DEGLUDEC 100 UNIT/ML ~~LOC~~ SOPN
PEN_INJECTOR | SUBCUTANEOUS | 0 refills | Status: DC
  Filled 2021-10-26 – 2021-11-18 (×2): qty 9, 69d supply, fill #0

## 2021-10-26 NOTE — Telephone Encounter (Signed)
PA

## 2021-10-26 NOTE — Telephone Encounter (Addendum)
Initiated Prior authorization [id number]) 100 UNIT/ML FlexTouch Pen  Via: Covermymeds Case/Key:[id number] Status: denied   as of 10/26/21 Reason:formulary exclusion  Notified Pt via: Mychart

## 2021-10-28 ENCOUNTER — Other Ambulatory Visit (HOSPITAL_COMMUNITY): Payer: Self-pay

## 2021-10-28 MED ORDER — TOUJEO MAX SOLOSTAR 300 UNIT/ML ~~LOC~~ SOPN
13.0000 [IU] | PEN_INJECTOR | Freq: Every day | SUBCUTANEOUS | 1 refills | Status: DC
  Filled 2021-10-28 – 2021-10-29 (×2): qty 3, 69d supply, fill #0
  Filled 2021-11-24: qty 3, 56d supply, fill #0
  Filled 2021-11-26: qty 3, 28d supply, fill #0

## 2021-10-28 NOTE — Addendum Note (Signed)
Addended by: Donella Stade on: 10/28/2021 01:59 PM   Modules accepted: Orders

## 2021-10-28 NOTE — Telephone Encounter (Signed)
PA denied, please advise.

## 2021-10-28 NOTE — Telephone Encounter (Signed)
Sent toujeo same units 13 at bedtime.

## 2021-10-29 ENCOUNTER — Other Ambulatory Visit (HOSPITAL_COMMUNITY): Payer: Self-pay

## 2021-10-30 ENCOUNTER — Other Ambulatory Visit (HOSPITAL_COMMUNITY): Payer: Self-pay

## 2021-11-02 ENCOUNTER — Ambulatory Visit: Payer: Medicaid Other | Admitting: Neurology

## 2021-11-13 ENCOUNTER — Ambulatory Visit: Payer: Medicare Other | Admitting: Podiatry

## 2021-11-18 ENCOUNTER — Other Ambulatory Visit (HOSPITAL_COMMUNITY): Payer: Self-pay

## 2021-11-18 MED FILL — Meloxicam Tab 15 MG: ORAL | 30 days supply | Qty: 30 | Fill #0 | Status: AC

## 2021-11-23 ENCOUNTER — Other Ambulatory Visit (HOSPITAL_COMMUNITY): Payer: Self-pay

## 2021-11-24 ENCOUNTER — Other Ambulatory Visit: Payer: Self-pay

## 2021-11-24 ENCOUNTER — Encounter: Payer: Self-pay | Admitting: Physician Assistant

## 2021-11-24 ENCOUNTER — Other Ambulatory Visit (HOSPITAL_COMMUNITY): Payer: Self-pay

## 2021-11-24 DIAGNOSIS — E6609 Other obesity due to excess calories: Secondary | ICD-10-CM

## 2021-11-24 DIAGNOSIS — E1122 Type 2 diabetes mellitus with diabetic chronic kidney disease: Secondary | ICD-10-CM

## 2021-11-24 MED ORDER — OZEMPIC (2 MG/DOSE) 8 MG/3ML ~~LOC~~ SOPN
2.0000 mg | PEN_INJECTOR | SUBCUTANEOUS | 1 refills | Status: DC
  Filled 2021-11-24: qty 9, 90d supply, fill #0
  Filled 2021-11-26: qty 3, 28d supply, fill #0

## 2021-11-24 MED ORDER — INSULIN DEGLUDEC 100 UNIT/ML ~~LOC~~ SOPN
PEN_INJECTOR | SUBCUTANEOUS | 0 refills | Status: DC
  Filled 2021-11-24: qty 9, 69d supply, fill #0

## 2021-11-24 NOTE — Telephone Encounter (Signed)
Task completed. Insulin Sample in the fridge labeled with patient info for distribution.

## 2021-11-25 ENCOUNTER — Other Ambulatory Visit (HOSPITAL_COMMUNITY): Payer: Self-pay

## 2021-11-26 ENCOUNTER — Other Ambulatory Visit (HOSPITAL_COMMUNITY): Payer: Self-pay

## 2021-11-27 ENCOUNTER — Ambulatory Visit: Payer: Medicare Other | Admitting: Podiatry

## 2021-11-27 ENCOUNTER — Other Ambulatory Visit (HOSPITAL_COMMUNITY): Payer: Self-pay

## 2021-12-03 ENCOUNTER — Ambulatory Visit: Payer: Medicare Other | Admitting: Podiatry

## 2021-12-03 ENCOUNTER — Other Ambulatory Visit (HOSPITAL_COMMUNITY): Payer: Self-pay

## 2021-12-03 ENCOUNTER — Other Ambulatory Visit: Payer: Self-pay

## 2021-12-03 DIAGNOSIS — E1122 Type 2 diabetes mellitus with diabetic chronic kidney disease: Secondary | ICD-10-CM

## 2021-12-03 MED ORDER — DEXCOM G6 SENSOR MISC
3 refills | Status: DC
  Filled 2021-12-03: qty 3, 30d supply, fill #0

## 2021-12-03 MED ORDER — DEXCOM G6 RECEIVER DEVI
1.0000 | Freq: Four times a day (QID) | 4 refills | Status: DC | PRN
  Filled 2021-12-03: qty 1, 90d supply, fill #0

## 2021-12-03 MED ORDER — DEXCOM G6 TRANSMITTER MISC
1 refills | Status: DC
  Filled 2021-12-03: qty 1, 90d supply, fill #0

## 2021-12-07 LAB — HM DIABETES EYE EXAM

## 2021-12-10 ENCOUNTER — Ambulatory Visit (INDEPENDENT_AMBULATORY_CARE_PROVIDER_SITE_OTHER): Payer: Self-pay | Admitting: Podiatry

## 2021-12-10 DIAGNOSIS — Z91199 Patient's noncompliance with other medical treatment and regimen due to unspecified reason: Secondary | ICD-10-CM

## 2021-12-10 NOTE — Progress Notes (Signed)
No show

## 2022-01-04 ENCOUNTER — Encounter: Payer: Self-pay | Admitting: Physician Assistant

## 2022-01-04 ENCOUNTER — Other Ambulatory Visit (HOSPITAL_COMMUNITY): Payer: Self-pay

## 2022-01-04 ENCOUNTER — Ambulatory Visit (INDEPENDENT_AMBULATORY_CARE_PROVIDER_SITE_OTHER): Payer: PRIVATE HEALTH INSURANCE | Admitting: Physician Assistant

## 2022-01-04 VITALS — BP 123/64 | HR 114 | Ht 66.0 in | Wt 195.0 lb

## 2022-01-04 DIAGNOSIS — Z6833 Body mass index (BMI) 33.0-33.9, adult: Secondary | ICD-10-CM

## 2022-01-04 DIAGNOSIS — R7989 Other specified abnormal findings of blood chemistry: Secondary | ICD-10-CM

## 2022-01-04 DIAGNOSIS — N1831 Chronic kidney disease, stage 3a: Secondary | ICD-10-CM

## 2022-01-04 DIAGNOSIS — E785 Hyperlipidemia, unspecified: Secondary | ICD-10-CM | POA: Diagnosis not present

## 2022-01-04 DIAGNOSIS — R112 Nausea with vomiting, unspecified: Secondary | ICD-10-CM

## 2022-01-04 DIAGNOSIS — E1122 Type 2 diabetes mellitus with diabetic chronic kidney disease: Secondary | ICD-10-CM

## 2022-01-04 DIAGNOSIS — I10 Essential (primary) hypertension: Secondary | ICD-10-CM | POA: Diagnosis not present

## 2022-01-04 DIAGNOSIS — E6609 Other obesity due to excess calories: Secondary | ICD-10-CM | POA: Diagnosis not present

## 2022-01-04 DIAGNOSIS — R944 Abnormal results of kidney function studies: Secondary | ICD-10-CM

## 2022-01-04 MED ORDER — ONDANSETRON 8 MG PO TBDP
8.0000 mg | ORAL_TABLET | Freq: Three times a day (TID) | ORAL | 1 refills | Status: DC | PRN
  Filled 2022-01-04: qty 20, 7d supply, fill #0

## 2022-01-04 NOTE — Progress Notes (Signed)
Established Patient Office Visit  Subjective   Patient ID: Nina Trujillo, female    DOB: 07-12-1965  Age: 56 y.o. MRN: [id number]  Chief Complaint  Patient presents with   Follow-up    HPI Pt is a 56 yo obese female with T2DM, HTN, microalbuminuria, HLD, MDD who presents to the clinic for follow up.   Her insulin had to be switched from [place] to [facility] and she has not done well with switch. She has noticed her sugars are more elevated throughout the day. She kept the same units. She has had nausea and vomiting as well with some epigastric discomfort but suspected that was due to being completely out of insulin for a few days. No fever, chills, body aches. No other new medications other than increased ozempic to $RemoveBe'2mg'jKWtnUBQj$  a few months ago.   .. Active Ambulatory Problems    Diagnosis Date Noted   Tobacco dependence 05/31/2014   Abnormal weight gain 05/31/2014   Obese 05/31/2014   Essential hypertension, benign 05/31/2014   Diabetes mellitus without complication ([place]) 37/02/6268   Menopausal symptoms 05/31/2014   Hidradenitis suppurativa 12/13/2014   CKD (chronic kidney disease) stage 3, GFR 30-59 ml/min (HCC) 12/16/2014   Class 1 obesity due to excess calories with serious comorbidity and body mass index (BMI) of 34.0 to 34.9 in adult 03/21/2015   Depression 03/21/2015   Microalbuminuria due to type 2 diabetes mellitus ([place]) 08/25/2015   Allergic rhinitis due to allergen 08/08/2017   Upper back pain 05/19/2018   Benign paroxysmal positional vertigo due to bilateral vestibular disorder 05/19/2018   Mixed hyperlipidemia 05/22/2018   Dyslipidemia, goal LDL below 70 05/22/2018   DDD (degenerative disc disease), cervical 04/04/2019   Type 2 diabetes mellitus with chronic kidney disease, without long-term current use of insulin ([place]) 06/25/2019   Acute pain of both knees 06/25/2019   Bloating 11/20/2019   Epigastric pain 11/20/2019   Carpal tunnel syndrome, bilateral 11/23/2019    Demyelinating disease of central nervous system ([place]) 11/26/2019   White matter abnormality on MRI of brain 01/15/2020   Numbness 01/15/2020   Abscess of right axilla 04/18/2020   Abnormal MRI, cervical spine 04/30/2020   Moderate recurrent major depression ([place]) 11/29/2020   RLS (restless legs syndrome) 03/26/2021   Hypomagnesemia 01/04/2022   Decreased GFR 01/11/2022   Elevated serum creatinine 01/11/2022   Resolved Ambulatory Problems    Diagnosis Date Noted   Cervical radiculopathy 04/09/2019   Cervical pain (neck) 11/20/2019   Past Medical History:  Diagnosis Date   Chronic kidney disease    Hypertension      ROS See HPI.    Objective:     BP 123/64   Pulse (!) 114   Ht $R'5\' 6"'Jp$  (1.676 m)   Wt 195 lb (88.5 kg)   SpO2 98%   BMI 31.47 kg/m  BP Readings from Last 3 Encounters:  01/04/22 123/64  10/05/21 126/62  08/14/21 (!) 152/79   Wt Readings from Last 3 Encounters:  01/04/22 195 lb (88.5 kg)  10/05/21 206 lb (93.4 kg)  07/07/21 205 lb (93 kg)    .[name] Results for orders placed or performed in visit on 01/04/22  Magnesium  Result Value Ref Range   Magnesium 1.8 1.5 - 2.5 mg/dL  COMPLETE METABOLIC PANEL WITH GFR  Result Value Ref Range   Glucose, Bld 146 (H) 65 - 99 mg/dL   BUN 31 (H) 7 - 25 mg/dL   Creat 1.94 (H) 0.50 - 1.03 mg/dL  eGFR 29 (L) > OR = 60 mL/min/1.56m2   BUN/Creatinine Ratio 16 6 - 22 (calc)   Sodium 140 135 - 146 mmol/L   Potassium 4.0 3.5 - 5.3 mmol/L   Chloride 103 98 - 110 mmol/L   CO2 27 20 - 32 mmol/L   Calcium 10.5 (H) 8.6 - 10.4 mg/dL   Total Protein 7.7 6.1 - 8.1 g/dL   Albumin 4.6 3.6 - 5.1 g/dL   Globulin 3.1 1.9 - 3.7 g/dL (calc)   AG Ratio 1.5 1.0 - 2.5 (calc)   Total Bilirubin 0.4 0.2 - 1.2 mg/dL   Alkaline phosphatase (APISO) 87 37 - 153 U/L   AST 16 10 - 35 U/L   ALT 12 6 - 29 U/L  Lipase  Result Value Ref Range   Lipase 42 7 - 60 U/L     Physical Exam      Assessment & Plan:  Marland KitchenMarland KitchenLessa was seen today  for follow-up.  Diagnoses and all orders for this visit:  Type 2 diabetes mellitus with stage 3a chronic kidney disease, without long-term current use of insulin (HCC)  Class 1 obesity due to excess calories with serious comorbidity and body mass index (BMI) of 33.0 to 33.9 in adult  Essential hypertension, benign  Dyslipidemia, goal LDL below 70  Nausea and vomiting, unspecified vomiting type -     Magnesium -     COMPLETE METABOLIC PANEL WITH GFR -     ondansetron (ZOFRAN-ODT) 8 MG disintegrating tablet; Allow 1 tablet to dissolve by mouth every 8 hours as needed for nausea. -     Lipase  Hypomagnesemia  Elevated serum creatinine -     US Renal; Future  Decreased GFR -     US Renal; Future   Repeat kidney function Continue to monitor sugars Lipase/CBC/CmP recheck Decrease down to $Remov'1mg'uMxQVq$  of ozempic weekly Zofran as needed Follow up in 1 month  GFR continues to decline-renal ultrasound ordered  Iran Planas, PA-C

## 2022-01-04 NOTE — Patient Instructions (Signed)
Decrease down to '1mg'$  of ozempic weekly for 1 month Zofran as needed for nausea

## 2022-01-05 LAB — COMPLETE METABOLIC PANEL WITH GFR
AG Ratio: 1.5 (calc) (ref 1.0–2.5)
ALT: 12 U/L (ref 6–29)
AST: 16 U/L (ref 10–35)
Albumin: 4.6 g/dL (ref 3.6–5.1)
Alkaline phosphatase (APISO): 87 U/L (ref 37–153)
BUN/Creatinine Ratio: 16 (calc) (ref 6–22)
BUN: 31 mg/dL — ABNORMAL HIGH (ref 7–25)
CO2: 27 mmol/L (ref 20–32)
Calcium: 10.5 mg/dL — ABNORMAL HIGH (ref 8.6–10.4)
Chloride: 103 mmol/L (ref 98–110)
Creat: 1.94 mg/dL — ABNORMAL HIGH (ref 0.50–1.03)
Globulin: 3.1 g/dL (calc) (ref 1.9–3.7)
Glucose, Bld: 146 mg/dL — ABNORMAL HIGH (ref 65–99)
Potassium: 4 mmol/L (ref 3.5–5.3)
Sodium: 140 mmol/L (ref 135–146)
Total Bilirubin: 0.4 mg/dL (ref 0.2–1.2)
Total Protein: 7.7 g/dL (ref 6.1–8.1)
eGFR: 29 mL/min/{1.73_m2} — ABNORMAL LOW (ref >=60)

## 2022-01-05 LAB — MAGNESIUM: Magnesium: 1.8 mg/dL (ref 1.5–2.5)

## 2022-01-05 LAB — LIPASE: Lipase: 42 U/L (ref 7–60)

## 2022-01-05 NOTE — Progress Notes (Signed)
Crescentia,   Your kidney function continues to decrease.  Stop any anti-inflammatories such as ibuprofen, advil, mobic, naproxen, aleve.  We need to get renal ultrasound are you ok with ordering Repeat BMP in 4 weeks.  Magnesium is normal.  Lipase your pancreatic enzyme is normal.

## 2022-01-06 ENCOUNTER — Encounter: Payer: Self-pay | Admitting: General Practice

## 2022-01-11 ENCOUNTER — Encounter: Payer: Self-pay | Admitting: Physician Assistant

## 2022-01-11 ENCOUNTER — Encounter: Payer: PRIVATE HEALTH INSURANCE | Admitting: Physician Assistant

## 2022-01-11 DIAGNOSIS — R944 Abnormal results of kidney function studies: Secondary | ICD-10-CM | POA: Insufficient documentation

## 2022-01-11 DIAGNOSIS — R7989 Other specified abnormal findings of blood chemistry: Secondary | ICD-10-CM | POA: Insufficient documentation

## 2022-01-13 ENCOUNTER — Telehealth: Payer: Self-pay | Admitting: General Practice
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
## 2022-01-14 ENCOUNTER — Other Ambulatory Visit: Payer: PRIVATE HEALTH INSURANCE

## 2022-01-19 ENCOUNTER — Ambulatory Visit (INDEPENDENT_AMBULATORY_CARE_PROVIDER_SITE_OTHER): Payer: PRIVATE HEALTH INSURANCE | Admitting: Physician Assistant

## 2022-01-19 ENCOUNTER — Ambulatory Visit (INDEPENDENT_AMBULATORY_CARE_PROVIDER_SITE_OTHER): Payer: Medicare (Managed Care)

## 2022-01-19 ENCOUNTER — Encounter: Payer: Self-pay | Admitting: Physician Assistant

## 2022-01-19 DIAGNOSIS — G8929 Other chronic pain: Secondary | ICD-10-CM | POA: Insufficient documentation

## 2022-01-19 DIAGNOSIS — Z23 Encounter for immunization: Secondary | ICD-10-CM | POA: Diagnosis not present

## 2022-01-19 DIAGNOSIS — M25512 Pain in left shoulder: Secondary | ICD-10-CM | POA: Diagnosis not present

## 2022-01-19 DIAGNOSIS — M25552 Pain in left hip: Secondary | ICD-10-CM | POA: Diagnosis not present

## 2022-01-19 DIAGNOSIS — R7989 Other specified abnormal findings of blood chemistry: Secondary | ICD-10-CM

## 2022-01-19 DIAGNOSIS — R944 Abnormal results of kidney function studies: Secondary | ICD-10-CM

## 2022-01-19 DIAGNOSIS — R251 Tremor, unspecified: Secondary | ICD-10-CM

## 2022-01-19 DIAGNOSIS — M5412 Radiculopathy, cervical region: Secondary | ICD-10-CM | POA: Diagnosis not present

## 2022-01-19 DIAGNOSIS — M5030 Other cervical disc degeneration, unspecified cervical region: Secondary | ICD-10-CM

## 2022-01-19 MED ORDER — KETOROLAC TROMETHAMINE 60 MG/2ML IM SOLN
60.0000 mg | Freq: Once | INTRAMUSCULAR | Status: AC
  Administered 2022-01-19: 60 mg via INTRAMUSCULAR

## 2022-01-19 MED ORDER — CYCLOBENZAPRINE HCL 10 MG PO TABS: 10.0000 mg | ORAL_TABLET | Freq: Three times a day (TID) | ORAL | 0 refills | Status: DC | PRN

## 2022-01-19 MED ORDER — PREDNISONE 50 MG PO TABS: ORAL_TABLET | ORAL | 0 refills | Status: DC

## 2022-01-19 NOTE — Patient Instructions (Signed)
Will get back in with PT Start prednisone for 5 days Flexeril as needed
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
## 2022-01-20 ENCOUNTER — Encounter: Payer: Self-pay | Admitting: Physician Assistant

## 2022-01-20 DIAGNOSIS — N3289 Other specified disorders of bladder: Secondary | ICD-10-CM | POA: Insufficient documentation

## 2022-01-20 NOTE — Progress Notes (Signed)
Evidence of renal disease in both kidneys. Small kidney cysts but do not need follow up.  Lets schedule a phone call to discuss this.   There is a complex cystic mass above bladder. We need to get more imaging to get a better look at it. Are you ok with ordering?

## 2022-01-21 ENCOUNTER — Encounter: Payer: Self-pay | Admitting: Rehabilitative and Restorative Service Providers"

## 2022-01-21 ENCOUNTER — Ambulatory Visit
Payer: Medicare (Managed Care) | Attending: Physician Assistant | Admitting: Rehabilitative and Restorative Service Providers"

## 2022-01-21 ENCOUNTER — Other Ambulatory Visit: Payer: Self-pay

## 2022-01-21 DIAGNOSIS — M542 Cervicalgia: Secondary | ICD-10-CM | POA: Insufficient documentation

## 2022-01-21 DIAGNOSIS — M25512 Pain in left shoulder: Secondary | ICD-10-CM | POA: Diagnosis not present

## 2022-01-21 DIAGNOSIS — R29818 Other symptoms and signs involving the nervous system: Secondary | ICD-10-CM | POA: Insufficient documentation

## 2022-01-21 DIAGNOSIS — M5412 Radiculopathy, cervical region: Secondary | ICD-10-CM | POA: Diagnosis not present

## 2022-01-21 DIAGNOSIS — R293 Abnormal posture: Secondary | ICD-10-CM | POA: Insufficient documentation

## 2022-01-21 DIAGNOSIS — M6281 Muscle weakness (generalized): Secondary | ICD-10-CM | POA: Insufficient documentation

## 2022-01-21 DIAGNOSIS — R29898 Other symptoms and signs involving the musculoskeletal system: Secondary | ICD-10-CM

## 2022-01-21 DIAGNOSIS — M25511 Pain in right shoulder: Secondary | ICD-10-CM | POA: Diagnosis not present

## 2022-01-21 DIAGNOSIS — M5030 Other cervical disc degeneration, unspecified cervical region: Secondary | ICD-10-CM | POA: Diagnosis not present
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
## 2022-01-21 NOTE — Patient Instructions (Signed)
Aquatic Therapy: What to Expect!  Where:  [facility] [address] [place], [place] [address] [phone]           How to Prepare:  If you require assistance with dressing, with transportation (ie: wheel chair), or toileting, a caregiver must attend the entire session with you (unless your primary therapists feels this is not necessary).   If there is thunder during your appointment, you will be asked to leave pool area. You have the option to finish your session in the physical therapy area near the gym. Masks in the pool area are optional. Your face will remain dry during your session, so you are welcome to keep your mask on, if desired. You will be spaced at least 6 feet from other aquatic patients.  Please bring your own swim towel to dry off with.   There are Men's and Women's locker rooms with showers, as well as gender neutral bathrooms in the pool area.  Please arrive IN YOUR SUIT and a few minutes prior to your appointment - this helps to avoid delays in starting your session. Head to the pool and await your appointment on the bench on the pool deck. Please make sure to attend to any toileting needs prior to entering the pool. Once on the pool deck your therapist will ask you to sign the Patient  Consent and Assignment of Benefits form. Your therapist may take your blood pressure prior to, during and after your session if indicated. We usually try and create a home exercise program based on activities we do in the pool. Some patients do not want to or do not have the ability to participate in an aquatic home program - this is not a barrier in any way to you participating in aquatic therapy as part of your current therapy plan!  Appointments:  All sessions are 45 minutes  About the pool: Entering the pool: Your therapist will assist you if needed; there are two ways to enter the pool - stairs or a mechanical lift. Your therapist will determine  the most appropriate way for you. Water temperature is usually around 91-95.  There is a lap pool with a temperature around 84 There may be other therapists and patients in the pool at the same time.   Contact Info:            To cancel appointment, please call [place] at [phone] If you are running late, please call [facility] Location and contact info description of services cost  [facility] [employer] [facility] [place], [place] [phone]  Aquatics, silver sneakers classes, yoga, exercise equipment $18/month for 55+ $25/month individual $40-$60/month family  [facility] [address] [place], [place] [phone] Transportation to appointments. Activities (stretching, dancing, Tai Chi)  Mostly free  [place] [address] [place], [place] [phone]  [facility] Fee based  healthwise exercise program Various locations [phone]  [place] exercise free  [facility] [facility] Various locations [phone] [place], [place], [place], Table tennis, chair volleyball, [place]  free  [facility] [place]. [place], [place]  [phone] Various activities (drum exercise, yoga, line dancing, etc)  free

## 2022-01-22 ENCOUNTER — Telehealth (INDEPENDENT_AMBULATORY_CARE_PROVIDER_SITE_OTHER): Payer: PRIVATE HEALTH INSURANCE | Admitting: Physician Assistant

## 2022-01-22 DIAGNOSIS — M25512 Pain in left shoulder: Secondary | ICD-10-CM

## 2022-01-22 DIAGNOSIS — M5412 Radiculopathy, cervical region: Secondary | ICD-10-CM

## 2022-01-22 DIAGNOSIS — M5030 Other cervical disc degeneration, unspecified cervical region: Secondary | ICD-10-CM

## 2022-01-22 DIAGNOSIS — R93429 Abnormal radiologic findings on diagnostic imaging of unspecified kidney: Secondary | ICD-10-CM | POA: Diagnosis not present

## 2022-01-22 DIAGNOSIS — N3289 Other specified disorders of bladder: Secondary | ICD-10-CM

## 2022-01-22 DIAGNOSIS — N1831 Chronic kidney disease, stage 3a: Secondary | ICD-10-CM

## 2022-01-22 MED ORDER — TRAMADOL HCL 50 MG PO TABS: 50.0000 mg | ORAL_TABLET | Freq: Four times a day (QID) | ORAL | 0 refills | Status: AC | PRN
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
## 2022-01-25 ENCOUNTER — Other Ambulatory Visit (HOSPITAL_COMMUNITY): Payer: Self-pay

## 2022-01-26 ENCOUNTER — Ambulatory Visit (INDEPENDENT_AMBULATORY_CARE_PROVIDER_SITE_OTHER): Payer: PRIVATE HEALTH INSURANCE | Admitting: Sports Medicine

## 2022-01-26 DIAGNOSIS — H532 Diplopia: Secondary | ICD-10-CM | POA: Diagnosis not present

## 2022-01-26 DIAGNOSIS — M2550 Pain in unspecified joint: Secondary | ICD-10-CM | POA: Diagnosis not present

## 2022-01-26 DIAGNOSIS — G379 Demyelinating disease of central nervous system, unspecified: Secondary | ICD-10-CM | POA: Diagnosis not present

## 2022-01-26 DIAGNOSIS — M5030 Other cervical disc degeneration, unspecified cervical region: Secondary | ICD-10-CM

## 2022-01-26 MED ORDER — KETOROLAC TROMETHAMINE 30 MG/ML IJ SOLN
30.0000 mg | Freq: Once | INTRAMUSCULAR | Status: AC
  Administered 2022-01-26: 30 mg via INTRAMUSCULAR
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
## 2022-01-26 NOTE — Assessment & Plan Note (Addendum)
Nina Trujillo is also complaining of chronic intermittent diplopia, she also describes blurry vision during these episodes, she is unable to describe it more clearly so the history is very vague. Her neurologic exam is unrevealing today and she does not describe any subsequent headaches headaches. She did see an optometrist who told her everything was normal, I am going to go ahead and do a brain MRI and get her back in with her neurologist, we will also get some labs.  Update: Brain MRI does show additional white matter disease, as well as more findings that are consistent with multiple sclerosis particularly considering the new onset diplopia, we really need to get her back into Nina Trujillo with neurology.

## 2022-01-26 NOTE — Addendum Note (Signed)
Addended by: Dema Severin on: 01/26/2022 04:55 PM   Modules accepted: Orders
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
## 2022-01-27 ENCOUNTER — Ambulatory Visit: Payer: Medicare (Managed Care) | Admitting: Physical Therapy

## 2022-01-27 ENCOUNTER — Encounter: Payer: Self-pay | Admitting: Physical Therapy

## 2022-01-27 DIAGNOSIS — R29898 Other symptoms and signs involving the musculoskeletal system: Secondary | ICD-10-CM

## 2022-01-27 DIAGNOSIS — R293 Abnormal posture: Secondary | ICD-10-CM

## 2022-01-27 DIAGNOSIS — M5412 Radiculopathy, cervical region: Secondary | ICD-10-CM | POA: Diagnosis not present

## 2022-01-27 DIAGNOSIS — M542 Cervicalgia: Secondary | ICD-10-CM

## 2022-01-27 DIAGNOSIS — R42 Dizziness and giddiness: Secondary | ICD-10-CM

## 2022-01-27 DIAGNOSIS — M25511 Pain in right shoulder: Secondary | ICD-10-CM

## 2022-01-27 DIAGNOSIS — M6281 Muscle weakness (generalized): Secondary | ICD-10-CM
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
## 2022-02-02 ENCOUNTER — Ambulatory Visit
Admission: RE | Admit: 2022-02-02 | Discharge: 2022-02-02 | Disposition: A | Discharge status: Home / Self Care | Payer: Medicare (Managed Care) | Source: Ambulatory Visit | Attending: Sports Medicine | Admitting: Sports Medicine

## 2022-02-02 DIAGNOSIS — M5030 Other cervical disc degeneration, unspecified cervical region: Secondary | ICD-10-CM

## 2022-02-02 LAB — COMPREHENSIVE METABOLIC PANEL
AG Ratio: 1.8 (calc) (ref 1.0–2.5)
ALT: 12 U/L (ref 6–29)
AST: 13 U/L (ref 10–35)
Albumin: 4.3 g/dL (ref 3.6–5.1)
Alkaline phosphatase (APISO): 88 U/L (ref 37–153)
BUN/Creatinine Ratio: 20 (calc) (ref 6–22)
BUN: 24 mg/dL (ref 7–25)
CO2: 31 mmol/L (ref 20–32)
Calcium: 10.4 mg/dL (ref 8.6–10.4)
Chloride: 107 mmol/L (ref 98–110)
Creat: 1.19 mg/dL — ABNORMAL HIGH (ref 0.50–1.03)
Globulin: 2.4 g/dL (calc) (ref 1.9–3.7)
Glucose, Bld: 90 mg/dL (ref 65–99)
Potassium: 4.6 mmol/L (ref 3.5–5.3)
Sodium: 143 mmol/L (ref 135–146)
Total Bilirubin: 0.2 mg/dL (ref 0.2–1.2)
Total Protein: 6.7 g/dL (ref 6.1–8.1)

## 2022-02-02 LAB — CBC WITH DIFFERENTIAL/PLATELET
Absolute Monocytes: 558 cells/uL (ref 200–950)
Basophils Absolute: 86 cells/uL (ref 0–200)
Basophils Relative: 0.6 %
Eosinophils Absolute: 215 cells/uL (ref 15–500)
Eosinophils Relative: 1.5 %
HCT: 43.3 % (ref 35.0–45.0)
Hemoglobin: 14.4 g/dL (ref 11.7–15.5)
Lymphs Abs: 5792 cells/uL — ABNORMAL HIGH (ref 850–3900)
MCH: 30.8 pg (ref 27.0–33.0)
MCHC: 33.3 g/dL (ref 32.0–36.0)
MCV: 92.7 fL (ref 80.0–100.0)
MPV: 11.6 fL (ref 7.5–12.5)
Monocytes Relative: 3.9 %
Neutro Abs: 7651 cells/uL (ref 1500–7800)
Neutrophils Relative %: 53.5 %
Platelets: 287 10*3/uL (ref 140–400)
RBC: 4.67 10*6/uL (ref 3.80–5.10)
RDW: 14.1 % (ref 11.0–15.0)
Total Lymphocyte: 40.5 %
WBC: 14.3 10*3/uL — ABNORMAL HIGH (ref 3.8–10.8)

## 2022-02-02 LAB — TIER 1
Chromatin (Nucleosomal) Antibody: <1 AI
ENA SM Ab Ser-aCnc: <1 AI
Ribonucleic Protein(ENA) Antibody, IgG: <1 AI
SM/RNP: <1 AI
ds DNA Ab: <1 IU/mL

## 2022-02-02 LAB — TIER 2
Jo-1 Autoabs: <1 AI
SSA (Ro) (ENA) Antibody, IgG: <1 AI
SSB (La) (ENA) Antibody, IgG: <1 AI
Scleroderma (Scl-70) (ENA) Antibody, IgG: <1 AI

## 2022-02-02 LAB — ANA SCREEN,IFA,REFLEX TITER/PATTERN,REFLEX MPLX 11 AB CASCADE
14-3-3 eta Protein: <0.2 ng/mL (ref <0.2)
Anti Nuclear Antibody (ANA): POSITIVE — AB
Cyclic Citrullin Peptide Ab: <16 UNITS
Rheumatoid fact SerPl-aCnc: <14 IU/mL (ref <14)

## 2022-02-02 LAB — SEDIMENTATION RATE: Sed Rate: 2 mm/h (ref 0–30)

## 2022-02-02 LAB — TIER 3
Centromere Ab Screen: <1 AI
Ribosomal P Protein Ab: <1 AI

## 2022-02-02 LAB — ANTI-NUCLEAR AB-TITER (ANA TITER): ANA Titer 1: 1:40 {titer} — ABNORMAL HIGH

## 2022-02-02 LAB — INTERPRETATION

## 2022-02-02 MED ORDER — IOPAMIDOL (ISOVUE-M 300) INJECTION 61%
1.0000 mL | Freq: Once | INTRAMUSCULAR | Status: AC
  Administered 2022-02-02: 1 mL via EPIDURAL

## 2022-02-02 MED ORDER — TRIAMCINOLONE ACETONIDE 40 MG/ML IJ SUSP (RADIOLOGY)
60.0000 mg | Freq: Once | INTRAMUSCULAR | Status: AC
  Administered 2022-02-02: 60 mg via EPIDURAL

## 2022-02-02 NOTE — Discharge Instructions (Signed)

## 2022-02-03 ENCOUNTER — Ambulatory Visit: Payer: Medicare (Managed Care) | Admitting: Physical Therapy

## 2022-02-08 ENCOUNTER — Ambulatory Visit (INDEPENDENT_AMBULATORY_CARE_PROVIDER_SITE_OTHER): Payer: PRIVATE HEALTH INSURANCE

## 2022-02-08 ENCOUNTER — Telehealth: Payer: Self-pay

## 2022-02-08 DIAGNOSIS — R519 Headache, unspecified: Secondary | ICD-10-CM | POA: Diagnosis not present

## 2022-02-08 DIAGNOSIS — R42 Dizziness and giddiness: Secondary | ICD-10-CM

## 2022-02-08 DIAGNOSIS — H532 Diplopia: Secondary | ICD-10-CM | POA: Diagnosis not present

## 2022-02-08 MED ORDER — GADOPICLENOL 0.5 MMOL/ML IV SOLN
9.0000 mL | Freq: Once | INTRAVENOUS | Status: AC | PRN
  Administered 2022-02-08: 9 mL via INTRAVENOUS

## 2022-02-08 NOTE — Telephone Encounter (Signed)
Initiated Prior authorization [id number] Sensor Via: Covermymeds Case/Key:[id number] Status: approved as of 02/08/22 Reason:02/03/22-02/10/2023 Notified Pt via: pt does not have Mychart

## 2022-02-10 ENCOUNTER — Ambulatory Visit: Payer: Medicare (Managed Care) | Admitting: Physical Therapy

## 2022-02-10 ENCOUNTER — Encounter: Payer: Self-pay | Admitting: Physical Therapy

## 2022-02-10 DIAGNOSIS — M542 Cervicalgia: Secondary | ICD-10-CM

## 2022-02-10 DIAGNOSIS — R293 Abnormal posture: Secondary | ICD-10-CM

## 2022-02-10 DIAGNOSIS — M6281 Muscle weakness (generalized): Secondary | ICD-10-CM

## 2022-02-10 DIAGNOSIS — M5412 Radiculopathy, cervical region: Secondary | ICD-10-CM | POA: Diagnosis not present
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
## 2022-02-10 NOTE — Assessment & Plan Note (Signed)
Nina Trujillo has a history of nonspecific T2 hyperintensities consistent with a possible central demyelinating process, she has been seen with neurologist and oligoclonal band testing was negative.  We did obtain an updated brain MRI with new symptoms of diplopia. Updated brain MRI does show additional white matter disease, as well as more findings that are consistent with multiple sclerosis particularly considering the new onset diplopia, we really need to get her back into Dr. Felecia Shelling with neurology.

## 2022-02-15 ENCOUNTER — Other Ambulatory Visit: Payer: Medicare (Managed Care)

## 2022-02-17 ENCOUNTER — Ambulatory Visit: Payer: Medicare (Managed Care) | Attending: Sports Medicine | Admitting: Physical Therapy

## 2022-02-17 ENCOUNTER — Encounter: Payer: Self-pay | Admitting: Physical Therapy

## 2022-02-17 DIAGNOSIS — M542 Cervicalgia: Secondary | ICD-10-CM | POA: Insufficient documentation

## 2022-02-17 DIAGNOSIS — R29898 Other symptoms and signs involving the musculoskeletal system: Secondary | ICD-10-CM | POA: Diagnosis present

## 2022-02-17 DIAGNOSIS — R293 Abnormal posture: Secondary | ICD-10-CM | POA: Insufficient documentation

## 2022-02-17 DIAGNOSIS — M6281 Muscle weakness (generalized): Secondary | ICD-10-CM | POA: Insufficient documentation
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
## 2022-02-22 ENCOUNTER — Ambulatory Visit (INDEPENDENT_AMBULATORY_CARE_PROVIDER_SITE_OTHER): Payer: Medicare (Managed Care)

## 2022-02-22 DIAGNOSIS — R93429 Abnormal radiologic findings on diagnostic imaging of unspecified kidney: Secondary | ICD-10-CM | POA: Diagnosis not present

## 2022-02-22 DIAGNOSIS — N3289 Other specified disorders of bladder: Secondary | ICD-10-CM | POA: Diagnosis not present

## 2022-02-22 DIAGNOSIS — R1909 Other intra-abdominal and pelvic swelling, mass and lump: Secondary | ICD-10-CM | POA: Diagnosis not present

## 2022-02-22 MED ORDER — GADOPICLENOL 0.5 MMOL/ML IV SOLN
8.9000 mL | Freq: Once | INTRAVENOUS | Status: AC | PRN
  Administered 2022-02-22: 8.9 mL via INTRAVENOUS

## 2022-02-23 NOTE — Progress Notes (Signed)
GREAT news. The area of concern appears to be cystic and benign. Recommended follow up with another MRI in 3 months.

## 2022-02-24 ENCOUNTER — Ambulatory Visit: Payer: Medicare (Managed Care) | Admitting: Physical Therapy

## 2022-02-24 ENCOUNTER — Encounter: Payer: Self-pay | Admitting: Physical Therapy

## 2022-02-24 DIAGNOSIS — M6281 Muscle weakness (generalized): Secondary | ICD-10-CM

## 2022-02-24 DIAGNOSIS — M542 Cervicalgia: Secondary | ICD-10-CM | POA: Diagnosis not present

## 2022-02-24 DIAGNOSIS — R293 Abnormal posture: Secondary | ICD-10-CM
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
## 2022-03-01 ENCOUNTER — Encounter: Payer: Self-pay | Admitting: Physician Assistant

## 2022-03-01 ENCOUNTER — Other Ambulatory Visit: Payer: Self-pay | Admitting: Sports Medicine

## 2022-03-01 ENCOUNTER — Telehealth (INDEPENDENT_AMBULATORY_CARE_PROVIDER_SITE_OTHER): Payer: Medicare (Managed Care) | Admitting: Medical-Surgical

## 2022-03-01 ENCOUNTER — Encounter: Payer: Self-pay | Admitting: Medical-Surgical

## 2022-03-01 DIAGNOSIS — R21 Rash and other nonspecific skin eruption: Secondary | ICD-10-CM

## 2022-03-01 DIAGNOSIS — M5030 Other cervical disc degeneration, unspecified cervical region: Secondary | ICD-10-CM

## 2022-03-01 MED ORDER — METHYLPREDNISOLONE 4 MG PO TBPK: ORAL_TABLET | ORAL | 0 refills | Status: DC

## 2022-03-01 NOTE — Progress Notes (Signed)
Virtual Visit via Telephone   I connected with  Nina Trujillo  on 03/01/22 by telephone/telehealth and verified that I am speaking with the correct person using two identifiers.   I discussed the limitations, risks, security and privacy concerns of performing an evaluation and management service by telephone, including the higher likelihood of inaccurate diagnosis and treatment, and the availability of in person appointments.  We also discussed the likely need of an additional face to face encounter for complete and high quality delivery of care.  I also discussed with the patient that there may be a patient responsible charge related to this service. The patient expressed understanding and wishes to proceed.  Provider location is in medical facility. Patient location is at their home, different from provider location. People involved in care of the patient during this telehealth encounter were myself, my nurse/medical assistant, and my front office/scheduling team member.  CC: Rash  HPI: Pleasant 56 year old female presenting today via telephone visit after being unsuccessful at connecting with video capabilities.  Reports that over the weekend, she ate out at a restaurant where she had teriyaki chicken, salad, carrots, celery, and dressing that was mixed ranch and blue cheese.  On Sunday morning, she and her grandson had smoothies with strawberries, bananas, pineapple, and almond milk.  On Saturday, she noticed that her face felt funny and then yesterday she awoke with red splotchy bumps over her face and chest that are very itchy.  Also notes that her lips feel swollen and dry.  Has taken Benadryl 25 mg once which was somewhat helpful.  Also has cortisone cream which she rubbed on her face.  Reports that she has had a lot of increased stress lately with her daughter being hospitalized.  She has not been feeling great overall and has been pushing through but wonders if this rash may be related  to stress or an unknown allergy.  Denies tongue swelling, difficulty swallowing, and respiratory distress.  Review of Systems: See HPI for pertinent positives and negatives.   Objective Findings:    General: Speaking full sentences, no audible heavy breathing.  Sounds alert and appropriately interactive.    Impression and Recommendations:    1. Rash Unable to visualize rash due to no video capabilities.  Recommend treating with Medrol Dosepak as well as Benadryl 25-50 mg every 6 hours as needed.  Reviewed other measures to help with itching including calamine lotion, and sparing use of corticosteroid cream.  Monitor for respiratory distress, tongue/throat swelling, and worsening of the rash.  If her rash improves and then returns, return for in person evaluation. - methylPREDNISolone (MEDROL DOSEPAK) 4 MG TBPK tablet; 6-day pack as directed  Dispense: 21 tablet; Refill: 0   I discussed the above assessment and treatment plan with the patient. The patient was provided an opportunity to ask questions and all were answered. The patient agreed with the plan and demonstrated an understanding of the instructions.   The patient was advised to call back or seek an in-person evaluation if the symptoms worsen or if the condition fails to improve as anticipated.  20 minutes of non-face-to-face time was provided during this encounter.  No follow-ups on file. ___________________________________________ Samuel Bouche, DNP, APRN, FNP-BC [facility] [place]

## 2022-03-01 NOTE — Telephone Encounter (Signed)
I called patient and scheduled her for a virtual visit.

## 2022-03-03 ENCOUNTER — Ambulatory Visit: Payer: Medicare (Managed Care) | Admitting: Physical Therapy

## 2022-03-03 ENCOUNTER — Encounter: Payer: Self-pay | Admitting: Physical Therapy

## 2022-03-03 DIAGNOSIS — M542 Cervicalgia: Secondary | ICD-10-CM

## 2022-03-03 DIAGNOSIS — M6281 Muscle weakness (generalized): Secondary | ICD-10-CM

## 2022-03-03 DIAGNOSIS — R293 Abnormal posture: Secondary | ICD-10-CM
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
## 2022-03-09 ENCOUNTER — Ambulatory Visit (INDEPENDENT_AMBULATORY_CARE_PROVIDER_SITE_OTHER): Payer: Medicare (Managed Care) | Admitting: Sports Medicine

## 2022-03-09 DIAGNOSIS — M5030 Other cervical disc degeneration, unspecified cervical region: Secondary | ICD-10-CM

## 2022-03-09 DIAGNOSIS — H532 Diplopia: Secondary | ICD-10-CM

## 2022-03-09 NOTE — Assessment & Plan Note (Signed)
Please see prior notes, she will be getting back in with her neurologist, she was having chronic intermittent diplopia, with a brain MRI that showed findings consistent with multiple sclerosis.

## 2022-03-09 NOTE — Progress Notes (Signed)
    Procedures performed today:    None.  Independent interpretation of notes and tests performed by another provider:   None.  Brief History, Exam, Impression, and Recommendations:    DDD (degenerative disc disease), cervical This pleasant 56 year old female returns, she has chronic axial neck pain with multilevel cervical DDD, she does have moderate to severe central canal stenosis worst from C4-C6. She had a recent cervical epidural, good relief, would like another epidural for additional relief. Continue gabapentin and tramadol, return to see me as needed.  Diplopia Please see prior notes, she will be getting back in with her neurologist, she was having chronic intermittent diplopia, with a brain MRI that showed findings consistent with multiple sclerosis.    ____________________________________________ Gwen Her. Dianah Field, M.D., ABFM., CAQSM., AME. Primary Care and Sports Medicine [facility]  Adjunct Professor of [employer]  Risk manager

## 2022-03-09 NOTE — Assessment & Plan Note (Signed)
This pleasant 56 year old female returns, she has chronic axial neck pain with multilevel cervical DDD, she does have moderate to severe central canal stenosis worst from C4-C6. She had a recent cervical epidural, good relief, would like another epidural for additional relief. Continue gabapentin and tramadol, return to see me as needed.

## 2022-03-17 ENCOUNTER — Encounter: Payer: Self-pay | Admitting: Neurology

## 2022-03-17 ENCOUNTER — Ambulatory Visit (INDEPENDENT_AMBULATORY_CARE_PROVIDER_SITE_OTHER): Payer: Medicare (Managed Care) | Admitting: Neurology

## 2022-03-17 VITALS — BP 142/82 | HR 108 | Ht 66.0 in | Wt 200.4 lb

## 2022-03-17 DIAGNOSIS — R9082 White matter disease, unspecified: Secondary | ICD-10-CM | POA: Diagnosis not present

## 2022-03-17 DIAGNOSIS — M4802 Spinal stenosis, cervical region: Secondary | ICD-10-CM | POA: Diagnosis not present

## 2022-03-17 DIAGNOSIS — H532 Diplopia: Secondary | ICD-10-CM | POA: Diagnosis not present

## 2022-03-17 DIAGNOSIS — M5030 Other cervical disc degeneration, unspecified cervical region: Secondary | ICD-10-CM

## 2022-03-17 MED ORDER — ETODOLAC 400 MG PO TABS: 400.0000 mg | ORAL_TABLET | Freq: Two times a day (BID) | ORAL | 5 refills | Status: DC

## 2022-03-17 MED ORDER — GABAPENTIN 300 MG PO CAPS: ORAL_CAPSULE | ORAL | 3 refills | Status: DC
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
## 2022-03-18 ENCOUNTER — Other Ambulatory Visit: Payer: Self-pay | Admitting: Neurology

## 2022-03-18 LAB — ACETYLCHOLINE RECEPTOR, BINDING: AChR Binding Ab, Serum: <0.03 nmol/L (ref 0.00–0.24)

## 2022-03-18 LAB — HOMOCYSTEINE: Homocysteine: 17.5 umol/L — ABNORMAL HIGH (ref 0.0–14.5)

## 2022-03-18 MED ORDER — FOLIC ACID-VIT B6-VIT B12 2.5-25-1 MG PO TABS: 1.0000 | ORAL_TABLET | Freq: Every day | ORAL | 3 refills | Status: DC

## 2022-03-22 ENCOUNTER — Encounter: Payer: Self-pay | Admitting: Neurology

## 2022-03-22 NOTE — Telephone Encounter (Signed)
Called pharmacy at [phone]. Spoke w/ pharmacy tech. States pt received old gabapentin '300mg'$  rx 03/17/22 for 1 capsule po BID  #180. She will need to take this three times a day and when she gets closer to needing a refill, they can dispense new rx sent for TID dosing by Dr. Felecia Shelling.

## 2022-03-23 ENCOUNTER — Ambulatory Visit
Admission: RE | Admit: 2022-03-23 | Discharge: 2022-03-23 | Disposition: A | Discharge status: Home / Self Care | Payer: PRIVATE HEALTH INSURANCE | Source: Ambulatory Visit | Attending: Sports Medicine | Admitting: Sports Medicine

## 2022-03-23 DIAGNOSIS — M5030 Other cervical disc degeneration, unspecified cervical region: Secondary | ICD-10-CM

## 2022-03-23 MED ORDER — IOPAMIDOL (ISOVUE-M 300) INJECTION 61%
1.0000 mL | Freq: Once | INTRAMUSCULAR | Status: AC
  Administered 2022-03-23: 1 mL via EPIDURAL

## 2022-03-23 MED ORDER — TRIAMCINOLONE ACETONIDE 40 MG/ML IJ SUSP (RADIOLOGY)
60.0000 mg | Freq: Once | INTRAMUSCULAR | Status: AC
  Administered 2022-03-23: 60 mg via EPIDURAL

## 2022-03-23 NOTE — Discharge Instructions (Signed)

## 2022-04-06 ENCOUNTER — Ambulatory Visit (INDEPENDENT_AMBULATORY_CARE_PROVIDER_SITE_OTHER): Payer: Medicare (Managed Care) | Admitting: Physician Assistant

## 2022-04-06 VITALS — BP 147/79 | HR 86 | Ht 66.0 in | Wt 203.0 lb

## 2022-04-06 DIAGNOSIS — E119 Type 2 diabetes mellitus without complications: Secondary | ICD-10-CM

## 2022-04-06 DIAGNOSIS — M5030 Other cervical disc degeneration, unspecified cervical region: Secondary | ICD-10-CM

## 2022-04-06 DIAGNOSIS — E1122 Type 2 diabetes mellitus with diabetic chronic kidney disease: Secondary | ICD-10-CM | POA: Diagnosis not present

## 2022-04-06 DIAGNOSIS — Z1231 Encounter for screening mammogram for malignant neoplasm of breast: Secondary | ICD-10-CM | POA: Diagnosis not present

## 2022-04-06 DIAGNOSIS — N1831 Chronic kidney disease, stage 3a: Secondary | ICD-10-CM

## 2022-04-06 DIAGNOSIS — Z23 Encounter for immunization: Secondary | ICD-10-CM | POA: Diagnosis not present

## 2022-04-06 DIAGNOSIS — Z6833 Body mass index (BMI) 33.0-33.9, adult: Secondary | ICD-10-CM

## 2022-04-06 DIAGNOSIS — M5412 Radiculopathy, cervical region: Secondary | ICD-10-CM

## 2022-04-06 DIAGNOSIS — E6609 Other obesity due to excess calories: Secondary | ICD-10-CM

## 2022-04-06 DIAGNOSIS — I10 Essential (primary) hypertension: Secondary | ICD-10-CM

## 2022-04-06 DIAGNOSIS — G379 Demyelinating disease of central nervous system, unspecified: Secondary | ICD-10-CM

## 2022-04-06 MED ORDER — OZEMPIC (2 MG/DOSE) 8 MG/3ML ~~LOC~~ SOPN: 2.0000 mg | PEN_INJECTOR | SUBCUTANEOUS | 1 refills | Status: DC

## 2022-04-06 MED ORDER — METFORMIN HCL 1000 MG PO TABS: 1000.0000 mg | ORAL_TABLET | Freq: Two times a day (BID) | ORAL | 1 refills | Status: DC

## 2022-04-06 MED ORDER — TOUJEO MAX SOLOSTAR 300 UNIT/ML ~~LOC~~ SOPN: 13.0000 [IU] | PEN_INJECTOR | Freq: Every day | SUBCUTANEOUS | 1 refills | Status: DC

## 2022-04-06 MED ORDER — PEN NEEDLES 31G X 6 MM MISC: 99 refills | Status: DC

## 2022-04-06 NOTE — Patient Instructions (Signed)
Will refer to neurology and back to neurosurgery.
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
## 2022-04-07 ENCOUNTER — Ambulatory Visit: Payer: Medicaid Other | Admitting: Neurology

## 2022-04-12 ENCOUNTER — Encounter: Payer: Self-pay | Admitting: Physician Assistant

## 2022-04-12 LAB — POCT GLYCOSYLATED HEMOGLOBIN (HGB A1C): Hemoglobin A1C: 6.6 % — AB (ref 4.0–5.6)

## 2022-04-12 MED ORDER — HYDROCHLOROTHIAZIDE 25 MG PO TABS: ORAL_TABLET | ORAL | 1 refills | Status: DC

## 2022-04-13 ENCOUNTER — Other Ambulatory Visit: Payer: Self-pay | Admitting: Sports Medicine

## 2022-04-13 MED ORDER — MELOXICAM 15 MG PO TABS: ORAL_TABLET | ORAL | 3 refills | Status: DC

## 2022-04-15 ENCOUNTER — Telehealth: Payer: Self-pay | Admitting: Neurology

## 2022-04-15 ENCOUNTER — Ambulatory Visit (INDEPENDENT_AMBULATORY_CARE_PROVIDER_SITE_OTHER): Payer: Medicare (Managed Care)

## 2022-04-15 DIAGNOSIS — Z1231 Encounter for screening mammogram for malignant neoplasm of breast: Secondary | ICD-10-CM | POA: Diagnosis not present

## 2022-04-15 NOTE — Telephone Encounter (Signed)
She has medicaid I sent the order to GI not sure if it needs auth, they will either get it or let me know if it does. [phone]

## 2022-04-21 NOTE — Progress Notes (Signed)
Please call patient. Normal mammogram.  Repeat in 1 year.  

## 2022-04-22 ENCOUNTER — Other Ambulatory Visit: Payer: Self-pay | Admitting: Physician Assistant

## 2022-04-22 DIAGNOSIS — E6609 Other obesity due to excess calories: Secondary | ICD-10-CM

## 2022-04-23 NOTE — Telephone Encounter (Signed)
Last appt 04/06/2022   Last written 10/05/2021 #90 no refills

## 2022-05-03 ENCOUNTER — Other Ambulatory Visit: Payer: Self-pay

## 2022-05-03 DIAGNOSIS — M5030 Other cervical disc degeneration, unspecified cervical region: Secondary | ICD-10-CM

## 2022-05-03 MED ORDER — GABAPENTIN 300 MG PO CAPS: ORAL_CAPSULE | ORAL | 3 refills | Status: DC

## 2022-05-13 ENCOUNTER — Other Ambulatory Visit: Payer: Self-pay | Admitting: Neurology

## 2022-05-13 DIAGNOSIS — E6609 Other obesity due to excess calories: Secondary | ICD-10-CM

## 2022-05-13 DIAGNOSIS — E782 Mixed hyperlipidemia: Secondary | ICD-10-CM

## 2022-05-13 DIAGNOSIS — G44209 Tension-type headache, unspecified, not intractable: Secondary | ICD-10-CM

## 2022-05-13 DIAGNOSIS — Z716 Tobacco abuse counseling: Secondary | ICD-10-CM

## 2022-05-13 DIAGNOSIS — N1831 Chronic kidney disease, stage 3a: Secondary | ICD-10-CM

## 2022-05-13 DIAGNOSIS — I10 Essential (primary) hypertension: Secondary | ICD-10-CM

## 2022-05-13 DIAGNOSIS — R0789 Other chest pain: Secondary | ICD-10-CM

## 2022-05-13 DIAGNOSIS — N951 Menopausal and female climacteric states: Secondary | ICD-10-CM

## 2022-05-13 DIAGNOSIS — R112 Nausea with vomiting, unspecified: Secondary | ICD-10-CM

## 2022-05-13 MED ORDER — DEXCOM G7 SENSOR MISC: 1.0000 | Freq: Every day | 5 refills | Status: DC

## 2022-05-13 MED ORDER — ONDANSETRON 8 MG PO TBDP: 8.0000 mg | ORAL_TABLET | Freq: Three times a day (TID) | ORAL | 1 refills | Status: DC | PRN

## 2022-05-13 MED ORDER — OMEPRAZOLE 40 MG PO CPDR: 40.0000 mg | DELAYED_RELEASE_CAPSULE | Freq: Every day | ORAL | 1 refills | Status: DC

## 2022-05-13 MED ORDER — ATORVASTATIN CALCIUM 20 MG PO TABS: 20.0000 mg | ORAL_TABLET | Freq: Every day | ORAL | 1 refills | Status: DC

## 2022-05-13 MED ORDER — ESTRADIOL 2 MG PO TABS: 2.0000 mg | ORAL_TABLET | Freq: Every day | ORAL | 1 refills | Status: DC

## 2022-05-13 MED ORDER — HYDROCHLOROTHIAZIDE 25 MG PO TABS: ORAL_TABLET | ORAL | 1 refills | Status: DC

## 2022-05-13 MED ORDER — BUPROPION HCL ER (SR) 200 MG PO TB12: 200.0000 mg | ORAL_TABLET | Freq: Two times a day (BID) | ORAL | 1 refills | Status: DC

## 2022-05-13 MED ORDER — BACLOFEN 10 MG PO TABS: 10.0000 mg | ORAL_TABLET | Freq: Every evening | ORAL | 0 refills | Status: DC | PRN

## 2022-05-13 MED ORDER — OZEMPIC (2 MG/DOSE) 8 MG/3ML ~~LOC~~ SOPN: 2.0000 mg | PEN_INJECTOR | SUBCUTANEOUS | 1 refills | Status: DC

## 2022-05-13 NOTE — Telephone Encounter (Signed)
Received note from [facility] that patient is changing RX to their pharmacy. Sent requested medications except Phentermine.   Phentermine last written 04/23/2022 for one month Patient last seen 04/06/2022 Please advise.

## 2022-05-18 ENCOUNTER — Encounter: Payer: Self-pay | Admitting: Physician Assistant

## 2022-05-18 NOTE — Telephone Encounter (Signed)
Patient left VM on  my line to schedule a visit. Please call her.

## 2022-05-21 ENCOUNTER — Encounter: Payer: Self-pay | Admitting: Sports Medicine

## 2022-05-21 ENCOUNTER — Ambulatory Visit (INDEPENDENT_AMBULATORY_CARE_PROVIDER_SITE_OTHER): Payer: Medicare HMO

## 2022-05-21 ENCOUNTER — Ambulatory Visit (INDEPENDENT_AMBULATORY_CARE_PROVIDER_SITE_OTHER): Payer: Medicare HMO | Admitting: Sports Medicine

## 2022-05-21 VITALS — BP 146/83 | HR 103 | Temp 98.2°F | Wt 205.0 lb

## 2022-05-21 DIAGNOSIS — B349 Viral infection, unspecified: Secondary | ICD-10-CM | POA: Diagnosis not present

## 2022-05-21 DIAGNOSIS — J40 Bronchitis, not specified as acute or chronic: Secondary | ICD-10-CM | POA: Diagnosis not present

## 2022-05-21 DIAGNOSIS — R059 Cough, unspecified: Secondary | ICD-10-CM

## 2022-05-21 MED ORDER — HYDROCOD POLI-CHLORPHE POLI ER 10-8 MG/5ML PO SUER: 5.0000 mL | Freq: Two times a day (BID) | ORAL | 0 refills | Status: DC | PRN

## 2022-05-21 NOTE — Assessment & Plan Note (Signed)
Nina Trujillo is a pleasant 57 year old female, she has multiple medical comorbidities, for approximately a little over a week now she has had headaches, low-grade fevers, minimally productive cough, fatigue, myalgias. She did not present until today, fevers have resolved but she still has a headache, fatigue, and cough. On exam she is not in any respiratory distress, oropharyngeal exam is negative, neck exam negative, she does have some trace Rales/rhonchi left lower lobe. Her COVID and flu tests today are negative. Since her main complaint is cough we will add Tussionex at night, considering abnormal chest exam we will get a chest x-ray, if there is a pneumonia we will use azithromycin.  CXR does show bronchitis/atypical pneumonitis, adding azithromycin.

## 2022-05-21 NOTE — Progress Notes (Addendum)
    Procedures performed today:    None.  Independent interpretation of notes and tests performed by another provider:   None.  Brief History, Exam, Impression, and Recommendations:    Viral syndrome Nina Trujillo is a pleasant 57 year old female, she has multiple medical comorbidities, for approximately a little over a week now she has had headaches, low-grade fevers, minimally productive cough, fatigue, myalgias. She did not present until today, fevers have resolved but she still has a headache, fatigue, and cough. On exam she is not in any respiratory distress, oropharyngeal exam is negative, neck exam negative, she does have some trace Rales/rhonchi left lower lobe. Her COVID and flu tests today are negative. Since her main complaint is cough we will add Tussionex at night, considering abnormal chest exam we will get a chest x-ray, if there is a pneumonia we will use azithromycin.  I spent 30 minutes of total time managing this patient today, this includes chart review, face to face, and non-face to face time.  ____________________________________________ Gwen Her. Dianah Field, M.D., ABFM., CAQSM., AME. Primary Care and Sports Medicine [facility]  Adjunct Professor of [place] [employer] [facility] [employer]  Risk manager

## 2022-05-23 MED ORDER — AZITHROMYCIN 250 MG PO TABS: ORAL_TABLET | ORAL | 0 refills | Status: DC

## 2022-05-23 NOTE — Addendum Note (Signed)
Addended by: Silverio Decamp on: 05/23/2022 10:20 PM   Modules accepted: Orders

## 2022-05-25 ENCOUNTER — Other Ambulatory Visit: Payer: Self-pay | Admitting: Physician Assistant

## 2022-05-25 DIAGNOSIS — F331 Major depressive disorder, recurrent, moderate: Secondary | ICD-10-CM

## 2022-06-04 ENCOUNTER — Encounter: Payer: Self-pay | Admitting: Sports Medicine

## 2022-06-04 ENCOUNTER — Ambulatory Visit (INDEPENDENT_AMBULATORY_CARE_PROVIDER_SITE_OTHER): Payer: Medicare HMO | Admitting: Sports Medicine

## 2022-06-04 VITALS — BP 140/84 | HR 98

## 2022-06-04 DIAGNOSIS — J40 Bronchitis, not specified as acute or chronic: Secondary | ICD-10-CM | POA: Diagnosis not present

## 2022-06-04 MED ORDER — HYDROCOD POLI-CHLORPHE POLI ER 10-8 MG/5ML PO SUER: 5.0000 mL | Freq: Two times a day (BID) | ORAL | 0 refills | Status: DC | PRN

## 2022-06-04 MED ORDER — DOXYCYCLINE HYCLATE 100 MG PO TABS: 100.0000 mg | ORAL_TABLET | Freq: Two times a day (BID) | ORAL | 0 refills | Status: AC

## 2022-06-04 MED ORDER — PREDNISONE 10 MG (48) PO TBPK: ORAL_TABLET | Freq: Every day | ORAL | 0 refills | Status: DC

## 2022-06-04 NOTE — Progress Notes (Signed)
    Procedures performed today:    None.  Independent interpretation of notes and tests performed by another provider:   None.  Brief History, Exam, Impression, and Recommendations:    Bronchitis This is a very pleasant 57 year old female, I saw her about a week and a half ago to 2 weeks ago with headaches, low-grade fevers intermittently productive cough, myalgias. She had some abnormal lung sounds so we got a chest x-ray that showed atypical pneumonitis versus bronchitis. COVID and flu tests were negative at the last visit. We added azithromycin and Tussionex. Still has significant rhinorrhea, adding a 12-day taper of prednisone, doxycycline as she is having significant facial pain and pressure as well as a refill on Tussionex.    ____________________________________________ Gwen Her. Dianah Field, M.D., ABFM., CAQSM., AME. Primary Care and Sports Medicine  [facility]  Adjunct Professor of [place] [employer] [facility] of Medicine  Risk manager

## 2022-06-04 NOTE — Assessment & Plan Note (Signed)
This is a very pleasant 57 year old female, I saw her about a week and a half ago to 2 weeks ago with headaches, low-grade fevers intermittently productive cough, myalgias. She had some abnormal lung sounds so we got a chest x-ray that showed atypical pneumonitis versus bronchitis. COVID and flu tests were negative at the last visit. We added azithromycin and Tussionex. Still has significant rhinorrhea, adding a 12-day taper of prednisone, doxycycline as she is having significant facial pain and pressure as well as a refill on Tussionex.

## 2022-06-11 DIAGNOSIS — E669 Obesity, unspecified: Secondary | ICD-10-CM | POA: Diagnosis not present

## 2022-06-11 DIAGNOSIS — E1122 Type 2 diabetes mellitus with diabetic chronic kidney disease: Secondary | ICD-10-CM | POA: Diagnosis not present

## 2022-06-11 DIAGNOSIS — K219 Gastro-esophageal reflux disease without esophagitis: Secondary | ICD-10-CM | POA: Diagnosis not present

## 2022-06-11 DIAGNOSIS — F419 Anxiety disorder, unspecified: Secondary | ICD-10-CM | POA: Diagnosis not present

## 2022-06-11 DIAGNOSIS — I129 Hypertensive chronic kidney disease with stage 1 through stage 4 chronic kidney disease, or unspecified chronic kidney disease: Secondary | ICD-10-CM | POA: Diagnosis not present

## 2022-06-11 DIAGNOSIS — N1831 Chronic kidney disease, stage 3a: Secondary | ICD-10-CM | POA: Diagnosis not present

## 2022-06-11 DIAGNOSIS — E785 Hyperlipidemia, unspecified: Secondary | ICD-10-CM | POA: Diagnosis not present

## 2022-06-11 DIAGNOSIS — R200 Anesthesia of skin: Secondary | ICD-10-CM | POA: Diagnosis not present

## 2022-06-11 DIAGNOSIS — Z0000 Encounter for general adult medical examination without abnormal findings: Secondary | ICD-10-CM | POA: Diagnosis not present

## 2022-06-18 DIAGNOSIS — N179 Acute kidney failure, unspecified: Secondary | ICD-10-CM | POA: Diagnosis not present

## 2022-06-21 ENCOUNTER — Encounter: Payer: Self-pay | Admitting: Physician Assistant

## 2022-06-21 ENCOUNTER — Telehealth (INDEPENDENT_AMBULATORY_CARE_PROVIDER_SITE_OTHER): Payer: Medicare HMO | Admitting: Physician Assistant

## 2022-06-21 DIAGNOSIS — E1122 Type 2 diabetes mellitus with diabetic chronic kidney disease: Secondary | ICD-10-CM

## 2022-06-21 DIAGNOSIS — N1831 Chronic kidney disease, stage 3a: Secondary | ICD-10-CM

## 2022-06-21 DIAGNOSIS — R351 Nocturia: Secondary | ICD-10-CM | POA: Diagnosis not present

## 2022-06-21 DIAGNOSIS — N179 Acute kidney failure, unspecified: Secondary | ICD-10-CM | POA: Insufficient documentation

## 2022-06-21 NOTE — Progress Notes (Signed)
Peeing a lot at night.  Fingers feel full  No appt with nephrology  Urine testing.   [name].Virtual Visit via Telephone Note  I connected with Nina Trujillo on 06/21/22 at 10:50 AM EST by telephone and verified that I am speaking with the correct person using two identifiers.  Location: Patient: home Provider: clinic  .Marland KitchenParticipating in visit:  Patient: Nina Trujillo Provider: Iran Planas PA-C   I discussed the limitations, risks, security and privacy concerns of performing an evaluation and management service by telephone and the availability of in person appointments. I also discussed with the patient that there may be a patient responsible charge related to this service. The patient expressed understanding and agreed to proceed.   History of Present Illness: Pt is a 57 yo female that had GFR suddenly decrease but was rechecked on Friday and did come back up to 57. Pt is having more urination at bedtime and night but no other symptoms. Urine is clear. She is having some low aback pain. No fever, chills, body aches, nausea or vomiting.     Observations/Objective: No acute distress Normal breathing   Assessment and Plan: Marland KitchenMarland KitchenDiagnoses and all orders for this visit:  AKI (acute kidney injury) ([place]) -     BASIC METABOLIC PANEL WITH GFR -     Urinalysis, Routine w reflex microscopic -     CBC w/Diff/Platelet -     Urine Culture  Type 2 diabetes mellitus with stage 3a chronic kidney disease, without long-term current use of insulin (HCC) -     BASIC METABOLIC PANEL WITH GFR -     Urinalysis, Routine w reflex microscopic -     CBC w/Diff/Platelet -     Urine Culture  Nocturia -     BASIC METABOLIC PANEL WITH GFR -     Urinalysis, Routine w reflex microscopic -     CBC w/Diff/Platelet -     Urine Culture    Reassured patient that GFR improved a lot Will recheck tomorrow UA ordered with reflex and culture Cbc/bmp recheck She has referral for nephrology but has not been  called yet, discussed to reach out Stay hydrated Avoid NSAIDs and alcohol   Follow Up Instructions:    I discussed the assessment and treatment plan with the patient. The patient was provided an opportunity to ask questions and all were answered. The patient agreed with the plan and demonstrated an understanding of the instructions.   The patient was advised to call back or seek an in-person evaluation if the symptoms worsen or if the condition fails to improve as anticipated.  I provided 10 minutes of non-face-to-face time during this encounter.   Iran Planas, PA-C

## 2022-06-22 DIAGNOSIS — E1122 Type 2 diabetes mellitus with diabetic chronic kidney disease: Secondary | ICD-10-CM | POA: Diagnosis not present

## 2022-06-22 DIAGNOSIS — R351 Nocturia: Secondary | ICD-10-CM | POA: Diagnosis not present

## 2022-06-22 DIAGNOSIS — M4722 Other spondylosis with radiculopathy, cervical region: Secondary | ICD-10-CM | POA: Diagnosis not present

## 2022-06-22 DIAGNOSIS — Z6832 Body mass index (BMI) 32.0-32.9, adult: Secondary | ICD-10-CM | POA: Diagnosis not present

## 2022-06-22 DIAGNOSIS — N1831 Chronic kidney disease, stage 3a: Secondary | ICD-10-CM | POA: Diagnosis not present

## 2022-06-22 DIAGNOSIS — G35 Multiple sclerosis: Secondary | ICD-10-CM | POA: Diagnosis not present

## 2022-06-22 DIAGNOSIS — N179 Acute kidney failure, unspecified: Secondary | ICD-10-CM | POA: Diagnosis not present

## 2022-06-23 ENCOUNTER — Other Ambulatory Visit: Payer: Self-pay | Admitting: Physician Assistant

## 2022-06-23 LAB — URINALYSIS, ROUTINE W REFLEX MICROSCOPIC
Bilirubin Urine: NEGATIVE
Hgb urine dipstick: NEGATIVE
Hyaline Cast: NONE SEEN /LPF
Ketones, ur: NEGATIVE
Leukocytes,Ua: NEGATIVE
Nitrite: NEGATIVE
Specific Gravity, Urine: 1.025 (ref 1.001–1.035)
pH: <=5 (ref 5.0–8.0)

## 2022-06-23 LAB — CBC WITH DIFFERENTIAL/PLATELET
Absolute Monocytes: 616 cells/uL (ref 200–950)
Basophils Absolute: 67 cells/uL (ref 0–200)
Basophils Relative: 0.6 %
Eosinophils Absolute: 224 cells/uL (ref 15–500)
Eosinophils Relative: 2 %
HCT: 37.8 % (ref 35.0–45.0)
Hemoglobin: 12.7 g/dL (ref 11.7–15.5)
Lymphs Abs: 3808 cells/uL (ref 850–3900)
MCH: 31.1 pg (ref 27.0–33.0)
MCHC: 33.6 g/dL (ref 32.0–36.0)
MCV: 92.6 fL (ref 80.0–100.0)
MPV: 12.6 fL — ABNORMAL HIGH (ref 7.5–12.5)
Monocytes Relative: 5.5 %
Neutro Abs: 6485 cells/uL (ref 1500–7800)
Neutrophils Relative %: 57.9 %
Platelets: 199 10*3/uL (ref 140–400)
RBC: 4.08 10*6/uL (ref 3.80–5.10)
RDW: 14.6 % (ref 11.0–15.0)
Total Lymphocyte: 34 %
WBC: 11.2 10*3/uL — ABNORMAL HIGH (ref 3.8–10.8)

## 2022-06-23 LAB — BASIC METABOLIC PANEL WITH GFR
BUN/Creatinine Ratio: 8 (calc) (ref 6–22)
BUN: 9 mg/dL (ref 7–25)
CO2: 32 mmol/L (ref 20–32)
Calcium: 9.6 mg/dL (ref 8.6–10.4)
Chloride: 108 mmol/L (ref 98–110)
Creat: 1.15 mg/dL — ABNORMAL HIGH (ref 0.50–1.03)
Glucose, Bld: 108 mg/dL — ABNORMAL HIGH (ref 65–99)
Potassium: 4.5 mmol/L (ref 3.5–5.3)
Sodium: 144 mmol/L (ref 135–146)
eGFR: 55 mL/min/{1.73_m2} — ABNORMAL LOW (ref >=60)

## 2022-06-23 LAB — URINE CULTURE
MICRO NUMBER:: 14525985
Result:: NO GROWTH
SPECIMEN QUALITY:: ADEQUATE

## 2022-06-23 LAB — MICROSCOPIC MESSAGE

## 2022-06-23 MED ORDER — NITROFURANTOIN MONOHYD MACRO 100 MG PO CAPS: 100.0000 mg | ORAL_CAPSULE | Freq: Two times a day (BID) | ORAL | 0 refills | Status: DC

## 2022-06-23 NOTE — Progress Notes (Signed)
Naryah,   It does look like you have bacteria in urine and represents an infection. Will send antibiotic to pharmacy.  Kidney function has stayed stable with GFR of 55 and even better than 8 months ago. Sent macrobid for 7 days to [facility].

## 2022-06-25 ENCOUNTER — Other Ambulatory Visit: Payer: Self-pay | Admitting: Neurosurgery

## 2022-06-25 DIAGNOSIS — M4722 Other spondylosis with radiculopathy, cervical region: Secondary | ICD-10-CM

## 2022-06-30 ENCOUNTER — Other Ambulatory Visit: Payer: Self-pay | Admitting: Physician Assistant

## 2022-06-30 ENCOUNTER — Encounter: Payer: Self-pay | Admitting: Pharmacist

## 2022-06-30 DIAGNOSIS — R112 Nausea with vomiting, unspecified: Secondary | ICD-10-CM

## 2022-07-03 ENCOUNTER — Ambulatory Visit (INDEPENDENT_AMBULATORY_CARE_PROVIDER_SITE_OTHER): Payer: Medicare HMO

## 2022-07-03 DIAGNOSIS — M25519 Pain in unspecified shoulder: Secondary | ICD-10-CM

## 2022-07-03 DIAGNOSIS — M50221 Other cervical disc displacement at C4-C5 level: Secondary | ICD-10-CM | POA: Diagnosis not present

## 2022-07-03 DIAGNOSIS — M4802 Spinal stenosis, cervical region: Secondary | ICD-10-CM | POA: Diagnosis not present

## 2022-07-03 DIAGNOSIS — M542 Cervicalgia: Secondary | ICD-10-CM

## 2022-07-03 DIAGNOSIS — M4722 Other spondylosis with radiculopathy, cervical region: Secondary | ICD-10-CM

## 2022-07-03 DIAGNOSIS — M50222 Other cervical disc displacement at C5-C6 level: Secondary | ICD-10-CM | POA: Diagnosis not present

## 2022-07-05 ENCOUNTER — Other Ambulatory Visit: Payer: Self-pay | Admitting: Pharmacist

## 2022-07-05 NOTE — Progress Notes (Signed)
Patient appearing on report for [employer] - Hypertension Control report due to last documented ambulatory blood pressure of 140/84 on 06/04/22. Next appointment with PCP is 07/07/22   Outreached patient to discuss hypertension control and medication management.   Current antihypertensives: hydrochlorothiazide 58m daily  Patient does not have an automated upper arm home BP machine.  Current blood pressure readings: not currently checking  Patient denies hypotensive signs and symptoms including dizziness, lightheadedness.  Patient reports hypertensive symptoms including some altered vision, she attributes to possibly sugar.     Assessment/Plan: - Currently unknown control - - Reviewed goal blood pressure <130/80 - Reviewed appropriate home BP monitoring technique (avoid caffeine, smoking, and exercise for 30 minutes before checking, rest for at least 5 minutes before taking BP, sit with feet flat on the floor and back against a hard surface, uncross legs, and rest arm on flat surface) - Reviewed to check blood pressure 2-3x per week, document, and provide at next provider visit - Recommend continue current regimen, patient to contact her insurance because she has already ordered upper arm BP cuff through OTC benefits. - Collaborated with primary care provider office staff to ensure patient has scheduled follow up - Patient verbalized her Dexcom G7 has not arrived yet, and she also ran out of toujeo yesterday. She has a refill at the pharmacy, encouraged patient to fill RX and contact our office with any questions or concerns.   KLarinda Buttery PharmD Clinical Pharmacist [phone]

## 2022-07-05 NOTE — Patient Instructions (Signed)
Nina Trujillo,  Thank you for speaking with me today! As we discussed, contact your insurance benefits for seeing where the blood pressure cuff is that you ordered.   Below are some helpful tips about checking blood pressure:  Check your blood pressure twice weekly, and any time you have concerning symptoms like headache, chest pain, dizziness, shortness of breath, or vision changes.   Our goal is less than 130/80.  To appropriately check your blood pressure, make sure you do the following:  1) Avoid caffeine, exercise, or tobacco products for 30 minutes before checking. Empty your bladder. 2) Sit with your back supported in a flat-backed chair. Rest your arm on something flat (arm of the chair, table, etc). 3) Sit still with your feet flat on the floor, resting, for at least 5 minutes.  4) Check your blood pressure. Take 1-2 readings.  5) Write down these readings and bring with you to any provider appointments.  Bring your home blood pressure machine with you to a provider's office for accuracy comparison at least once a year.   Make sure you take your blood pressure medications before you come to any office visit, even if you were asked to fast for labs.   Take care, Nina Trujillo, PharmD Clinical Pharmacist [facility] At [facility] [phone]

## 2022-07-05 NOTE — Progress Notes (Signed)
Patient appearing on report for [employer] [facility] - Hypertension Control report due to last documented ambulatory blood pressure of 140/84 on 06/04/22. Next appointment with PCP is 07/07/22   Outreached patient to discuss hypertension control and medication management. Left voicemail for patient to return my call at their convenience.   Larinda Buttery, PharmD Clinical Pharmacist [facility] Primary Care At [facility] [phone]

## 2022-07-06 ENCOUNTER — Other Ambulatory Visit: Payer: Self-pay

## 2022-07-06 ENCOUNTER — Encounter: Payer: Self-pay | Admitting: Physician Assistant

## 2022-07-06 ENCOUNTER — Other Ambulatory Visit: Payer: Self-pay | Admitting: Physician Assistant

## 2022-07-06 DIAGNOSIS — E1122 Type 2 diabetes mellitus with diabetic chronic kidney disease: Secondary | ICD-10-CM

## 2022-07-06 MED ORDER — TOUJEO MAX SOLOSTAR 300 UNIT/ML ~~LOC~~ SOPN: 13.0000 [IU] | PEN_INJECTOR | Freq: Every day | SUBCUTANEOUS | 1 refills | Status: DC

## 2022-07-06 MED ORDER — DEXCOM G6 TRANSMITTER MISC
99 refills | Status: DC
  Filled 2022-07-06: qty 1, 90d supply, fill #0

## 2022-07-07 ENCOUNTER — Encounter: Payer: Self-pay | Admitting: Physician Assistant

## 2022-07-07 ENCOUNTER — Ambulatory Visit (INDEPENDENT_AMBULATORY_CARE_PROVIDER_SITE_OTHER): Payer: Medicare HMO | Admitting: Physician Assistant

## 2022-07-07 VITALS — BP 140/72 | HR 109 | Ht 66.0 in | Wt 203.1 lb

## 2022-07-07 DIAGNOSIS — M5030 Other cervical disc degeneration, unspecified cervical region: Secondary | ICD-10-CM | POA: Diagnosis not present

## 2022-07-07 DIAGNOSIS — R251 Tremor, unspecified: Secondary | ICD-10-CM | POA: Diagnosis not present

## 2022-07-07 DIAGNOSIS — N1831 Chronic kidney disease, stage 3a: Secondary | ICD-10-CM

## 2022-07-07 DIAGNOSIS — M25512 Pain in left shoulder: Secondary | ICD-10-CM | POA: Diagnosis not present

## 2022-07-07 DIAGNOSIS — M25552 Pain in left hip: Secondary | ICD-10-CM

## 2022-07-07 DIAGNOSIS — R29898 Other symptoms and signs involving the musculoskeletal system: Secondary | ICD-10-CM

## 2022-07-07 DIAGNOSIS — E1122 Type 2 diabetes mellitus with diabetic chronic kidney disease: Secondary | ICD-10-CM

## 2022-07-07 DIAGNOSIS — R2689 Other abnormalities of gait and mobility: Secondary | ICD-10-CM | POA: Diagnosis not present

## 2022-07-07 DIAGNOSIS — M5412 Radiculopathy, cervical region: Secondary | ICD-10-CM | POA: Diagnosis not present

## 2022-07-07 MED ORDER — CYCLOBENZAPRINE HCL 10 MG PO TABS: 10.0000 mg | ORAL_TABLET | Freq: Three times a day (TID) | ORAL | 1 refills | Status: DC | PRN

## 2022-07-07 MED ORDER — DEXCOM G7 SENSOR MISC: 1.0000 | Freq: Every day | 5 refills | Status: DC
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
## 2022-07-08 ENCOUNTER — Other Ambulatory Visit: Payer: Self-pay

## 2022-07-13 ENCOUNTER — Ambulatory Visit: Payer: Medicare HMO | Admitting: Neurology

## 2022-07-13 ENCOUNTER — Encounter: Payer: Self-pay | Admitting: Neurology

## 2022-07-13 VITALS — BP 160/91 | HR 101 | Ht 66.0 in | Wt 203.5 lb

## 2022-07-13 DIAGNOSIS — H532 Diplopia: Secondary | ICD-10-CM

## 2022-07-13 DIAGNOSIS — R9082 White matter disease, unspecified: Secondary | ICD-10-CM | POA: Diagnosis not present

## 2022-07-13 DIAGNOSIS — G7119 Other specified myotonic disorders: Secondary | ICD-10-CM

## 2022-07-13 DIAGNOSIS — M5030 Other cervical disc degeneration, unspecified cervical region: Secondary | ICD-10-CM

## 2022-07-13 DIAGNOSIS — M4802 Spinal stenosis, cervical region: Secondary | ICD-10-CM

## 2022-07-13 MED ORDER — CARBAMAZEPINE 200 MG PO TABS: 200.0000 mg | ORAL_TABLET | Freq: Every day | ORAL | 5 refills | Status: DC

## 2022-07-13 MED ORDER — GABAPENTIN 300 MG PO CAPS: ORAL_CAPSULE | ORAL | 3 refills | Status: DC

## 2022-07-13 NOTE — Progress Notes (Signed)
[facility]  PATIENT: Nina Trujillo DOB: 23-Oct-1965  REFERRING DOCTOR OR PCP: Aundria Mems, MD SOURCE: Patient, notes from primary care, imaging and lab reports, MRI images personally reviewed.  _________________________________   HISTORICAL  CHIEF COMPLAINT:  Chief Complaint  Patient presents with   Room 10    Pt is here Alone. Pt states that things have been going crazy. Pt states that she has been having dizziness. Pt states that she has headaches. Pt states that she has had nausea.     HISTORY OF PRESENT ILLNESS:  Nina Trujillo is a 57 year old woman with episodes of diplopia and headaches over the past couple months  UPDATE 07/13/2022: She repots more difficulty with headaches and dizziness.  HA is bifrontal/bitemporal .   She has some nausea and some photophobia.    When HA present, movement worsens it.   These occur multiple times a week.  She has spells of diplopia a couple times a week that last 1-2 minutes.     One episode lasted 20 minutes.    She is concerned as some occur while driving and she needs to pull over.   Most episodes occur while driving ,reading or watching TV.   She has never had an episode while just comfortably resting.  She has neck pain and pain will go into the arms, left greater than right.  MRI has shown degenerative changes in the mid cervical spine and she appears to have a focus of myelomalacia C5-C6 where she has moderate spinal stenosis.  There is severe left foraminal narrowing at this level.  Milder degenerative changes at C4-C5 with mild spinal stenosis.  She has seen neurosurgery.  She has multiple T2/FLAIR hyperintense foci in the cerebral hemispheres and confluent foci in the pons.  Many of them have a configuration most consistent with chronic microvascular ischemic changes.  I agree that there has been some progression over time but that is very typical of chronic microvascular ischemic change as a foci  slowly become more apparent/enlarged.  It is definitely possible that some foci could also represent demyelination.  She has never had an enhancing lesion in the brain.  23 showed: The focus in the splenium that was not present on the previous MRI.  CSF did not show any oligoclonal bands.  I have discussed with her that I believe the likelihood she has MS is about 50% and would not recommend treatment unless we become more certain of that diagnosis.  ESR was 2, ANA was 1:40 but all components were negative.   TSH was normal 11/2021   Father, sister, half-sister and paternal uncle and a cousin have MS.  Vascular risk factors:   Type 2 IDDM, HTN, h/o tobacco (started 1986 - quit 2023,  mostly 1/2 ppd)  History of numbness and abnormal MRI: She had numbness in her left arm into her hand and fingers in 2020.  She has had some pain of the left arm off and on for a while but she started to experience numbness about 1 month ago that gradually worsened.   She also has achiness in her neck and upper back.  She has had neck pain off and on for several years but it is worse this year.    Imaging: MRI cervical spine 07/03/2022 showed   1. Hyperintense T2-weighted signal in the spinal cord at the C5-6 level, increased since 10/22/2020, likely myelomalacia.  --- (By my review this is unchanged to previous MRIs) 2. Unchanged moderate C5-6  spinal canal stenosis with moderate right and severe left neural foraminal stenosis. 3. Unchanged mild C4-5 spinal canal stenosis with moderate right and mild left neural foraminal stenosis.  MRI of the cervical spine 11/2019 showed a single focus at C5-C6 just below the spinal stenosis at C5-C6.  It enhanced and could have been consistent with demyelination.  T  MRI of the brain 12/03/2019 showed multiple T2/FLAIR hyperintense foci that were more consistent with chronic microvascular ischemic changes than demyelination.    MRI of the cervical spine 10/22/2020 showed that the  focus adjacent to C6 looked a little bit better but still had some enhancement.  This would be very unusual for a demyelinating plaque and would be more consistent with myelopathic changes  MRI of the brain 02/08/2022 shows T2/FLAIR hyperintense foci in the hemispheres and pons most consistent with chronic microvascular ischemic change.  However, 1 focus in the right splenium was not present on the previous MRI and has an appearance that could be more worrisome for demyelination.   Vascular risk factors:   Type 2 IDDM, HTN, 1/4 ppd smoker now (1/2 to 1 ppd x many years though).      We discussed smoking cessation.          LABS 01/2022:  ANA borderline 1:40 (speckled, cytoplasmic), dsDNA, ENA SM, RNP, Chromatin Abs, SSA, SSB, Scl70, Jo-1 , centromere Abswere all negative CSF 12/26/2021 showed 5 bands matched in serum and CSF (not the typical MS pattern).   IgG Index was normal.  Vasculitis labs were normal.     REVIEW OF SYSTEMS: Constitutional: No fevers, chills, sweats, or change in appetite Eyes: No visual changes, double vision, eye pain Ear, nose and throat: No hearing loss, ear pain, nasal congestion, sore throat Cardiovascular: No chest pain, palpitations Respiratory:  No shortness of breath at rest or with exertion.   No wheezes GastrointestinaI: No nausea, vomiting, diarrhea, abdominal pain, fecal incontinence Genitourinary:  No dysuria, urinary retention or frequency.  No nocturia. Musculoskeletal:  No neck pain, back pain Integumentary: No rash, pruritus, skin lesions Neurological: as above Psychiatric: No depression at this time.  No anxiety Endocrine: No palpitations, diaphoresis, change in appetite, change in weigh or increased thirst Hematologic/Lymphatic:  No anemia, purpura, petechiae. Allergic/Immunologic: No itchy/runny eyes, nasal congestion, recent allergic reactions, rashes  ALLERGIES: Allergies  Allergen Reactions   Penicillins Anaphylaxis   Xigduo Xr  [Dapagliflozin Pro-Metformin Er] Other (See Comments)    Yeast infection.     HOME MEDICATIONS:  Current Outpatient Medications:    AMBULATORY NON FORMULARY MEDICATION, Glucometer device and lancets and test strips.  Dx. Diabetes 250.0 controlled   Test 1 to 3 times a day., Disp: 1 Device, Rfl: 0   atorvastatin (LIPITOR) 20 MG tablet, Take 1 tablet (20 mg total) by mouth daily., Disp: 90 tablet, Rfl: 1   baclofen (LIORESAL) 10 MG tablet, Take 1 tablet (10 mg total) by mouth at bedtime as needed for muscle spasms., Disp: 90 each, Rfl: 0   Biotin w/ Vitamins C & E (HAIR/SKIN/NAILS PO), Take 1 capsule by mouth at bedtime., Disp: , Rfl:    buPROPion (WELLBUTRIN SR) 200 MG 12 hr tablet, Take 1 tablet by mouth 2 times daily., Disp: 180 tablet, Rfl: 1   carbamazepine (TEGRETOL) 200 MG tablet, Take 1 tablet (200 mg total) by mouth at bedtime., Disp: 30 tablet, Rfl: 5   Continuous Blood Gluc Sensor (DEXCOM G7 SENSOR) MISC, 1 Device by Does not apply route daily. Change every 10  days, Disp: 3 each, Rfl: 5   cyclobenzaprine (FLEXERIL) 10 MG tablet, Take 1 tablet (10 mg total) by mouth 3 (three) times daily as needed for muscle spasms., Disp: 90 tablet, Rfl: 1   escitalopram (LEXAPRO) 20 MG tablet, Take 1 tablet (20 mg total) by mouth daily. Needs appointment, Disp: 90 tablet, Rfl: 0   estradiol (ESTRACE) 2 MG tablet, Take 1 tablet by mouth daily., Disp: 90 tablet, Rfl: 1   Folic Acid-Vit Q000111Q 123456 (FOLBEE) 2.5-25-1 MG TABS tablet, Take 1 tablet by mouth daily., Disp: 90 tablet, Rfl: 3   hydrochlorothiazide (HYDRODIURIL) 25 MG tablet, Take 1 tablet (25 mg total) by mouth at bedtime, Disp: 90 tablet, Rfl: 1   insulin glargine, 2 Unit Dial, (TOUJEO MAX SOLOSTAR) 300 UNIT/ML Solostar Pen, Inject 13 Units into the skin at bedtime., Disp: 3 mL, Rfl: 1   Insulin Pen Needle (PEN NEEDLES) 31G X 6 MM MISC, Injected insulin into skin once daily., Disp: 100 each, Rfl: prn   meloxicam (MOBIC) 15 MG tablet, One tab PO  every 24 hours with a meal for 2 weeks, then once every 24 hours prn pain., Disp: 90 tablet, Rfl: 3   metFORMIN (GLUCOPHAGE) 1000 MG tablet, Take 1 tablet by mouth 2 times daily with a meal., Disp: 180 tablet, Rfl: 1   omeprazole (PRILOSEC) 40 MG capsule, Take 1 capsule (40 mg total) by mouth daily., Disp: 90 capsule, Rfl: 1   ondansetron (ZOFRAN-ODT) 8 MG disintegrating tablet, DISSOLVE ONE TABLET BY MOUTH EVERY 8 HOURS AS NEEDED FOR NAUSEA, Disp: 20 tablet, Rfl: 0   Semaglutide, 2 MG/DOSE, (OZEMPIC, 2 MG/DOSE,) 8 MG/3ML SOPN, Inject 2 mg into the skin once a week., Disp: 9 mL, Rfl: 1   triamcinolone cream (KENALOG) 0.1 %, Apply 1 application topically 2 (two) times daily. (Patient taking differently: Apply 1 application  topically 2 (two) times daily as needed (skin irritation.).), Disp: 80 g, Rfl: 0   gabapentin (NEURONTIN) 300 MG capsule, One po qAM, one po qPM and 2 po qHS, Disp: 270 capsule, Rfl: 3  PAST MEDICAL HISTORY: Past Medical History:  Diagnosis Date   Chronic kidney disease    stage 3    Depression    Diabetes mellitus without complication ([place])    Hypertension     PAST SURGICAL HISTORY: Past Surgical History:  Procedure Laterality Date   ABDOMINAL HYSTERECTOMY     both uterus and ovaries removed.    COLONOSCOPY  10 + years ago    IN [facility]-normal exam per pt    FAMILY HISTORY: Family History  Problem Relation Age of Onset   Cancer Mother        cervical, breast   Multiple sclerosis Mother    Multiple sclerosis Father    Heart attack Brother    Cancer Maternal Aunt        lung   Diabetes Maternal Aunt    Hypertension Maternal Aunt    Stroke Maternal Grandmother    Multiple sclerosis Sister    Diabetes Sister    Diabetes Maternal Aunt    Hypertension Maternal Aunt    Multiple sclerosis Paternal Uncle    Multiple sclerosis Half-Sister    Colon cancer Neg Hx    Esophageal cancer Neg Hx    Rectal cancer Neg Hx    Stomach cancer Neg Hx    Colon polyps Neg  Hx     SOCIAL HISTORY: Social History   Socioeconomic History   Marital status: Single  Spouse name: Not on file   Number of children: 2   Years of education: 10   Highest education level: Not on file  Occupational History   Not on file  Tobacco Use   Smoking status: Some Days    Packs/day: 0.50    Types: Cigarettes   Smokeless tobacco: Never  Vaping Use   Vaping Use: Never used  Substance and Sexual Activity   Alcohol use: No    Alcohol/week: 0.0 standard drinks of alcohol   Drug use: No   Sexual activity: Yes  Other Topics Concern   Not on file  Social History Narrative   Lives with daughter    Caffeine use: coffee daily   Right handed    Social Determinants of Health   Financial Resource Strain: Not on file  Food Insecurity: Not on file  Transportation Needs: Not on file  Physical Activity: Not on file  Stress: Not on file  Social Connections: Not on file  Intimate Partner Violence: Not on file       PHYSICAL EXAM  Vitals:   07/13/22 1532  BP: (!) 160/91  Pulse: (!) 101  Weight: 203 lb 8 oz (92.3 kg)  Height: '5\' 6"'$  (1.676 m)    Body mass index is 32.85 kg/m.   General: The patient is well-developed and well-nourished and in no acute distress   HEENT:  Head is [place]/AT.  Sclera are anicteric.     Neck: .  The neck is tender over the occiput and mid to lower cervical paraspinal muscles, left greater than right. ROM is mildly reduced   Skin: Extremities are without rash or  edema.   Neurologic Exam   Mental status: The patient is alert and oriented x 3 at the time of the examination. The patient has apparent normal recent and remote memory, with an apparently normal attention span and concentration ability.   Speech is normal.   Cranial nerves: Extraocular movements are full. Facial strnegth is normal.    Motor:  Muscle bulk is normal.   Tone is normal. Strength is  5 / 5 in all 4 extremities except 4++ right triceps and 4++ left triceps.    Hands normal strength.    Sensory: She has normal sensation to touch in hands and leg.s     Coordination: Cerebellar testing reveals good finger-nose-finger and heel-to-shin bilaterally.   Gait and station: Station is normal.   Gait is arthritic and tandem gait is wide..  No foot drop.  Romberg is negative.    Reflexes: Deep tendon reflexes are symmetric and normal bilaterally.   No ankle clonus.    DIAGNOSTIC DATA (LABS, IMAGING, TESTING) - I reviewed patient records, labs, notes, testing and imaging myself where available.  Lab Results  Component Value Date   WBC 11.2 (H) 06/22/2022   HGB 12.7 06/22/2022   HCT 37.8 06/22/2022   MCV 92.6 06/22/2022   PLT 199 06/22/2022      Component Value Date/Time   NA 144 06/22/2022 1135   NA 140 06/13/2013 0000   K 4.5 06/22/2022 1135   CL 108 06/22/2022 1135   CO2 32 06/22/2022 1135   GLUCOSE 108 (H) 06/22/2022 1135   BUN 9 06/22/2022 1135   CREATININE 1.15 (H) 06/22/2022 1135   CALCIUM 9.6 06/22/2022 1135   CALCIUM 10.1 06/13/2013 0000   PROT 6.7 01/26/2022 1414   ALBUMIN 4.3 12/20/2019 1058   AST 13 01/26/2022 1414   ALT 12 01/26/2022 1414   ALKPHOS  68 12/13/2014 1153   BILITOT 0.2 01/26/2022 1414   GFRNONAA 43 (L) 10/09/2020 1056   GFRAA 50 (L) 10/09/2020 1056   Lab Results  Component Value Date   CHOL 122 03/25/2021   HDL 45 (L) 03/25/2021   LDLCALC 59 03/25/2021   TRIG 97 03/25/2021   CHOLHDL 2.7 03/25/2021   Lab Results  Component Value Date   HGBA1C 6.6 (A) 04/12/2022   No results found for: "VITAMINB12" Lab Results  Component Value Date   TSH 1.89 03/25/2021       ASSESSMENT AND PLAN  Diplopia  DDD (degenerative disc disease), cervical - Plan: gabapentin (NEURONTIN) 300 MG capsule  White matter abnormality on MRI of brain  Cervical stenosis of spinal canal  Ocular neuromyotonia   Etiology of diplopia is uncertain.  Episodes are brief (one minute) - not c/w demyelination.  Could possibly be  ocular neuromyotnia and I willl have her try carbamazapine.  The dose can be adjusted up based on response. 2023, the MRI of the brain showed a new lesion in the splenium.  She has foci in the pons  very consistent with chronic microvascular ischemic change.  Most of the foci in the hemispheres are more consistent with chronic microvascular ischemic change than to demyelination, though a couple of the foci (including the splenium focus) are periventricular.  Because she also has 1 focus in the cervical spine and has a family history we have considered the possibility of multiple sclerosis.  Her CSF did not show oligoclonal bands isolated to the spinal fluid, which is seen in the past majority of patients with MS.  Therefore, I think the likelihood she has MS is about 50%.  I do not recommend any treatment without being more sure of diagnosis.    The focus at [facility] n her spinal cord is unlikely to be demyelinating -- it enhanced on 2 different MRIs separated by a year (MS lesions typically enhance less than 6 weeks).  She has seen neurosurgery and surgery is being considered. I will increase her gabapentin to help with the dysesthesias.  Hopefully this will help her headaches and to  40-minute office visit with the majority of the time spent face-to-face for history and physical, discussion/counseling and decision-making.  Additional time with record review and documentation.  This visit is part of a comprehensive longitudinal care medical relationship regarding the patients primary diagnosis of spinal stenosis, abnormal brain MRI, diplopia and related concerns.   Linetta Regner A. Felecia Shelling, MD, [facility] [id number], [id number] PM Certified in Neurology, Clinical Neurophysiology, Sleep Medicine and Neuroimaging  [facility] [address], [place], [place] [address] [phone]

## 2022-07-21 ENCOUNTER — Ambulatory Visit: Payer: Medicare HMO

## 2022-07-23 ENCOUNTER — Telehealth: Payer: Self-pay

## 2022-07-23 ENCOUNTER — Ambulatory Visit (INDEPENDENT_AMBULATORY_CARE_PROVIDER_SITE_OTHER): Payer: Medicare HMO | Admitting: Family Medicine

## 2022-07-23 VITALS — BP 129/73 | HR 94

## 2022-07-23 DIAGNOSIS — R251 Tremor, unspecified: Secondary | ICD-10-CM

## 2022-07-23 DIAGNOSIS — M5030 Other cervical disc degeneration, unspecified cervical region: Secondary | ICD-10-CM

## 2022-07-23 DIAGNOSIS — I10 Essential (primary) hypertension: Secondary | ICD-10-CM | POA: Diagnosis not present

## 2022-07-23 DIAGNOSIS — M25512 Pain in left shoulder: Secondary | ICD-10-CM

## 2022-07-23 DIAGNOSIS — M25552 Pain in left hip: Secondary | ICD-10-CM

## 2022-07-23 DIAGNOSIS — M5412 Radiculopathy, cervical region: Secondary | ICD-10-CM

## 2022-07-23 NOTE — Progress Notes (Signed)
BP at goal today and looks great.  Continue current regimen.

## 2022-07-23 NOTE — Telephone Encounter (Signed)
Vm msg left on CMA's line from mail order pharmacy. Per pharmacist, requesting if baclofen is replacing the cyclobenzaprine rx. If so, a refill request is needed. Please advise which rx the patient should be on for muscle spasms. Rx can be sent electronically, called at [phone] or faxed at [phone].

## 2022-07-23 NOTE — Progress Notes (Signed)
Pt presents to clinic today for BP check. Denies any cp/sob/palpitaions/headaches/dizziness or swelling.   She has been taking medications as directed with no side effects.

## 2022-07-27 ENCOUNTER — Other Ambulatory Visit: Payer: Self-pay | Admitting: Physician Assistant

## 2022-07-27 DIAGNOSIS — E1169 Type 2 diabetes mellitus with other specified complication: Secondary | ICD-10-CM | POA: Diagnosis not present

## 2022-07-27 DIAGNOSIS — E782 Mixed hyperlipidemia: Secondary | ICD-10-CM | POA: Diagnosis not present

## 2022-07-27 DIAGNOSIS — N183 Chronic kidney disease, stage 3 unspecified: Secondary | ICD-10-CM | POA: Diagnosis not present

## 2022-07-27 DIAGNOSIS — F331 Major depressive disorder, recurrent, moderate: Secondary | ICD-10-CM

## 2022-07-27 DIAGNOSIS — E669 Obesity, unspecified: Secondary | ICD-10-CM | POA: Diagnosis not present

## 2022-07-27 DIAGNOSIS — I10 Essential (primary) hypertension: Secondary | ICD-10-CM | POA: Diagnosis not present

## 2022-07-27 DIAGNOSIS — R112 Nausea with vomiting, unspecified: Secondary | ICD-10-CM

## 2022-07-27 DIAGNOSIS — F17200 Nicotine dependence, unspecified, uncomplicated: Secondary | ICD-10-CM | POA: Diagnosis not present

## 2022-07-27 NOTE — Telephone Encounter (Signed)
Confirm with patient which one works better for her and update medication list.

## 2022-07-30 NOTE — Telephone Encounter (Signed)
Several call attempts made to the patient. No answer. Multiple vms have been left to return a call back to the clinic for clarification of preferred muscle relaxant. Direct call back info provided.

## 2022-08-03 MED ORDER — CYCLOBENZAPRINE HCL 10 MG PO TABS: 10.0000 mg | ORAL_TABLET | Freq: Three times a day (TID) | ORAL | 0 refills | Status: DC | PRN

## 2022-08-03 NOTE — Telephone Encounter (Signed)
Received another call from [facility], since patient has not returned our phone calls, note sent to [facility] to fill Cyclobenzaprine and d/c Baclofen since most recent RX is for cyclobenzaprine.

## 2022-08-06 DIAGNOSIS — M79672 Pain in left foot: Secondary | ICD-10-CM | POA: Diagnosis not present

## 2022-08-06 DIAGNOSIS — M21612 Bunion of left foot: Secondary | ICD-10-CM | POA: Diagnosis not present

## 2022-08-17 ENCOUNTER — Other Ambulatory Visit: Payer: Self-pay | Admitting: Physician Assistant

## 2022-08-17 DIAGNOSIS — N3289 Other specified disorders of bladder: Secondary | ICD-10-CM

## 2022-08-17 DIAGNOSIS — E6609 Other obesity due to excess calories: Secondary | ICD-10-CM

## 2022-08-17 NOTE — Telephone Encounter (Signed)
-----   Message from Donella Stade, [place] sent at 02/23/2022 11:20 PM EDT ----- MRI repeat pelvic for benign cyst follow up.

## 2022-08-17 NOTE — Telephone Encounter (Signed)
Called patient, no answer. Left a detailed vm msg regarding follow up order for pelvic MRI. Direct call back information provided.

## 2022-08-18 ENCOUNTER — Telehealth: Payer: Self-pay | Admitting: Physician Assistant

## 2022-08-18 NOTE — Telephone Encounter (Signed)
Called patient to schedule Medicare Annual Wellness Visit (AWV). Left message for patient to call back and schedule Medicare Annual Wellness Visit (AWV).  Last date of AWV: Never  Please schedule an appointment at any time with [facility].  If any questions, please contact me at [phone].  Thank you ,  Morgan Jessup Patient Access Advocate II Direct Dial: [phone]  

## 2022-08-23 ENCOUNTER — Other Ambulatory Visit: Payer: Self-pay | Admitting: Physician Assistant

## 2022-08-23 ENCOUNTER — Ambulatory Visit (INDEPENDENT_AMBULATORY_CARE_PROVIDER_SITE_OTHER): Payer: Medicare HMO

## 2022-08-23 DIAGNOSIS — M25552 Pain in left hip: Secondary | ICD-10-CM

## 2022-08-23 DIAGNOSIS — M5030 Other cervical disc degeneration, unspecified cervical region: Secondary | ICD-10-CM

## 2022-08-23 DIAGNOSIS — N3289 Other specified disorders of bladder: Secondary | ICD-10-CM | POA: Diagnosis not present

## 2022-08-23 DIAGNOSIS — M25512 Pain in left shoulder: Secondary | ICD-10-CM

## 2022-08-23 DIAGNOSIS — R251 Tremor, unspecified: Secondary | ICD-10-CM

## 2022-08-23 DIAGNOSIS — M5412 Radiculopathy, cervical region: Secondary | ICD-10-CM

## 2022-08-23 DIAGNOSIS — N2889 Other specified disorders of kidney and ureter: Secondary | ICD-10-CM | POA: Diagnosis not present

## 2022-08-23 MED ORDER — GADOBUTROL 1 MMOL/ML IV SOLN
9.0000 mL | Freq: Once | INTRAVENOUS | Status: AC | PRN
  Administered 2022-08-23: 9 mL via INTRAVENOUS

## 2022-08-24 NOTE — Progress Notes (Signed)
GREAT news with MR. No solid components or suspicious enhancement. Appears like benign cysts in the pelvis.

## 2022-08-25 NOTE — Telephone Encounter (Signed)
Pt reports that she had this done on 08/23/2022.  While on the phone with her she requested a refill on Phentermine 37.5 to be sent to [facility]

## 2022-08-26 MED ORDER — PHENTERMINE HCL 37.5 MG PO TABS: 37.5000 mg | ORAL_TABLET | Freq: Every day | ORAL | 0 refills | Status: DC

## 2022-08-31 ENCOUNTER — Encounter: Payer: Self-pay | Admitting: Physician Assistant

## 2022-08-31 DIAGNOSIS — N1831 Chronic kidney disease, stage 3a: Secondary | ICD-10-CM

## 2022-08-31 MED ORDER — DEXCOM G7 SENSOR MISC
1.0000 | Freq: Every day | 5 refills | Status: DC
Start: 2022-08-31 — End: 2023-09-30

## 2022-08-31 MED ORDER — DEXCOM G6 TRANSMITTER MISC
99 refills | Status: AC
Start: 2022-08-31 — End: ?

## 2022-09-13 ENCOUNTER — Other Ambulatory Visit: Payer: Self-pay | Admitting: Physician Assistant

## 2022-09-13 DIAGNOSIS — E6609 Other obesity due to excess calories: Secondary | ICD-10-CM

## 2022-09-13 DIAGNOSIS — Z6833 Body mass index (BMI) 33.0-33.9, adult: Secondary | ICD-10-CM

## 2022-09-16 DIAGNOSIS — G35 Multiple sclerosis: Secondary | ICD-10-CM | POA: Diagnosis not present

## 2022-09-22 ENCOUNTER — Other Ambulatory Visit: Payer: Self-pay | Admitting: Physician Assistant

## 2022-09-22 DIAGNOSIS — R112 Nausea with vomiting, unspecified: Secondary | ICD-10-CM

## 2022-10-14 ENCOUNTER — Other Ambulatory Visit: Payer: Self-pay | Admitting: Physician Assistant

## 2022-10-14 DIAGNOSIS — E6609 Other obesity due to excess calories: Secondary | ICD-10-CM

## 2022-10-14 DIAGNOSIS — E66811 Other obesity due to excess calories: Secondary | ICD-10-CM

## 2022-10-14 DIAGNOSIS — E1122 Type 2 diabetes mellitus with diabetic chronic kidney disease: Secondary | ICD-10-CM

## 2022-10-14 DIAGNOSIS — N1831 Chronic kidney disease, stage 3a: Secondary | ICD-10-CM

## 2022-10-21 ENCOUNTER — Other Ambulatory Visit: Payer: Self-pay | Admitting: Physician Assistant

## 2022-10-21 DIAGNOSIS — N951 Menopausal and female climacteric states: Secondary | ICD-10-CM

## 2022-10-24 ENCOUNTER — Other Ambulatory Visit: Payer: Self-pay | Admitting: Physician Assistant

## 2022-10-24 DIAGNOSIS — E782 Mixed hyperlipidemia: Secondary | ICD-10-CM

## 2022-10-24 DIAGNOSIS — I10 Essential (primary) hypertension: Secondary | ICD-10-CM

## 2022-10-24 DIAGNOSIS — Z716 Tobacco abuse counseling: Secondary | ICD-10-CM

## 2022-10-24 DIAGNOSIS — R0789 Other chest pain: Secondary | ICD-10-CM

## 2022-10-24 DIAGNOSIS — E6609 Other obesity due to excess calories: Secondary | ICD-10-CM

## 2022-11-10 ENCOUNTER — Encounter: Payer: Self-pay | Admitting: Physician Assistant

## 2022-11-11 NOTE — Telephone Encounter (Signed)
Spoke with [facility] . They are currently in process of filling the toujeo for patient.  The DEXcom [facility] [phone]. Spoke with a representative there . They can not fill sensors so patient will need to transfer script back to pharmacy  Of her choice.  Spoke with patient and informed of above.

## 2022-12-23 ENCOUNTER — Other Ambulatory Visit: Payer: Self-pay | Admitting: Physician Assistant

## 2022-12-23 DIAGNOSIS — M5030 Other cervical disc degeneration, unspecified cervical region: Secondary | ICD-10-CM

## 2022-12-23 DIAGNOSIS — M25512 Pain in left shoulder: Secondary | ICD-10-CM

## 2022-12-23 DIAGNOSIS — R251 Tremor, unspecified: Secondary | ICD-10-CM

## 2022-12-23 DIAGNOSIS — M5412 Radiculopathy, cervical region: Secondary | ICD-10-CM

## 2022-12-23 DIAGNOSIS — M25552 Pain in left hip: Secondary | ICD-10-CM

## 2023-01-11 ENCOUNTER — Ambulatory Visit (INDEPENDENT_AMBULATORY_CARE_PROVIDER_SITE_OTHER): Payer: Medicare HMO | Admitting: Physician Assistant

## 2023-01-11 ENCOUNTER — Encounter: Payer: Self-pay | Admitting: Physician Assistant

## 2023-01-11 VITALS — BP 137/69 | HR 91 | Ht 67.0 in | Wt 196.2 lb

## 2023-01-11 DIAGNOSIS — R0683 Snoring: Secondary | ICD-10-CM | POA: Diagnosis not present

## 2023-01-11 DIAGNOSIS — I10 Essential (primary) hypertension: Secondary | ICD-10-CM | POA: Diagnosis not present

## 2023-01-11 DIAGNOSIS — G478 Other sleep disorders: Secondary | ICD-10-CM | POA: Diagnosis not present

## 2023-01-11 DIAGNOSIS — E6609 Other obesity due to excess calories: Secondary | ICD-10-CM | POA: Diagnosis not present

## 2023-01-11 DIAGNOSIS — N1831 Chronic kidney disease, stage 3a: Secondary | ICD-10-CM | POA: Diagnosis not present

## 2023-01-11 DIAGNOSIS — Z6834 Body mass index (BMI) 34.0-34.9, adult: Secondary | ICD-10-CM | POA: Diagnosis not present

## 2023-01-11 DIAGNOSIS — E119 Type 2 diabetes mellitus without complications: Secondary | ICD-10-CM

## 2023-01-11 DIAGNOSIS — R42 Dizziness and giddiness: Secondary | ICD-10-CM | POA: Diagnosis not present

## 2023-01-11 DIAGNOSIS — R531 Weakness: Secondary | ICD-10-CM | POA: Diagnosis not present

## 2023-01-11 DIAGNOSIS — Z7984 Long term (current) use of oral hypoglycemic drugs: Secondary | ICD-10-CM

## 2023-01-11 NOTE — Patient Instructions (Signed)
Will get sleep study ordered Get labs today Call if episode happens again
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
## 2023-01-12 ENCOUNTER — Other Ambulatory Visit: Payer: Self-pay | Admitting: Physician Assistant

## 2023-01-12 ENCOUNTER — Telehealth: Payer: Self-pay

## 2023-01-12 LAB — CBC WITH DIFFERENTIAL/PLATELET
Basophils Absolute: 0.1 10*3/uL (ref 0.0–0.2)
Basos: 1 %
EOS (ABSOLUTE): 0.2 10*3/uL (ref 0.0–0.4)
Eos: 2 %
Hematocrit: 43 % (ref 34.0–46.6)
Hemoglobin: 14.4 g/dL (ref 11.1–15.9)
Immature Grans (Abs): 0 10*3/uL (ref 0.0–0.1)
Immature Granulocytes: 0 %
Lymphocytes Absolute: 2.9 10*3/uL (ref 0.7–3.1)
Lymphs: 34 %
MCH: 30.2 pg (ref 26.6–33.0)
MCHC: 33.5 g/dL (ref 31.5–35.7)
MCV: 90 fL (ref 79–97)
Monocytes Absolute: 0.4 10*3/uL (ref 0.1–0.9)
Monocytes: 4 %
Neutrophils Absolute: 5 10*3/uL (ref 1.4–7.0)
Neutrophils: 59 %
Platelets: 272 10*3/uL (ref 150–450)
RBC: 4.77 x10E6/uL (ref 3.77–5.28)
RDW: 15.4 % (ref 11.7–15.4)
WBC: 8.5 10*3/uL (ref 3.4–10.8)

## 2023-01-12 LAB — CMP14+EGFR
ALT: 19 IU/L (ref 0–32)
AST: 24 IU/L (ref 0–40)
Albumin: 4.5 g/dL (ref 3.8–4.9)
Alkaline Phosphatase: 102 IU/L (ref 44–121)
BUN/Creatinine Ratio: 14 (ref 9–23)
BUN: 21 mg/dL (ref 6–24)
Bilirubin Total: 0.3 mg/dL (ref 0.0–1.2)
CO2: 23 mmol/L (ref 20–29)
Calcium: 10.2 mg/dL (ref 8.7–10.2)
Chloride: 105 mmol/L (ref 96–106)
Creatinine, Ser: 1.54 mg/dL — ABNORMAL HIGH (ref 0.57–1.00)
Globulin, Total: 2.8 g/dL (ref 1.5–4.5)
Glucose: 72 mg/dL (ref 70–99)
Potassium: 4.2 mmol/L (ref 3.5–5.2)
Sodium: 144 mmol/L (ref 134–144)
Total Protein: 7.3 g/dL (ref 6.0–8.5)
eGFR: 39 mL/min/{1.73_m2} — ABNORMAL LOW (ref >59)

## 2023-01-12 LAB — IRON,TIBC AND FERRITIN PANEL
Ferritin: 42 ng/mL (ref 15–150)
Iron Saturation: 30 % (ref 15–55)
Iron: 95 ug/dL (ref 27–159)
Total Iron Binding Capacity: 312 ug/dL (ref 250–450)
UIBC: 217 ug/dL (ref 131–425)

## 2023-01-12 LAB — TSH: TSH: 1.31 u[IU]/mL (ref 0.450–4.500)

## 2023-01-12 LAB — HEMOGLOBIN A1C
Est. average glucose Bld gHb Est-mCnc: 105 mg/dL
Hgb A1c MFr Bld: 5.3 % (ref 4.8–5.6)

## 2023-01-12 MED ORDER — LOSARTAN POTASSIUM 25 MG PO TABS: 25.0000 mg | ORAL_TABLET | Freq: Every day | ORAL | 1 refills | Status: DC

## 2023-01-12 NOTE — Progress Notes (Signed)
Nina Trujillo,   Thyroid looks great.  A1C improved! WBC normal.  Iron and iron stores look good.  Kidney function dropped a little more.  StOP mobic given by Dr. Karie Schwalbe.  Do not take any anti-inflammatories.  I think we should start cozaar to help protect your kidneys. Recheck in 4 weeks.

## 2023-01-12 NOTE — Addendum Note (Signed)
Addended by: Jomarie Longs on: 01/12/2023 04:16 PM   Modules accepted: Orders

## 2023-01-12 NOTE — Telephone Encounter (Signed)
Just letting you know about a sleep study order for this patient . Do you still send these?

## 2023-01-14 NOTE — Telephone Encounter (Signed)
Sleep study orders, clinical notes, demographics and copies of insurance cards have been faxed to [employer] at [phone].

## 2023-01-20 ENCOUNTER — Encounter: Payer: Self-pay | Admitting: Neurology

## 2023-01-20 ENCOUNTER — Ambulatory Visit (INDEPENDENT_AMBULATORY_CARE_PROVIDER_SITE_OTHER): Payer: Medicare HMO | Admitting: Neurology

## 2023-01-20 ENCOUNTER — Other Ambulatory Visit: Payer: Self-pay | Admitting: Physician Assistant

## 2023-01-20 VITALS — BP 155/79 | HR 79 | Ht 66.0 in | Wt 198.5 lb

## 2023-01-20 DIAGNOSIS — R200 Anesthesia of skin: Secondary | ICD-10-CM | POA: Diagnosis not present

## 2023-01-20 DIAGNOSIS — F331 Major depressive disorder, recurrent, moderate: Secondary | ICD-10-CM

## 2023-01-20 DIAGNOSIS — M4802 Spinal stenosis, cervical region: Secondary | ICD-10-CM | POA: Diagnosis not present

## 2023-01-20 DIAGNOSIS — G478 Other sleep disorders: Secondary | ICD-10-CM | POA: Diagnosis not present

## 2023-01-20 DIAGNOSIS — G4733 Obstructive sleep apnea (adult) (pediatric): Secondary | ICD-10-CM | POA: Diagnosis not present

## 2023-01-20 DIAGNOSIS — R29818 Other symptoms and signs involving the nervous system: Secondary | ICD-10-CM | POA: Diagnosis not present

## 2023-01-20 DIAGNOSIS — N951 Menopausal and female climacteric states: Secondary | ICD-10-CM

## 2023-01-20 DIAGNOSIS — R9082 White matter disease, unspecified: Secondary | ICD-10-CM

## 2023-01-20 MED ORDER — MODAFINIL 200 MG PO TABS: 200.0000 mg | ORAL_TABLET | Freq: Every day | ORAL | 5 refills | Status: DC

## 2023-01-20 NOTE — Progress Notes (Signed)
[facility]  PATIENT: Nina Trujillo DOB: 1966/04/16  REFERRING DOCTOR OR PCP: Rodney Langton, MD SOURCE: Patient, notes from primary care, imaging and lab reports, MRI images personally reviewed.  _________________________________   HISTORICAL  CHIEF COMPLAINT:  Chief Complaint  Patient presents with   Follow-up    Pt in room 10. Here DDD (degenerative disc disease) follow up. Pt said 2 weeks ago she woke up one morning couldn't hardly move, everything feel heavy, arms, legs, last about 45 minutes. Pt almost mentioned pain left eye comes and goes, pt mentioned eyes are red at times.      HISTORY OF PRESENT ILLNESS:  Nina Trujillo is a 57 year old woman with episodes of diplopia and headaches over the past couple months  UPDATE 01/20/2023 2 weeks ago, she woke up and felt weak, legs worse than arms.   She first felt she could not move - everything seemed heavy --  but then got out of bed.  She needed to hold the walls. Besides weakness she felt numbness and tingling in legs.    After 45 minutes she felt close to baseline again.   She had a runny nose at the time but no fever,     She notes intermittent headaches, mostly on the left.    She denies nausea but has photophobia.    When HA present, movement worsens it.   These occur 2 times a week.on average  She continues to note fluctuating diplopia.       Most episodes occur while driving ,reading or watching TV.   She has never had an episode while just comfortably resting.  She has fatigue and feels she has to talk herself into being more active.   She has snoring and EDS and is going to have a home sleep study.    She was told last time she was in hte hospital that her oxygen kept desaturating while asleep  She has neck pain and pain will go into the arms, left greater than right.  MRI has shown degenerative changes in the mid cervical spine and she appears to have a focus of myelomalacia C5-C6 where  she has moderate spinal stenosis.  There is severe left foraminal narrowing at this level.  Milder degenerative changes at C4-C5 with mild spinal stenosis.  She has seen neurosurgery.  She has less dysesthesia on the combination of gabapentin/carbamazepine.     She has multiple T2/FLAIR hyperintense foci in the cerebral hemispheres and confluent foci in the pons.  Many of them have a configuration most consistent with chronic microvascular ischemic changes.  I agree that there has been some progression over time but that is very typical of chronic microvascular ischemic change as a foci slowly become more apparent/enlarged.  It is definitely possible that some foci could also represent demyelination.  She has never had an enhancing lesion in the brain.  23 showed: The focus in the splenium that was not present on the previous MRI.  CSF did not show any oligoclonal bands.  I have discussed with her that I believe the likelihood she has MS is about 50% and would not recommend treatment unless we become more certain of that diagnosis.  ESR was 2, ANA was 1:40 but all components were negative.   TSH was normal 11/2021   Father, sister, half-sister and paternal uncle and a cousin have MS.  Vascular risk factors:   Type 2 IDDM, HTN, h/o tobacco (started 1986 - quit 2023,  mostly  1/2 ppd)  History of numbness and abnormal MRI: She had numbness in her left arm into her hand and fingers in 2020.  She has had some pain of the left arm off and on for a while but she started to experience numbness about 1 month ago that gradually worsened.   She also has achiness in her neck and upper back.  She has had neck pain off and on for several years but it is worse this year.    Imaging: MRI cervical spine 07/03/2022 showed   1. Hyperintense T2-weighted signal in the spinal cord at the C5-6 level, increased since 10/22/2020, likely myelomalacia.  --- (By my review this is unchanged to previous MRIs) 2. Unchanged  moderate C5-6 spinal canal stenosis with moderate right and severe left neural foraminal stenosis. 3. Unchanged mild C4-5 spinal canal stenosis with moderate right and mild left neural foraminal stenosis.  MRI of the cervical spine 11/2019 showed a single focus at C5-C6 just below the spinal stenosis at C5-C6.  It enhanced and could have been consistent with demyelination.  T  MRI of the brain 12/03/2019 showed multiple T2/FLAIR hyperintense foci that were more consistent with chronic microvascular ischemic changes than demyelination.    MRI of the cervical spine 10/22/2020 showed that the focus adjacent to C6 looked a little bit better but still had some enhancement.  This would be very unusual for a demyelinating plaque and would be more consistent with myelopathic changes  MRI of the brain 02/08/2022 shows T2/FLAIR hyperintense foci in the hemispheres and pons most consistent with chronic microvascular ischemic change.  However, 1 focus in the right splenium was not present on the previous MRI and has an appearance that could be more worrisome for demyelination.   Vascular risk factors:   Type 2 IDDM, HTN, 1/4 ppd smoker now (1/2 to 1 ppd x many years though).      We discussed smoking cessation.          LABS 01/2022:  ANA borderline 1:40 (speckled, cytoplasmic), dsDNA, ENA SM, RNP, Chromatin Abs, SSA, SSB, Scl70, Jo-1 , centromere Abswere all negative CSF 12/26/2021 showed 5 bands matched in serum and CSF (not the typical MS pattern).   IgG Index was normal.  Vasculitis labs were normal.     REVIEW OF SYSTEMS: Constitutional: No fevers, chills, sweats, or change in appetite Eyes: No visual changes, double vision, eye pain Ear, nose and throat: No hearing loss, ear pain, nasal congestion, sore throat Cardiovascular: No chest pain, palpitations Respiratory:  No shortness of breath at rest or with exertion.   No wheezes GastrointestinaI: No nausea, vomiting, diarrhea, abdominal pain, fecal  incontinence Genitourinary:  No dysuria, urinary retention or frequency.  No nocturia. Musculoskeletal:  No neck pain, back pain Integumentary: No rash, pruritus, skin lesions Neurological: as above Psychiatric: No depression at this time.  No anxiety Endocrine: No palpitations, diaphoresis, change in appetite, change in weigh or increased thirst Hematologic/Lymphatic:  No anemia, purpura, petechiae. Allergic/Immunologic: No itchy/runny eyes, nasal congestion, recent allergic reactions, rashes  ALLERGIES: Allergies  Allergen Reactions   Penicillins Anaphylaxis   Xigduo Xr [Dapagliflozin Pro-Metformin Er] Other (See Comments)    Yeast infection.     HOME MEDICATIONS:  Current Outpatient Medications:    AMBULATORY NON FORMULARY MEDICATION, Glucometer device and lancets and test strips.  Dx. Diabetes 250.0 controlled   Test 1 to 3 times a day., Disp: 1 Device, Rfl: 0   atorvastatin (LIPITOR) 20 MG tablet, TAKE ONE TABLET (  20 MG TOTAL) BY MOUTH DAILY AT 5PM, Disp: 90 tablet, Rfl: 11   Biotin w/ Vitamins C & E (HAIR/SKIN/NAILS PO), Take 1 capsule by mouth at bedtime., Disp: , Rfl:    buPROPion (WELLBUTRIN SR) 200 MG 12 hr tablet, TAKE ONE TABLET BY MOUTH TWICE DAILY @9AM  & 9PM, Disp: 180 tablet, Rfl: 11   carbamazepine (TEGRETOL) 200 MG tablet, Take 1 tablet (200 mg total) by mouth at bedtime., Disp: 30 tablet, Rfl: 5   Continuous Glucose Sensor (DEXCOM G7 SENSOR) MISC, 1 Device by Does not apply route daily. Change every 10 days, Disp: 3 each, Rfl: 5   Continuous Glucose Transmitter (DEXCOM G6 TRANSMITTER) MISC, Use for continuous glucose, Disp: 1 each, Rfl: PRN   cyclobenzaprine (FLEXERIL) 10 MG tablet, TAKE ONE TABLET (10MG  TOTAL) BY MOUTH THREE TIMES DAILY AS NEEDED FOR MUSCLE SPASMS, Disp: 90 tablet, Rfl: 11   Folic Acid-Vit B6-Vit B12 (FOLBEE) 2.5-25-1 MG TABS tablet, Take 1 tablet by mouth daily., Disp: 90 tablet, Rfl: 3   gabapentin (NEURONTIN) 300 MG capsule, One po qAM, one po qPM  and 2 po qHS, Disp: 270 capsule, Rfl: 3   hydrochlorothiazide (HYDRODIURIL) 25 MG tablet, TAKE ONE TABLET (25 mg total) BY MOUTH DAILY AT 9PM AT BEDTIME, Disp: 90 tablet, Rfl: 11   insulin glargine, 2 Unit Dial, (TOUJEO MAX SOLOSTAR) 300 UNIT/ML Solostar Pen, Inject 13 Units into the skin at bedtime., Disp: 3 mL, Rfl: 1   Insulin Pen Needle (PEN NEEDLES) 31G X 6 MM MISC, Injected insulin into skin once daily., Disp: 100 each, Rfl: prn   losartan (COZAAR) 25 MG tablet, Take 1 tablet (25 mg total) by mouth daily., Disp: 30 tablet, Rfl: 1   metFORMIN (GLUCOPHAGE) 1000 MG tablet, Take 1 tablet by mouth 2 times daily with a meal., Disp: 180 tablet, Rfl: 1   modafinil (PROVIGIL) 200 MG tablet, Take 1 tablet (200 mg total) by mouth daily., Disp: 30 tablet, Rfl: 5   omeprazole (PRILOSEC) 40 MG capsule, TAKE ONE CAPSULE (40 mg total) BY MOUTH DAILY AT 9AM, Disp: 90 capsule, Rfl: 11   ondansetron (ZOFRAN-ODT) 8 MG disintegrating tablet, DISSOLVE ONE TABLET BY MOUTH EVERY 8 HOURS AS NEEDED FOR NAUSEA, Disp: 20 tablet, Rfl: 11   OZEMPIC, 2 MG/DOSE, 8 MG/3ML SOPN, INJECT 2 MG UNDER THE SKIN ONCE A WEEK, Disp: 9 mL, Rfl: 1   phentermine (ADIPEX-P) 37.5 MG tablet, Take 1 tablet (37.5 mg total) by mouth daily before breakfast., Disp: 30 tablet, Rfl: 0   triamcinolone cream (KENALOG) 0.1 %, Apply 1 application topically 2 (two) times daily. (Patient taking differently: Apply 1 application  topically 2 (two) times daily as needed (skin irritation.).), Disp: 80 g, Rfl: 0   escitalopram (LEXAPRO) 20 MG tablet, TAKE ONE TABLET (20 MG TOTAL) BY MOUTH DAILY AT 9AM NEEDS APPOINTMENT, Disp: 90 tablet, Rfl: 11   estradiol (ESTRACE) 2 MG tablet, TAKE ONE TABLET BY MOUTH DAILY, Disp: 90 tablet, Rfl: 11  PAST MEDICAL HISTORY: Past Medical History:  Diagnosis Date   Chronic kidney disease    stage 3    Depression    Diabetes mellitus without complication (HCC)    Hypertension     PAST SURGICAL HISTORY: Past Surgical  History:  Procedure Laterality Date   ABDOMINAL HYSTERECTOMY     both uterus and ovaries removed.    COLONOSCOPY  10 + years ago    IN [facility]-normal exam per pt    FAMILY HISTORY: Family History  Problem Relation  Age of Onset   Cancer Mother        cervical, breast   Multiple sclerosis Mother    Multiple sclerosis Father    Heart attack Brother    Cancer Maternal Aunt        lung   Diabetes Maternal Aunt    Hypertension Maternal Aunt    Stroke Maternal Grandmother    Multiple sclerosis Sister    Diabetes Sister    Diabetes Maternal Aunt    Hypertension Maternal Aunt    Multiple sclerosis Paternal Uncle    Multiple sclerosis Half-Sister    Colon cancer Neg Hx    Esophageal cancer Neg Hx    Rectal cancer Neg Hx    Stomach cancer Neg Hx    Colon polyps Neg Hx     SOCIAL HISTORY: Social History   Socioeconomic History   Marital status: Single    Spouse name: Not on file   Number of children: 2   Years of education: 10   Highest education level: Not on file  Occupational History   Not on file  Tobacco Use   Smoking status: Every Day    Current packs/day: 0.50    Types: Cigarettes   Smokeless tobacco: Never  Vaping Use   Vaping status: Never Used  Substance and Sexual Activity   Alcohol use: No    Alcohol/week: 0.0 standard drinks of alcohol   Drug use: No   Sexual activity: Yes  Other Topics Concern   Not on file  Social History Narrative   Lives with daughter    Caffeine use: coffee daily   Right handed    Social Determinants of Health   Financial Resource Strain: Not on file  Food Insecurity: Not on file  Transportation Needs: Not on file  Physical Activity: Not on file  Stress: Not on file  Social Connections: Not on file  Intimate Partner Violence: Not on file       PHYSICAL EXAM  Vitals:   01/20/23 1242 01/20/23 1247  BP: (!) 146/82 (!) 155/79  Pulse: 99 79  Weight: 198 lb 8 oz (90 kg)   Height: 5\' 6"  (1.676 m)     Body mass  index is 32.04 kg/m.   General: The patient is well-developed and well-nourished and in no acute distress   HEENT:  Head is [place]/AT.  Sclera are anicteric.     Neck: .   She has mild neck tenderness.  The neck has a mildly reduced range of motion.   Skin: Extremities are without rash or  edema.   Neurologic Exam   Mental status: The patient is alert and oriented x 3 at the time of the examination. The patient has apparent normal recent and remote memory, with an apparently normal attention span and concentration ability.   Speech is normal.   Cranial nerves: Extraocular movements are full. Facial strnegth is normal.    Motor:  Muscle bulk is normal.   Tone is normal. Strength is  5 / 5 in all 4 extremities except 4++ right triceps and 4++ left triceps.   Hands normal strength.    Sensory: She has normal sensation to touch in hands and leg.s     Coordination: Cerebellar testing reveals good finger-nose-finger and heel-to-shin bilaterally.   Gait and station: Station is normal.   Gait is arthritic and tandem gait is wide..  No foot drop.  Romberg is negative.    Reflexes: Deep tendon reflexes are symmetric and normal bilaterally.  No ankle clonus.    DIAGNOSTIC DATA (LABS, IMAGING, TESTING) - I reviewed patient records, labs, notes, testing and imaging myself where available.  Lab Results  Component Value Date   WBC 8.5 01/11/2023   HGB 14.4 01/11/2023   HCT 43.0 01/11/2023   MCV 90 01/11/2023   PLT 272 01/11/2023      Component Value Date/Time   NA 144 01/11/2023 1145   K 4.2 01/11/2023 1145   CL 105 01/11/2023 1145   CO2 23 01/11/2023 1145   GLUCOSE 72 01/11/2023 1145   GLUCOSE 108 (H) 06/22/2022 1135   BUN 21 01/11/2023 1145   CREATININE 1.54 (H) 01/11/2023 1145   CREATININE 1.15 (H) 06/22/2022 1135   CALCIUM 10.2 01/11/2023 1145   CALCIUM 10.1 06/13/2013 0000   PROT 7.3 01/11/2023 1145   ALBUMIN 4.5 01/11/2023 1145   ALBUMIN 4.3 12/20/2019 1058   AST 24  01/11/2023 1145   ALT 19 01/11/2023 1145   ALKPHOS 102 01/11/2023 1145   BILITOT 0.3 01/11/2023 1145   GFRNONAA 43 (L) 10/09/2020 1056   GFRAA 50 (L) 10/09/2020 1056   Lab Results  Component Value Date   CHOL 122 03/25/2021   HDL 45 (L) 03/25/2021   LDLCALC 59 03/25/2021   TRIG 97 03/25/2021   CHOLHDL 2.7 03/25/2021   Lab Results  Component Value Date   HGBA1C 5.3 01/11/2023   No results found for: "VITAMINB12" Lab Results  Component Value Date   TSH 1.310 01/11/2023       ASSESSMENT AND PLAN  White matter abnormality on MRI of brain - Plan: Ambulatory referral to Neurology  Non-restorative sleep  Cervical stenosis of spinal canal  Transient neurologic deficit  OSA (obstructive sleep apnea)  Numbness - Plan: Ambulatory referral to Neurology   We had a long conversation about her symptoms in relationship to her MRIs.  I am concerned that her recent episode of transient numbness and weakness in the legs might be related to her cervical spinal stenosis.   The episode is too short in duration to be a demyelinating event.  She is a diagnostic conundrum.  The MRI of the brain shows multiple T2/FLAIR hyperintense foci but the vast majority have an appearance classic for chronic microvascular ischemic change rather than demyelination.  There was only 1 new lesion between 2021 and 2023 but it was actually periventricular in the splenium on the right.  The MRI of the cervical spine shows spinal stenosis at a couple levels and a T2 hyperintense focus posterolaterally to the left.  That position in the spinal cord could potentially be seen with demyelination like MS.  However, the focus enhances on 2 MRIs separated by a year.  MS lesions should only enhance for 4 to 8 weeks..  Lesions associated with compressive myelopathy can potentially enhance for long period of time.  On the other hand, myelopathy foci are typically off-center in the gray matter rather than posterolateral.  Further  muddying the waters, her CSF showed 5 oligoclonal bands that were also present in the serum which is a nonspecific pattern rather than the MS specific pattern. I feel the likelihood that this represents MS is only about 30-50% and therefore I am holding off on treatment.  We did discuss her getting a second opinion from another MS specialist to get a fresh set of eyes. She likely has obstructive sleep apnea.  I will add modafinil to help with her fatigue/sleepiness.  She reports that she will be getting a sleep  study soon. Continue gabapentin and carbamazepine 2 help with the dysesthesias.  5.   Return in 6 months or sooner for new or worsening neurologic symptoms.  42-minute office visit with the majority of the time spent face-to-face for history and physical, discussion/counseling and decision-making.  Additional time with record review and documentation.  This visit is part of a comprehensive longitudinal care medical relationship regarding the patients primary diagnosis of spinal stenosis, abnormal brain MRI, diplopia and related concerns.   Azlan Hanway A. Epimenio Foot, MD, [facility] 01/20/2023, 2:20 PM Certified in Neurology, Clinical Neurophysiology, Sleep Medicine and Neuroimaging  [facility] [address], [address] [place], [place] [address] [phone]

## 2023-01-25 ENCOUNTER — Telehealth: Payer: Self-pay | Admitting: Physician Assistant

## 2023-01-25 ENCOUNTER — Telehealth: Payer: Self-pay | Admitting: Neurology

## 2023-01-25 DIAGNOSIS — M5030 Other cervical disc degeneration, unspecified cervical region: Secondary | ICD-10-CM

## 2023-01-25 DIAGNOSIS — E6609 Other obesity due to excess calories: Secondary | ICD-10-CM

## 2023-01-25 DIAGNOSIS — R112 Nausea with vomiting, unspecified: Secondary | ICD-10-CM

## 2023-01-25 DIAGNOSIS — N1831 Chronic kidney disease, stage 3a: Secondary | ICD-10-CM

## 2023-01-25 DIAGNOSIS — E1122 Type 2 diabetes mellitus with diabetic chronic kidney disease: Secondary | ICD-10-CM

## 2023-01-25 DIAGNOSIS — Z716 Tobacco abuse counseling: Secondary | ICD-10-CM

## 2023-01-25 NOTE — Telephone Encounter (Addendum)
Last seen on 01/20/23 Follow up scheduled on 08/08/23 Both medications were dispensed on 01/12/23, no refill is needed now per patient. Pt said she just received a shipment of medications but she is not sure if its the below.  Either way pt does not need a refill at this time. Pt verbalized she understood.

## 2023-01-25 NOTE — Telephone Encounter (Signed)
Prescription Request  01/25/2023  LOV: 01/11/2023  What is the name of the medication or equipment?  buPROPion (WELLBUTRIN SR) 200 MG 12 hr tablet OZEMPIC, 2 MG/DOSE, 8 MG/3ML SOPN  ondansetron (ZOFRAN-ODT) 8 MG disintegrating tablet  metFORMIN (GLUCOPHAGE) 1000 MG tablet   Have you contacted your pharmacy to request a refill? Yes   Which pharmacy would you like this sent to?   [facility] [place] [address] [place] [address] Phone: [phone] Fax: [phone]      Patient notified that their request is being sent to the clinical staff for review and that they should receive a response within 2 business days.   Please advise at [facility] [phone]

## 2023-01-25 NOTE — Telephone Encounter (Signed)
Pt is requesting a refill for gabapentin (NEURONTIN) 300 MG capsule, Folic Acid-Vit B6-Vit B12 (FOLBEE) 2.5-25-1 MG TABS tablet  .  Pharmacy: [facility]

## 2023-01-25 NOTE — Telephone Encounter (Signed)
Neurology referral faxed to [facility] (fax# [phone], phone# [phone]) 

## 2023-01-26 ENCOUNTER — Telehealth: Payer: Self-pay

## 2023-01-26 ENCOUNTER — Other Ambulatory Visit (HOSPITAL_COMMUNITY): Payer: Self-pay

## 2023-01-26 NOTE — Telephone Encounter (Signed)
Pharmacy Patient Advocate Encounter  Received notification from [facility] that Prior Authorization for Modafinil 200MG  tablets has been APPROVED from 05/17/2022 to 05/17/2023. Ran test claim, Copay is $0 per 30DS. This test claim was processed through [facility]- copay amounts may vary at other pharmacies due to pharmacy/plan contracts, or as the patient moves through the different stages of their insurance plan.   PA #/Case ID/Reference #: PA Case ID #: [id number]

## 2023-01-26 NOTE — Telephone Encounter (Signed)
Pharmacy Patient Advocate Encounter   Received notification from Fax that prior authorization for Modafinil 200MG  tablets is required/requested.   Insurance verification completed.   The patient is insured through [employer] .   Per test claim: PA required; PA submitted to [facility] via CoverMyMeds Key/confirmation #/EOC [id number] Status is pending

## 2023-01-28 DIAGNOSIS — E1169 Type 2 diabetes mellitus with other specified complication: Secondary | ICD-10-CM | POA: Diagnosis not present

## 2023-01-28 DIAGNOSIS — E1122 Type 2 diabetes mellitus with diabetic chronic kidney disease: Secondary | ICD-10-CM | POA: Diagnosis not present

## 2023-01-28 DIAGNOSIS — Z794 Long term (current) use of insulin: Secondary | ICD-10-CM | POA: Diagnosis not present

## 2023-01-28 DIAGNOSIS — N183 Chronic kidney disease, stage 3 unspecified: Secondary | ICD-10-CM | POA: Diagnosis not present

## 2023-01-28 DIAGNOSIS — I10 Essential (primary) hypertension: Secondary | ICD-10-CM | POA: Diagnosis not present

## 2023-01-28 DIAGNOSIS — Z7984 Long term (current) use of oral hypoglycemic drugs: Secondary | ICD-10-CM | POA: Diagnosis not present

## 2023-01-28 DIAGNOSIS — Z7985 Long-term (current) use of injectable non-insulin antidiabetic drugs: Secondary | ICD-10-CM | POA: Diagnosis not present

## 2023-01-28 DIAGNOSIS — E669 Obesity, unspecified: Secondary | ICD-10-CM | POA: Diagnosis not present

## 2023-01-28 DIAGNOSIS — I129 Hypertensive chronic kidney disease with stage 1 through stage 4 chronic kidney disease, or unspecified chronic kidney disease: Secondary | ICD-10-CM | POA: Diagnosis not present

## 2023-01-28 DIAGNOSIS — E782 Mixed hyperlipidemia: Secondary | ICD-10-CM | POA: Diagnosis not present

## 2023-02-02 DIAGNOSIS — G4730 Sleep apnea, unspecified: Secondary | ICD-10-CM | POA: Diagnosis not present

## 2023-02-09 MED ORDER — OZEMPIC (2 MG/DOSE) 8 MG/3ML ~~LOC~~ SOPN
3.0000 mL | PEN_INJECTOR | SUBCUTANEOUS | 1 refills | Status: DC
Start: 2023-02-09 — End: 2023-04-26

## 2023-02-09 MED ORDER — ONDANSETRON 8 MG PO TBDP: 8.0000 mg | ORAL_TABLET | Freq: Every day | ORAL | 11 refills | Status: DC

## 2023-02-09 MED ORDER — METFORMIN HCL 1000 MG PO TABS
1000.0000 mg | ORAL_TABLET | Freq: Two times a day (BID) | ORAL | 1 refills | Status: DC
Start: 2023-02-09 — End: 2023-08-17

## 2023-02-09 MED ORDER — BUPROPION HCL ER (SR) 200 MG PO TB12: 200.0000 mg | ORAL_TABLET | Freq: Two times a day (BID) | ORAL | 0 refills | Status: DC

## 2023-02-09 NOTE — Telephone Encounter (Signed)
Task completed refills sent

## 2023-02-13 ENCOUNTER — Other Ambulatory Visit: Payer: Self-pay | Admitting: Neurology

## 2023-02-14 ENCOUNTER — Encounter: Payer: Self-pay | Admitting: Physician Assistant

## 2023-02-14 ENCOUNTER — Other Ambulatory Visit: Payer: Self-pay | Admitting: Physician Assistant

## 2023-02-14 DIAGNOSIS — E66811 Obesity, class 1: Secondary | ICD-10-CM

## 2023-02-14 NOTE — Telephone Encounter (Signed)
Last seen on 01/20/23 Follow up scheduled on 08/08/23 Rx last filled in 03/2022

## 2023-02-15 MED ORDER — DOXYCYCLINE HYCLATE 100 MG PO TABS
100.0000 mg | ORAL_TABLET | Freq: Two times a day (BID) | ORAL | 0 refills | Status: DC
Start: 2023-02-15 — End: 2023-05-03

## 2023-02-17 DIAGNOSIS — H2513 Age-related nuclear cataract, bilateral: Secondary | ICD-10-CM | POA: Diagnosis not present

## 2023-02-17 DIAGNOSIS — Z794 Long term (current) use of insulin: Secondary | ICD-10-CM | POA: Diagnosis not present

## 2023-02-17 DIAGNOSIS — H52223 Regular astigmatism, bilateral: Secondary | ICD-10-CM | POA: Diagnosis not present

## 2023-02-17 DIAGNOSIS — H524 Presbyopia: Secondary | ICD-10-CM | POA: Diagnosis not present

## 2023-02-17 DIAGNOSIS — E119 Type 2 diabetes mellitus without complications: Secondary | ICD-10-CM | POA: Diagnosis not present

## 2023-02-17 LAB — HM DIABETES EYE EXAM

## 2023-02-19 DIAGNOSIS — Z20822 Contact with and (suspected) exposure to covid-19: Secondary | ICD-10-CM | POA: Diagnosis not present

## 2023-02-19 DIAGNOSIS — Z87891 Personal history of nicotine dependence: Secondary | ICD-10-CM | POA: Diagnosis not present

## 2023-02-19 DIAGNOSIS — R930 Abnormal findings on diagnostic imaging of skull and head, not elsewhere classified: Secondary | ICD-10-CM | POA: Diagnosis not present

## 2023-02-19 DIAGNOSIS — R519 Headache, unspecified: Secondary | ICD-10-CM | POA: Diagnosis not present

## 2023-02-21 ENCOUNTER — Encounter: Payer: Self-pay | Admitting: Physician Assistant

## 2023-02-21 DIAGNOSIS — G4730 Sleep apnea, unspecified: Secondary | ICD-10-CM

## 2023-02-21 NOTE — Telephone Encounter (Signed)
Patient scheduled.

## 2023-02-22 ENCOUNTER — Ambulatory Visit (INDEPENDENT_AMBULATORY_CARE_PROVIDER_SITE_OTHER): Payer: Medicare HMO | Admitting: Physician Assistant

## 2023-02-22 ENCOUNTER — Encounter: Payer: Self-pay | Admitting: Physician Assistant

## 2023-02-22 VITALS — BP 151/89 | HR 87 | Ht 66.0 in | Wt 200.0 lb

## 2023-02-22 DIAGNOSIS — R519 Headache, unspecified: Secondary | ICD-10-CM

## 2023-02-22 DIAGNOSIS — N1831 Chronic kidney disease, stage 3a: Secondary | ICD-10-CM

## 2023-02-22 DIAGNOSIS — Z23 Encounter for immunization: Secondary | ICD-10-CM

## 2023-02-22 DIAGNOSIS — E1122 Type 2 diabetes mellitus with diabetic chronic kidney disease: Secondary | ICD-10-CM | POA: Diagnosis not present

## 2023-02-22 DIAGNOSIS — I10 Essential (primary) hypertension: Secondary | ICD-10-CM

## 2023-02-22 DIAGNOSIS — M5030 Other cervical disc degeneration, unspecified cervical region: Secondary | ICD-10-CM

## 2023-02-22 DIAGNOSIS — Z7984 Long term (current) use of oral hypoglycemic drugs: Secondary | ICD-10-CM

## 2023-02-22 DIAGNOSIS — L732 Hidradenitis suppurativa: Secondary | ICD-10-CM | POA: Diagnosis not present

## 2023-02-22 NOTE — Progress Notes (Unsigned)
Established Patient Office Visit  Subjective   Patient ID: Hollee Lamberton, female    DOB: [birth year]  Age: 57 y.o. MRN: [id number]  Chief Complaint  Patient presents with  . Medical Management of Chronic Issues    Htn headaches    HPI  Not helping with smoking Ozempic   Cozaar   [facility] clinic [facility] nephrology  {History (Optional):23778}  ROS    Objective:     There were no vitals taken for this visit. {Vitals History (Optional):23777}  Physical Exam   No results found for any visits on 02/22/23.  {Labs (Optional):23779}  The 10-year ASCVD risk score (Arnett DK, et al., 2019) is: 47.5%    Assessment & Plan:   Problem List Items Addressed This Visit   None   No follow-ups on file.    Tandy Gaw, PA-C

## 2023-02-22 NOTE — Patient Instructions (Addendum)
Cozaar increase to 50mg  daily and recheck BP in 2 weeks with nurse visit.   Chronic Kidney Disease, Adult Chronic kidney disease (CKD) occurs when the kidneys are slowly and permanently damaged over a long period of time. The kidneys are a pair of organs that do many important jobs in the body, including: Removing waste and extra fluid from the blood to make urine. Making hormones that maintain the amount of fluid in tissues and blood vessels. Maintaining the right amount of fluids and chemicals in the body. A small amount of kidney damage may not cause problems, but a large amount of damage may make it hard or impossible for the kidneys to work right. Steps must be taken to slow kidney damage or to stop it from getting worse. If steps are not taken, the kidneys may stop working permanently (end-stage renal disease, or ESRD). Most of the time, CKD does not go away, but it can often be controlled. People who have CKD are usually able to live full lives. What are the causes? The most common causes of this condition are diabetes and high blood pressure (hypertension). Other causes include: Cardiovascular diseases. These affect the heart and blood vessels. Kidney diseases. These include: Glomerulonephritis, or inflammation of the tiny filters in the kidneys. Interstitial nephritis. This is swelling of the small tubes of the kidneys and of the surrounding structures. Polycystic kidney disease, in which clusters of fluid-filled sacs form within the kidneys. Renal vascular disease. This includes disorders that affect the arteries and veins of the kidneys. Diseases that affect the body's defense system (immune system). A problem with urine flow. This may be caused by: Kidney stones. Cancer. An enlarged prostate, in males. A kidney infection or urinary tract infection (UTI) that keeps coming back. Vasculitis. This is swelling or inflammation of the blood vessels. What increases the risk? Your chances  of having kidney disease increase with age. The following factors may make you more likely to develop this condition: A family history of kidney disease or kidney failure. Kidney failure means the kidneys can no longer work right. Certain genetic diseases. Taking medicines often that are damaging to the kidneys. Being around or being in contact with toxic substances. Obesity. A history of tobacco use. What are the signs or symptoms? Symptoms of this condition include: Feeling very tired (lethargic) and having less energy. Swelling, or edema, of the face, legs, ankles, or feet. Nausea or vomiting, or loss of appetite. Confusion or trouble concentrating. Muscle twitches and cramps, especially in the legs. Dry, itchy skin. A metallic taste in the mouth. Producing less urine, or producing more urine (especially at night). Shortness of breath. Trouble sleeping. CKD may also result in not having enough red blood cells or hemoglobin in the blood (anemia) or having weak bones (bone disease). Symptoms develop slowly and may not be obvious until the kidney damage becomes severe. It is possible to have kidney disease for years without having symptoms. How is this diagnosed? This condition may be diagnosed based on: Blood tests. Urine tests. Imaging tests, such as an ultrasound or a CT scan. A kidney biopsy. This involves removing a sample of kidney tissue to be looked at under a microscope. Results from these tests will help to determine how serious the CKD is. How is this treated? There is no cure for most cases of this condition, but treatment usually relieves symptoms and prevents or slows the worsening of the disease. Treatment may include: Diet changes, which may require you to  avoid alcohol and foods that are high in salt, potassium, phosphorous, and protein. Medicines. These may: Lower blood pressure. Control blood sugar (glucose). Relieve anemia. Relieve swelling. Protect your  bones. Improve the balance of salts and minerals in your blood (electrolytes). Dialysis, which is a type of treatment that removes toxic waste from the body. It may be needed if you have kidney failure. Managing any other conditions that are causing your CKD or making it worse. Follow these instructions at home: Medicines Take over-the-counter and prescription medicines only as told by your health care provider. The amount of some medicines that you take may need to be changed. Do not take any new medicines unless approved by your health care provider. Many medicines can make kidney damage worse. Do not take any vitamin and mineral supplements unless approved by your health care provider. Many nutritional supplements can make kidney damage worse. Lifestyle  Do not use any products that contain nicotine or tobacco, such as cigarettes, e-cigarettes, and chewing tobacco. If you need help quitting, ask your health care provider. If you drink alcohol: Limit how much you use to: 0-1 drink a day for women who are not pregnant. 0-2 drinks a day for men. Know how much alcohol is in your drink. In the [place] one drink equals one 12 oz bottle of beer (355 mL), one 5 oz glass of wine (148 mL), or one 1 oz glass of hard liquor (44 mL). Maintain a healthy weight. If you need help, ask your health care provider. General instructions  Follow instructions from your health care provider about eating or drinking restrictions, including any prescribed diet. Track your blood pressure at home. Report changes in your blood pressure as told. If you are being treated for diabetes, track your blood glucose levels as told. Start or continue an exercise plan. Exercise at least 30 minutes a day, 5 days a week. Keep your immunizations up to date as told. Keep all follow-up visits. This is important. Where to find more information American Association of Kidney Patients: ResidentialShow.is [employer]:  www.kidney.org American Kidney Fund: FightingMatch.com.ee Life Options: www.lifeoptions.org Kidney School: www.kidneyschool.org Contact a health care provider if: Your symptoms get worse. You develop new symptoms. Get help right away if: You develop symptoms of ESRD. These include: Headaches. Numbness in your hands or feet. Easy bruising. Frequent hiccups. Chest pain. Shortness of breath. Lack of menstrual periods, in women. You have a fever. You are producing less urine than usual. You have pain or bleeding when you urinate or when you have a bowel movement. These symptoms may represent a serious problem that is an emergency. Do not wait to see if the symptoms will go away. Get medical help right away. Call your local emergency services (911 in the [place] Do not drive yourself to the hospital. Summary Chronic kidney disease (CKD) occurs when the kidneys become damaged slowly over a long period of time. The most common causes of this condition are diabetes and high blood pressure (hypertension). There is no cure for most cases of CKD, but treatment usually relieves symptoms and prevents or slows the worsening of the disease. Treatment may include a combination of lifestyle changes, medicines, and dialysis. This information is not intended to replace advice given to you by your health care provider. Make sure you discuss any questions you have with your health care provider. Document Revised: 08/03/2019 Document Reviewed: 08/08/2019 Elsevier Patient Education  2024 [employer].

## 2023-02-23 ENCOUNTER — Encounter: Payer: Self-pay | Admitting: Physician Assistant

## 2023-02-23 DIAGNOSIS — R519 Headache, unspecified: Secondary | ICD-10-CM | POA: Insufficient documentation

## 2023-02-23 DIAGNOSIS — Z0100 Encounter for examination of eyes and vision without abnormal findings: Secondary | ICD-10-CM | POA: Diagnosis not present

## 2023-02-24 DIAGNOSIS — G4730 Sleep apnea, unspecified: Secondary | ICD-10-CM | POA: Insufficient documentation

## 2023-02-25 ENCOUNTER — Telehealth: Payer: Self-pay

## 2023-02-25 DIAGNOSIS — R0683 Snoring: Secondary | ICD-10-CM

## 2023-02-25 DIAGNOSIS — G478 Other sleep disorders: Secondary | ICD-10-CM

## 2023-02-25 DIAGNOSIS — G4733 Obstructive sleep apnea (adult) (pediatric): Secondary | ICD-10-CM

## 2023-02-25 NOTE — Telephone Encounter (Signed)
C-pap orders placed per pcp

## 2023-02-28 ENCOUNTER — Ambulatory Visit: Payer: Medicare HMO | Admitting: Physician Assistant

## 2023-03-02 DIAGNOSIS — R519 Headache, unspecified: Secondary | ICD-10-CM | POA: Diagnosis not present

## 2023-03-02 DIAGNOSIS — I10 Essential (primary) hypertension: Secondary | ICD-10-CM | POA: Diagnosis not present

## 2023-03-02 DIAGNOSIS — H9392 Unspecified disorder of left ear: Secondary | ICD-10-CM | POA: Diagnosis not present

## 2023-03-02 DIAGNOSIS — G8929 Other chronic pain: Secondary | ICD-10-CM | POA: Diagnosis not present

## 2023-03-04 DIAGNOSIS — N1831 Chronic kidney disease, stage 3a: Secondary | ICD-10-CM | POA: Diagnosis not present

## 2023-03-05 LAB — BASIC METABOLIC PANEL
BUN/Creatinine Ratio: 10 (ref 9–23)
BUN: 15 mg/dL (ref 6–24)
CO2: 24 mmol/L (ref 20–29)
Calcium: 10.4 mg/dL — ABNORMAL HIGH (ref 8.7–10.2)
Chloride: 103 mmol/L (ref 96–106)
Creatinine, Ser: 1.55 mg/dL — ABNORMAL HIGH (ref 0.57–1.00)
Glucose: 93 mg/dL (ref 70–99)
Potassium: 4 mmol/L (ref 3.5–5.2)
Sodium: 142 mmol/L (ref 134–144)
eGFR: 38 mL/min/{1.73_m2} — ABNORMAL LOW (ref >59)

## 2023-03-07 ENCOUNTER — Telehealth: Payer: Self-pay | Admitting: Physician Assistant

## 2023-03-07 NOTE — Telephone Encounter (Signed)
[facility] called to clarify an order for Metformin 1000mg  mg has it been changed from  500mg  er to 1000mg   Please Advise [phone]

## 2023-03-07 NOTE — Telephone Encounter (Signed)
Call patient and confirm which one she is taking?

## 2023-03-07 NOTE — Progress Notes (Signed)
Kidney function did not improve and stayed the same.

## 2023-03-08 ENCOUNTER — Ambulatory Visit: Payer: Medicare HMO

## 2023-03-09 ENCOUNTER — Telehealth: Payer: Self-pay | Admitting: Physician Assistant

## 2023-03-09 NOTE — Telephone Encounter (Signed)
Pharmacy called wanting clarification on the patient metformin. States that they have been filing 500mg  and also has received the 1000mg  from our office. Do they discontinue the 500mg ?

## 2023-03-11 ENCOUNTER — Telehealth: Payer: Self-pay | Admitting: Physician Assistant

## 2023-03-11 NOTE — Telephone Encounter (Signed)
[facility] called requesting clarification on the patients prescription metFORMIN (GLUCOPHAGE) 1000 MG tablet [[id number]] . They would like to know if this rx is replacing or in addition to previously prescribed metformin xr.  [phone]

## 2023-03-14 ENCOUNTER — Other Ambulatory Visit: Payer: Self-pay | Admitting: Neurology

## 2023-03-14 NOTE — Telephone Encounter (Signed)
1000mg  is replacing 500mg  dose.

## 2023-03-14 NOTE — Telephone Encounter (Signed)
Last seen on 01/20/23 Follow up scheduled on 08/08/23

## 2023-03-15 NOTE — Telephone Encounter (Signed)
Pharmacy informed.  She states this is a change from metformin XR to plain metformin - was this change intentional?

## 2023-03-15 NOTE — Telephone Encounter (Signed)
Attempted call to patient. Left a voice mail message requesting a return call.  

## 2023-03-18 NOTE — Telephone Encounter (Signed)
Metformin should not cause your BP to elevate at all. The only issue with higher dose is more diarrhea. You can cut the tablets in half if you have pill cutter.

## 2023-03-18 NOTE — Telephone Encounter (Signed)
Pt is not tolerating well on metFORMIN (GLUCOPHAGE) 1000 MG tablet, pt states  her blood pressure readings at home  are high and she concerned about remaining on the higher dose

## 2023-03-29 ENCOUNTER — Telehealth: Payer: Self-pay | Admitting: Physician Assistant

## 2023-03-29 NOTE — Telephone Encounter (Signed)
Can you address this? Do I need to make another order?

## 2023-03-29 NOTE — Telephone Encounter (Signed)
[facility] called they need a new request to send to another company they're out of network please advise   [phone]

## 2023-03-31 NOTE — Telephone Encounter (Signed)
CPAP orders,Sleep study reports, clinical notes, demographics, and copies of insurance cards have been faxed to [facility] ( [facility]) at [phone]. Office will contact patient to schedule CPAP setup.

## 2023-04-07 ENCOUNTER — Telehealth: Payer: Self-pay | Admitting: Physician Assistant

## 2023-04-07 NOTE — Telephone Encounter (Signed)
[facility] called about the CPAP rx. States that they do not handle her prescriptions, she has medicare gold

## 2023-04-08 NOTE — Telephone Encounter (Signed)
Contacted [facility] ( spoke w/ Santina Evans),  company accepts patients [employer].  CPAP Orders, Home sleep study resuls, clinical notes, demographics and copies of insurance cards have been faxed to [facility] at [phone].

## 2023-04-08 NOTE — Telephone Encounter (Signed)
Contacted [facility] ( spoke w/ [place]), Patients current insurance [name] is out of network. [facility] is unable to service account.

## 2023-04-12 ENCOUNTER — Telehealth: Payer: Self-pay

## 2023-04-12 DIAGNOSIS — E6609 Other obesity due to excess calories: Secondary | ICD-10-CM

## 2023-04-12 DIAGNOSIS — E1122 Type 2 diabetes mellitus with diabetic chronic kidney disease: Secondary | ICD-10-CM

## 2023-04-12 NOTE — Telephone Encounter (Signed)
The directions read, Inject 3 mLs into the muscle once a week. Is this correct?

## 2023-04-12 NOTE — Telephone Encounter (Signed)
Copied from CRM [id number]. Topic: Clinical - Medication Question >> Apr 12, 2023  2:23 PM Desma Mcgregor wrote: Reason for CRM: Nina Trujillo looking to clarify the route of administration for Semaglutide, 2 MG/DOSE, (OZEMPIC, 2 MG/DOSE,) 8 MG/3ML SOPN for this Pt. CB# [phone]

## 2023-04-18 ENCOUNTER — Other Ambulatory Visit: Payer: Self-pay | Admitting: Neurology

## 2023-04-18 ENCOUNTER — Other Ambulatory Visit: Payer: Self-pay | Admitting: Physician Assistant

## 2023-04-18 DIAGNOSIS — M5030 Other cervical disc degeneration, unspecified cervical region: Secondary | ICD-10-CM

## 2023-04-18 DIAGNOSIS — Z716 Tobacco abuse counseling: Secondary | ICD-10-CM

## 2023-04-18 DIAGNOSIS — E66811 Obesity, class 1: Secondary | ICD-10-CM

## 2023-04-18 NOTE — Telephone Encounter (Signed)
Rx refilled.

## 2023-04-19 ENCOUNTER — Telehealth: Payer: Self-pay

## 2023-04-19 NOTE — Telephone Encounter (Signed)
Copied from CRM [id number]. Topic: General - Other >> Apr 18, 2023 10:28 AM Dondra Prader A wrote: Reason for CRM:   Corrie Dandy from [facility] called stating the prescription for CPAP is missing pressure setting and humidifier. Her call back number is [phone]

## 2023-04-22 ENCOUNTER — Telehealth: Payer: Self-pay

## 2023-04-22 NOTE — Telephone Encounter (Signed)
Task completed. Rx refill sent to the pharmacy on 04/18/23.

## 2023-04-22 NOTE — Telephone Encounter (Signed)
Copied from CRM [id number]. Topic: Clinical - Medication Refill >> Apr 21, 2023  5:15 PM Cassiday T wrote: Most Recent Primary Care Visit:  Provider: Jomarie Longs  Department: [facility]  Visit Type: SAME DAY  Date: 02/22/2023  Medication: gabapentin (NEURONTIN) 300 MG capsule [[id number]]   buspar  Has the patient contacted their pharmacy? Yes (Agent: If no, request that the patient contact the pharmacy for the refill. If patient does not wish to contact the pharmacy document the reason why and proceed with request.) (Agent: If yes, when and what did the pharmacy advise?)  Is this the correct pharmacy for this prescription? Yes If no, delete pharmacy and type the correct one.  This is the patient's preferred pharmacy:   [facility] [place] [address] [place] [address] Phone: [phone] Fax: [phone]   Has the prescription been filled recently? No  Is the patient out of the medication? No  Has the patient been seen for an appointment in the last year OR does the patient have an upcoming appointment? Yes  Can we respond through MyChart? Yes  Agent: Please be advised that Rx refills may take up to 3 business days. We ask that you follow-up with your pharmacy.

## 2023-04-26 ENCOUNTER — Telehealth: Payer: Self-pay

## 2023-04-26 ENCOUNTER — Other Ambulatory Visit: Payer: Self-pay

## 2023-04-26 DIAGNOSIS — E1122 Type 2 diabetes mellitus with diabetic chronic kidney disease: Secondary | ICD-10-CM

## 2023-04-26 DIAGNOSIS — R112 Nausea with vomiting, unspecified: Secondary | ICD-10-CM

## 2023-04-26 DIAGNOSIS — E6609 Other obesity due to excess calories: Secondary | ICD-10-CM

## 2023-04-26 MED ORDER — OZEMPIC (2 MG/DOSE) 8 MG/3ML ~~LOC~~ SOPN: 3.0000 mL | PEN_INJECTOR | SUBCUTANEOUS | 0 refills | Status: DC

## 2023-04-26 NOTE — Telephone Encounter (Signed)
Directions corrected and resent with correct directions per Marylene Land.

## 2023-04-29 ENCOUNTER — Telehealth: Payer: Self-pay

## 2023-04-29 NOTE — Telephone Encounter (Signed)
Requesting rx rf of meloxicam  Med not showing in patient current medication list.

## 2023-04-29 NOTE — Telephone Encounter (Signed)
Copied from CRM [id number]. Topic: Clinical - Medication Refill >> Apr 29, 2023 11:11 AM Deaijah H wrote: Most Recent Primary Care Visit:  Provider: Jomarie Longs  Department: [facility]  Visit Type: SAME DAY  Date: 02/22/2023  Medication: Meloxicam 15mg  tablet  Has the patient contacted their pharmacy? Yes (Agent: If no, request that the patient contact the pharmacy for the refill. If patient does not wish to contact the pharmacy document the reason why and proceed with request.) (Agent: If yes, when and what did the pharmacy advise?)  Is this the correct pharmacy for this prescription? Yes If no, delete pharmacy and type the correct one.  This is the patient's preferred pharmacy:   [facility] [place] - [address] [place] [address] Phone: [phone] Fax: [phone]    Has the prescription been filled recently? No  Is the patient out of the medication? Yes  Has the patient been seen for an appointment in the last year OR does the patient have an upcoming appointment? Yes  Can we respond through MyChart? Yes  Agent: Please be advised that Rx refills may take up to 3 business days. We ask that you follow-up with your pharmacy.

## 2023-05-03 DIAGNOSIS — E785 Hyperlipidemia, unspecified: Secondary | ICD-10-CM | POA: Diagnosis not present

## 2023-05-03 DIAGNOSIS — I10 Essential (primary) hypertension: Secondary | ICD-10-CM | POA: Diagnosis not present

## 2023-05-03 DIAGNOSIS — I129 Hypertensive chronic kidney disease with stage 1 through stage 4 chronic kidney disease, or unspecified chronic kidney disease: Secondary | ICD-10-CM | POA: Diagnosis not present

## 2023-05-03 DIAGNOSIS — E1122 Type 2 diabetes mellitus with diabetic chronic kidney disease: Secondary | ICD-10-CM | POA: Diagnosis not present

## 2023-05-03 DIAGNOSIS — N183 Chronic kidney disease, stage 3 unspecified: Secondary | ICD-10-CM | POA: Diagnosis not present

## 2023-05-03 DIAGNOSIS — N1831 Chronic kidney disease, stage 3a: Secondary | ICD-10-CM | POA: Diagnosis not present

## 2023-05-03 DIAGNOSIS — Z7984 Long term (current) use of oral hypoglycemic drugs: Secondary | ICD-10-CM | POA: Diagnosis not present

## 2023-05-03 DIAGNOSIS — Z794 Long term (current) use of insulin: Secondary | ICD-10-CM | POA: Diagnosis not present

## 2023-05-03 NOTE — Addendum Note (Signed)
Addended by: Jomarie Longs on: 05/03/2023 04:48 PM   Modules accepted: Orders

## 2023-05-04 ENCOUNTER — Other Ambulatory Visit: Payer: Self-pay | Admitting: Physician Assistant

## 2023-05-04 LAB — URINALYSIS, ROUTINE W REFLEX MICROSCOPIC
Bilirubin: NEGATIVE
Blood, UA: NEGATIVE
Glucose: 300
Nitrites, Initial: NEGATIVE
Protein: 50
Specific Gravity: 1.029
Urobilinogen, UA: 2
WBC, UA: NEGATIVE
pH: 5.5

## 2023-05-04 LAB — PROTEIN / CREATININE RATIO, URINE
Albumin, U: 128
Creatinine, Urine: 358

## 2023-05-04 LAB — OTHER LAB RESULT
Anion gap: 6
BUN/Creatinine Ratio: 11.6
Phosphorus: 2.9

## 2023-05-04 LAB — MICROALBUMIN / CREATININE URINE RATIO: Microalb Creat Ratio: 36

## 2023-05-04 LAB — BASIC METABOLIC PANEL WITH GFR
BUN: 20 (ref 4–21)
CO2: 28 — AB (ref 13–22)
Chloride: 106 (ref 99–108)
Creatinine: 1.7 — AB (ref 0.5–1.1)
Glucose: 87
Potassium: 4.5 meq/L (ref 3.5–5.1)
Sodium: 140 (ref 137–147)

## 2023-05-04 LAB — COMPREHENSIVE METABOLIC PANEL WITH GFR
Albumin: 4.3 (ref 3.5–5.0)
Calcium: 9.9 (ref 8.7–10.7)
eGFR: 34

## 2023-05-04 MED ORDER — DOXYCYCLINE HYCLATE 100 MG PO TABS: 100.0000 mg | ORAL_TABLET | Freq: Two times a day (BID) | ORAL | 0 refills | Status: DC

## 2023-05-04 NOTE — Telephone Encounter (Signed)
Patient informed states nephrologist did some kidney testing and is wanting Jade to review these results. States having Right flank pain, fatgiue- states he increased BP med losartan to 75mg  and added amlodipine 5mg  to current medication regimen.  She states she also had  a cyst in axilla that has been coming and going x 2 weeks. States this is a frequent issue for her - requesting an abx for this - states next appt she has is in January - she would be happy to schedule sooner if anything available to see Lesly Rubenstein sooner.  Will print recent lab work from nephrology.

## 2023-05-04 NOTE — Telephone Encounter (Signed)
Patient informed. 

## 2023-05-06 NOTE — Telephone Encounter (Signed)
error 

## 2023-05-13 ENCOUNTER — Other Ambulatory Visit: Payer: Self-pay | Admitting: Sports Medicine

## 2023-05-19 ENCOUNTER — Other Ambulatory Visit: Payer: Self-pay | Admitting: Physician Assistant

## 2023-05-19 DIAGNOSIS — E6609 Other obesity due to excess calories: Secondary | ICD-10-CM

## 2023-05-19 DIAGNOSIS — E1122 Type 2 diabetes mellitus with diabetic chronic kidney disease: Secondary | ICD-10-CM

## 2023-05-26 ENCOUNTER — Other Ambulatory Visit: Payer: Self-pay | Admitting: Physician Assistant

## 2023-05-26 DIAGNOSIS — Z716 Tobacco abuse counseling: Secondary | ICD-10-CM

## 2023-05-26 DIAGNOSIS — M5030 Other cervical disc degeneration, unspecified cervical region: Secondary | ICD-10-CM

## 2023-05-26 DIAGNOSIS — E6609 Other obesity due to excess calories: Secondary | ICD-10-CM

## 2023-05-26 NOTE — Telephone Encounter (Signed)
 Copied from CRM [id number]. Topic: Clinical - Medication Refill >> May 26, 2023  3:34 PM Deleta HERO wrote: Most Recent Primary Care Visit:  Provider: ANTONIETTE VERMELL CROME  Department: [facility]  Visit Type: SAME DAY  Date: 02/22/2023  Medication: buPROPion  (WELLBUTRIN  SR) 200 MG 12 hr tablet & gabapentin  (NEURONTIN ) 300 MG capsule   Has the patient contacted their pharmacy? Yes, pharmacy is requesting refill for the pt. (Agent: If no, request that the patient contact the pharmacy for the refill. If patient does not wish to contact the pharmacy document the reason why and proceed with request.) (Agent: If yes, when and what did the pharmacy advise?)  Is this the correct pharmacy for this prescription? Yes If no, delete pharmacy and type the correct one.  This is the patient's preferred pharmacy:   [facility] - [place] [address] [place] [address] Phone: [phone] Fax: [phone]   Has the prescription been filled recently? No  Is the patient out of the medication? Yes  Has the patient been seen for an appointment in the last year OR does the patient have an upcoming appointment? Yes  Can we respond through MyChart? No  Agent: Please be advised that Rx refills may take up to 3 business days. We ask that you follow-up with your pharmacy.

## 2023-05-27 MED ORDER — BUPROPION HCL ER (SR) 200 MG PO TB12: ORAL_TABLET | ORAL | 11 refills | Status: DC

## 2023-05-27 MED ORDER — GABAPENTIN 300 MG PO CAPS: ORAL_CAPSULE | ORAL | 11 refills | Status: DC

## 2023-06-13 ENCOUNTER — Other Ambulatory Visit: Payer: Self-pay | Admitting: Physician Assistant

## 2023-06-13 DIAGNOSIS — N1831 Chronic kidney disease, stage 3a: Secondary | ICD-10-CM

## 2023-06-13 DIAGNOSIS — E6609 Other obesity due to excess calories: Secondary | ICD-10-CM

## 2023-06-15 ENCOUNTER — Ambulatory Visit: Payer: Self-pay | Admitting: Physician Assistant

## 2023-06-15 NOTE — Telephone Encounter (Signed)
Copied from CRM [id number]. Topic: Clinical - Pink Word Triage >> Jun 15, 2023  9:02 AM Mosetta Putt H wrote: Reason for Triage: Having pain in left leg it depends can be 8/9 pain level , hurts when have to hold urine  Chief Complaint: left leg pain Symptoms: pain, painful urination at times Frequency: comes and goes Pertinent Negatives: Patient denies sob, fever Disposition: [] ED /[] Urgent Care (no appt availability in office) / [x] Appointment(In office/virtual)/ []  [facility]/ [] Home Care/ [] Refused Recommended Disposition /[] [facility]/ []  Follow-up with PCP Additional Notes: Patient states problem started 2 weeks ago and has just gotten worse.  Hx MS, DM.  States she thought it could be her MS getting worse or could have a UTI.  Apt made for Friday with PCP.  Care advice given, denies questions, instructed to go to er if becomes worse.   Reason for Disposition  [1] MODERATE pain (e.g., interferes with normal activities, limping) AND [2] present > 3 days  Answer Assessment - Initial Assessment Questions 1. ONSET: "When did the pain start?"      Off and on for a minute, but worse the past 2 weeks 2. LOCATION: "Where is the pain located?"      Left leg  3. PAIN: "How bad is the pain?"    (Scale 1-10; or mild, moderate, severe)   -  MILD (1-3): doesn't interfere with normal activities    -  MODERATE (4-7): interferes with normal activities (e.g., work or school) or awakens from sleep, limping    -  SEVERE (8-10): excruciating pain, unable to do any normal activities, unable to walk    "When I have to urinate, it's an 8/10 in my leg" 4. WORK OR EXERCISE: "Has there been any recent work or exercise that involved this part of the body?"      denies 5. CAUSE: "What do you think is causing the leg pain?"     Maybe my MS or UTI 6. OTHER SYMPTOMS: "Do you have any other symptoms?" (e.g., chest pain, back pain, breathing difficulty, swelling, rash, fever, numbness, weakness)      Does have pain in left arm  Protocols used: Leg Pain-A-AH

## 2023-06-17 ENCOUNTER — Ambulatory Visit (INDEPENDENT_AMBULATORY_CARE_PROVIDER_SITE_OTHER): Payer: Medicare HMO | Admitting: Physician Assistant

## 2023-06-17 ENCOUNTER — Encounter: Payer: Self-pay | Admitting: Physician Assistant

## 2023-06-17 VITALS — BP 120/60 | HR 93 | Ht 66.0 in | Wt 201.0 lb

## 2023-06-17 DIAGNOSIS — N1831 Chronic kidney disease, stage 3a: Secondary | ICD-10-CM

## 2023-06-17 DIAGNOSIS — L659 Nonscarring hair loss, unspecified: Secondary | ICD-10-CM | POA: Diagnosis not present

## 2023-06-17 DIAGNOSIS — R29898 Other symptoms and signs involving the musculoskeletal system: Secondary | ICD-10-CM

## 2023-06-17 DIAGNOSIS — R2689 Other abnormalities of gait and mobility: Secondary | ICD-10-CM

## 2023-06-17 DIAGNOSIS — K5903 Drug induced constipation: Secondary | ICD-10-CM

## 2023-06-17 DIAGNOSIS — M5442 Lumbago with sciatica, left side: Secondary | ICD-10-CM | POA: Diagnosis not present

## 2023-06-17 DIAGNOSIS — E1122 Type 2 diabetes mellitus with diabetic chronic kidney disease: Secondary | ICD-10-CM

## 2023-06-17 DIAGNOSIS — G379 Demyelinating disease of central nervous system, unspecified: Secondary | ICD-10-CM

## 2023-06-17 DIAGNOSIS — Z7984 Long term (current) use of oral hypoglycemic drugs: Secondary | ICD-10-CM | POA: Diagnosis not present

## 2023-06-17 LAB — POCT UA - MICROALBUMIN
Creatinine, POC: 300 mg/dL
Microalbumin Ur, POC: 80 mg/L

## 2023-06-17 LAB — POCT GLYCOSYLATED HEMOGLOBIN (HGB A1C): Hemoglobin A1C: 5.4 % (ref 4.0–5.6)

## 2023-06-17 MED ORDER — AMBULATORY NON FORMULARY MEDICATION: 0 refills | Status: DC

## 2023-06-17 MED ORDER — PREDNISONE 50 MG PO TABS: ORAL_TABLET | ORAL | 0 refills | Status: DC

## 2023-06-17 MED ORDER — LINACLOTIDE 290 MCG PO CAPS: 290.0000 ug | ORAL_CAPSULE | Freq: Every day | ORAL | 3 refills | Status: AC

## 2023-06-17 NOTE — Patient Instructions (Signed)
Use cane as needed Prednisone burst Get xray of lumbar spine Will order formal PT Use muscle relaxer as needed Consider massage Sleep with pillow in btw legs on side

## 2023-06-17 NOTE — Progress Notes (Signed)
Protein up in urine a little. This just means we need to watch your kidney function and make sure to monitor any changes. You are still seeing nephrology, right?
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
## 2023-06-20 ENCOUNTER — Encounter: Payer: Self-pay | Admitting: Physician Assistant

## 2023-06-20 DIAGNOSIS — M5442 Lumbago with sciatica, left side: Secondary | ICD-10-CM | POA: Insufficient documentation

## 2023-06-20 DIAGNOSIS — L659 Nonscarring hair loss, unspecified: Secondary | ICD-10-CM | POA: Insufficient documentation

## 2023-07-06 ENCOUNTER — Ambulatory Visit: Payer: Medicare HMO | Admitting: Physical Therapy
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
## 2023-07-11 ENCOUNTER — Other Ambulatory Visit: Payer: Self-pay | Admitting: Physician Assistant

## 2023-07-11 DIAGNOSIS — M5442 Lumbago with sciatica, left side: Secondary | ICD-10-CM

## 2023-07-11 DIAGNOSIS — N1831 Chronic kidney disease, stage 3a: Secondary | ICD-10-CM

## 2023-07-11 DIAGNOSIS — E6609 Other obesity due to excess calories: Secondary | ICD-10-CM
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
## 2023-07-13 ENCOUNTER — Ambulatory Visit: Payer: Medicare HMO

## 2023-07-13 ENCOUNTER — Other Ambulatory Visit: Payer: Self-pay

## 2023-07-13 ENCOUNTER — Ambulatory Visit: Payer: Medicare HMO | Attending: Physician Assistant | Admitting: Physical Therapy

## 2023-07-13 ENCOUNTER — Encounter: Payer: Self-pay | Admitting: Physical Therapy

## 2023-07-13 DIAGNOSIS — M5442 Lumbago with sciatica, left side: Secondary | ICD-10-CM | POA: Insufficient documentation

## 2023-07-13 DIAGNOSIS — R2681 Unsteadiness on feet: Secondary | ICD-10-CM | POA: Insufficient documentation

## 2023-07-13 DIAGNOSIS — M5459 Other low back pain: Secondary | ICD-10-CM | POA: Insufficient documentation

## 2023-07-13 DIAGNOSIS — M47816 Spondylosis without myelopathy or radiculopathy, lumbar region: Secondary | ICD-10-CM | POA: Diagnosis not present

## 2023-07-13 DIAGNOSIS — R29898 Other symptoms and signs involving the musculoskeletal system: Secondary | ICD-10-CM | POA: Insufficient documentation

## 2023-07-13 DIAGNOSIS — M6281 Muscle weakness (generalized): Secondary | ICD-10-CM | POA: Insufficient documentation

## 2023-07-13 DIAGNOSIS — G379 Demyelinating disease of central nervous system, unspecified: Secondary | ICD-10-CM | POA: Insufficient documentation

## 2023-07-13 DIAGNOSIS — M545 Low back pain, unspecified: Secondary | ICD-10-CM | POA: Diagnosis not present

## 2023-07-13 DIAGNOSIS — R2689 Other abnormalities of gait and mobility: Secondary | ICD-10-CM | POA: Insufficient documentation

## 2023-07-13 DIAGNOSIS — M438X6 Other specified deforming dorsopathies, lumbar region: Secondary | ICD-10-CM | POA: Diagnosis not present

## 2023-07-13 NOTE — Telephone Encounter (Signed)
 Is rx appropriated to refill? Pls advise, thanks.

## 2023-07-18 ENCOUNTER — Ambulatory Visit (INDEPENDENT_AMBULATORY_CARE_PROVIDER_SITE_OTHER): Payer: Medicare HMO | Admitting: Sports Medicine

## 2023-07-18 DIAGNOSIS — M5416 Radiculopathy, lumbar region: Secondary | ICD-10-CM | POA: Insufficient documentation

## 2023-07-18 DIAGNOSIS — G5603 Carpal tunnel syndrome, bilateral upper limbs: Secondary | ICD-10-CM

## 2023-07-18 MED ORDER — PREDNISONE 10 MG (48) PO TBPK
ORAL_TABLET | Freq: Every day | ORAL | 0 refills | Status: DC
Start: 2023-07-18 — End: 2023-09-21

## 2023-07-18 NOTE — Patient Instructions (Signed)
 [name]

## 2023-07-18 NOTE — Progress Notes (Signed)
    Procedures performed today:    I did personally view lumbar spine x-rays, she has multilevel very mild lumbar DDD, there is multilevel facet arthropathy, no spondylolisthesis.  Independent interpretation of notes and tests performed by another provider:   None.  Brief History, Exam, Impression, and Recommendations:    Left lumbar radiculitis This is a very pleasant 58 year old female, she has had months of discomfort left low back with radiation down the back of the left lower leg to the middle toes of the foot. All consistent with a left L5 distribution radiculitis. She is currently doing physical therapy, she is only had a week or so. X-rays were done that did show multilevel mild DDD. I would like her to do another course of prednisone, her last course was 3 weeks ago, we will do a 12-day taper. Continue gabapentin. Return to see me in about 6 weeks, if persistent symptoms after 6 weeks of physical therapy we will consider MRI for epidural planning. No red flag symptoms, good strength distally.  Carpal tunnel syndrome, bilateral Needs at least 6 consistent weeks of carpal tunnel splinting, she does have the beanbag cock up splint. At the last evaluation in 2021 she had a positive Tinel's and Phalen signs. She will do this consistently for 6 weeks and we can do a median nerve hydrodissection if no better, symptoms are left worse than right currently.    ____________________________________________ Ihor Austin. Benjamin Stain, M.D., ABFM., CAQSM., AME. Primary Care and Sports Medicine [facility]  Adjunct Professor of Family Medicine  [employer]  Restaurant manager, fast food

## 2023-07-18 NOTE — Assessment & Plan Note (Signed)
 This is a very pleasant 58 year old female, she has had months of discomfort left low back with radiation down the back of the left lower leg to the middle toes of the foot. All consistent with a left L5 distribution radiculitis. She is currently doing physical therapy, she is only had a week or so. X-rays were done that did show multilevel mild DDD. I would like her to do another course of prednisone, her last course was 3 weeks ago, we will do a 12-day taper. Continue gabapentin. Return to see me in about 6 weeks, if persistent symptoms after 6 weeks of physical therapy we will consider MRI for epidural planning. No red flag symptoms, good strength distally.

## 2023-07-18 NOTE — Assessment & Plan Note (Signed)
 Needs at least 6 consistent weeks of carpal tunnel splinting, she does have the beanbag cock up splint. At the last evaluation in 2021 she had a positive Tinel's and Phalen signs. She will do this consistently for 6 weeks and we can do a median nerve hydrodissection if no better, symptoms are left worse than right currently.

## 2023-07-19 ENCOUNTER — Encounter: Payer: Self-pay | Admitting: Rehabilitative and Restorative Service Providers"

## 2023-07-19 ENCOUNTER — Ambulatory Visit: Payer: Medicare HMO | Attending: Physician Assistant | Admitting: Rehabilitative and Restorative Service Providers"

## 2023-07-19 DIAGNOSIS — M5459 Other low back pain: Secondary | ICD-10-CM | POA: Diagnosis not present

## 2023-07-19 DIAGNOSIS — R2681 Unsteadiness on feet: Secondary | ICD-10-CM | POA: Diagnosis not present

## 2023-07-19 DIAGNOSIS — M6281 Muscle weakness (generalized): Secondary | ICD-10-CM | POA: Diagnosis not present
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
## 2023-07-21 ENCOUNTER — Ambulatory Visit: Payer: Medicare HMO | Admitting: Rehabilitative and Restorative Service Providers"

## 2023-07-22 ENCOUNTER — Encounter: Payer: Self-pay | Admitting: Physician Assistant

## 2023-07-22 DIAGNOSIS — N1831 Chronic kidney disease, stage 3a: Secondary | ICD-10-CM

## 2023-07-22 MED ORDER — TOUJEO MAX SOLOSTAR 300 UNIT/ML ~~LOC~~ SOPN: 13.0000 [IU] | PEN_INJECTOR | Freq: Every day | SUBCUTANEOUS | 1 refills | Status: DC

## 2023-07-25 ENCOUNTER — Other Ambulatory Visit: Payer: Self-pay | Admitting: Physician Assistant

## 2023-07-25 DIAGNOSIS — M5412 Radiculopathy, cervical region: Secondary | ICD-10-CM

## 2023-07-25 DIAGNOSIS — M25552 Pain in left hip: Secondary | ICD-10-CM

## 2023-07-25 DIAGNOSIS — M25512 Pain in left shoulder: Secondary | ICD-10-CM

## 2023-07-25 DIAGNOSIS — E66811 Obesity, class 1: Secondary | ICD-10-CM

## 2023-07-25 DIAGNOSIS — R251 Tremor, unspecified: Secondary | ICD-10-CM

## 2023-07-25 DIAGNOSIS — M5030 Other cervical disc degeneration, unspecified cervical region: Secondary | ICD-10-CM

## 2023-07-25 DIAGNOSIS — N1831 Chronic kidney disease, stage 3a: Secondary | ICD-10-CM

## 2023-07-25 NOTE — Telephone Encounter (Signed)
 Copied from CRM [id number]. Topic: Clinical - Medication Refill >> Jul 25, 2023 12:12 PM Geroge Baseman wrote: Most Recent Primary Care Visit:  Provider: Monica Becton  Department: [facility]  Visit Type: OFFICE VISIT  Date: 07/18/2023  Medication: OZEMPIC, 2 MG/DOSE, 8 MG/3ML SOPN, baclofen (LIORESAL) 10 MG tablet   Has the patient contacted their pharmacy? No (Agent: If no, request that the patient contact the pharmacy for the refill. If patient does not wish to contact the pharmacy document the reason why and proceed with request.) (Agent: If yes, when and what did the pharmacy advise?)  Is this the correct pharmacy for this prescription?  If no, delete pharmacy and type the correct one.  This is the patient's preferred pharmacy:    [facility] [place] [address] [place] [address] Phone: [phone] Fax: [phone]   Has the prescription been filled recently? No  Is the patient out of the medication? No  Has the patient been seen for an appointment in the last year OR does the patient have an upcoming appointment? Yes  Can we respond through MyChart? No  Agent: Please be advised that Rx refills may take up to 3 business days. We ask that you follow-up with your pharmacy.

## 2023-07-25 NOTE — Telephone Encounter (Signed)
 Copied from CRM [id number]. Topic: Clinical - Medication Refill >> Jul 25, 2023 12:12 PM Geroge Baseman wrote: Most Recent Primary Care Visit:  Provider: Monica Becton  Department: [facility]  Visit Type: OFFICE VISIT  Date: 07/18/2023  Medication: OZEMPIC, 2 MG/DOSE, 8 MG/3ML SOPN, baclofen (LIORESAL) 10 MG tablet   Has the patient contacted their pharmacy? No (Agent: If no, request that the patient contact the pharmacy for the refill. If patient does not wish to contact the pharmacy document the reason why and proceed with request.) (Agent: If yes, when and what did the pharmacy advise?)  Is this the correct pharmacy for this prescription? Yes If no, delete pharmacy and type the correct one.  This is the patient's preferred pharmacy:    [facility] [place] [address] [place] [address] Phone: [phone] Fax: [phone]   Has the prescription been filled recently? No  Is the patient out of the medication? No  Has the patient been seen for an appointment in the last year OR does the patient have an upcoming appointment? Yes  Can we respond through MyChart? No  Agent: Please be advised that Rx refills may take up to 3 business days. We ask that you follow-up with your pharmacy.

## 2023-07-26 ENCOUNTER — Ambulatory Visit: Payer: Medicare HMO | Admitting: Rehabilitative and Restorative Service Providers"

## 2023-07-27 NOTE — Telephone Encounter (Signed)
 Attempted to contact the patient. No answer. Left a detailed vm msg for patient regarding the med refill and provider's recommendation. Direct call back provided to the patient.

## 2023-07-28 ENCOUNTER — Ambulatory Visit: Payer: Medicare HMO | Admitting: Rehabilitative and Restorative Service Providers"

## 2023-08-01 ENCOUNTER — Encounter: Payer: Self-pay | Admitting: Physician Assistant

## 2023-08-01 DIAGNOSIS — M51369 Other intervertebral disc degeneration, lumbar region without mention of lumbar back pain or lower extremity pain: Secondary | ICD-10-CM | POA: Insufficient documentation

## 2023-08-01 NOTE — Progress Notes (Signed)
 Lumbar aging and degenerative changes seen but no acute findings.

## 2023-08-02 ENCOUNTER — Ambulatory Visit: Payer: Medicare HMO | Admitting: Rehabilitative and Restorative Service Providers"

## 2023-08-02 ENCOUNTER — Encounter: Payer: Self-pay | Admitting: Rehabilitative and Restorative Service Providers"

## 2023-08-02 DIAGNOSIS — M5459 Other low back pain: Secondary | ICD-10-CM

## 2023-08-02 DIAGNOSIS — R2681 Unsteadiness on feet: Secondary | ICD-10-CM

## 2023-08-02 DIAGNOSIS — M6281 Muscle weakness (generalized): Secondary | ICD-10-CM
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
## 2023-08-04 ENCOUNTER — Encounter: Payer: Self-pay | Admitting: Rehabilitative and Restorative Service Providers"

## 2023-08-04 ENCOUNTER — Ambulatory Visit: Payer: Medicare HMO | Admitting: Rehabilitative and Restorative Service Providers"

## 2023-08-04 DIAGNOSIS — R2681 Unsteadiness on feet: Secondary | ICD-10-CM | POA: Diagnosis not present

## 2023-08-04 DIAGNOSIS — M6281 Muscle weakness (generalized): Secondary | ICD-10-CM | POA: Diagnosis not present

## 2023-08-04 DIAGNOSIS — M5459 Other low back pain: Secondary | ICD-10-CM
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
## 2023-08-08 ENCOUNTER — Ambulatory Visit (INDEPENDENT_AMBULATORY_CARE_PROVIDER_SITE_OTHER): Payer: Medicare HMO | Admitting: Neurology

## 2023-08-08 ENCOUNTER — Other Ambulatory Visit: Payer: Self-pay | Admitting: Physician Assistant

## 2023-08-08 ENCOUNTER — Encounter: Payer: Self-pay | Admitting: Neurology

## 2023-08-08 VITALS — BP 122/74 | HR 65 | Ht 67.0 in | Wt 209.5 lb

## 2023-08-08 DIAGNOSIS — R9082 White matter disease, unspecified: Secondary | ICD-10-CM | POA: Diagnosis not present

## 2023-08-08 DIAGNOSIS — Z79899 Other long term (current) drug therapy: Secondary | ICD-10-CM | POA: Diagnosis not present

## 2023-08-08 DIAGNOSIS — R208 Other disturbances of skin sensation: Secondary | ICD-10-CM | POA: Diagnosis not present

## 2023-08-08 DIAGNOSIS — E559 Vitamin D deficiency, unspecified: Secondary | ICD-10-CM | POA: Diagnosis not present

## 2023-08-08 DIAGNOSIS — R200 Anesthesia of skin: Secondary | ICD-10-CM

## 2023-08-08 DIAGNOSIS — E66811 Obesity, class 1: Secondary | ICD-10-CM

## 2023-08-08 DIAGNOSIS — R937 Abnormal findings on diagnostic imaging of other parts of musculoskeletal system: Secondary | ICD-10-CM | POA: Diagnosis not present

## 2023-08-08 DIAGNOSIS — E1122 Type 2 diabetes mellitus with diabetic chronic kidney disease: Secondary | ICD-10-CM

## 2023-08-08 NOTE — Progress Notes (Signed)
 [facility]  PATIENT: Nina Trujillo DOB: [birth year]  REFERRING DOCTOR OR PCP: Rodney Langton, MD SOURCE: Patient, notes from primary care, imaging and lab reports, MRI images personally reviewed.  _________________________________   HISTORICAL  CHIEF COMPLAINT:  Chief Complaint  Patient presents with   Follow-up    Pt in room 11. Alone. Here for 6 month follow up. Pt reports left side tingling/numbness using heating pad, left is worse, but happens on right side as well. Pt on prenidsone by Dr.Thekkekandam which helps with symptoms. Pt reports brain fog at times.      HISTORY OF PRESENT ILLNESS:  Nina Trujillo is a 58 year old woman with episodes of diplopia and headaches over the past couple months  UPDATE 08/08/2023 She is having more numbness and tingling in the left arm and leg.  This started in Early March 2025.      She received prednisone and felt it helped some.   Symptoms, however, are worse again as the taper is about to end.      She had a couple episodes while driving when she feels she blanked out cognitively and missed exits.    She notes intermittent headaches, mostly on the left.    She denies nausea but has photophobia.    When HA present, movement worsens it.   These occur 2 times a week.on average  She has occasional episodes with diplopia, usually while driving ,reading or watching TV.   She has never had an episode while just comfortably resting.  She has neck pain and pain will go into the arms, left greater than right.  MRI has shown degenerative changes in the mid cervical spine and she appears to have a focus of myelomalacia C5-C6 where she has moderate spinal stenosis.  There is severe left foraminal narrowing at this level.  Milder degenerative changes at C4-C5 with mild spinal stenosis.  She has seen neurosurgery.  She has less dysesthesia on the combination of gabapentin/carbamazepine.     MRI show multiple T2/FLAIR  hyperintense foci in the cerebral hemispheres and confluent foci in the pons.  Many of them have a configuration most consistent with chronic microvascular ischemic changes.  I agree that there has been some progression over time comparing between 2021 and 2023.  Most of her foci have an appearance most consistent with chronic microvascular ischemic change as a foci slowly become more apparent/enlarged.  It is definitely possible that some foci could also represent demyelination.  She has never had an enhancing lesion in the brain.  MRI in 23 showed a focus in the splenium that was not present on the previous MRI.  CSF did not show any oligoclonal bands.  I have discussed with her that I believe the likelihood she has MS is about 50% and would not recommend treatment unless we become more certain of that diagnosis.  She did have a second opinion at [facility] though they did not have the MRIs at the time and actual second opinion was not provided.  ESR was 2, ANA was 1:40 but all components were negative.   TSH was normal 11/2021  Father, sister, half-sister and paternal uncle and a cousin have MS.  Vascular risk factors:   Type 2 IDDM, HTN, h/o tobacco (started 1986 - quit 2023,  mostly 1/2 ppd)  History of numbness and abnormal MRI: She had numbness in her left arm into her hand and fingers in 2020.  She has had some pain of the left arm  off and on for a while but she started to experience numbness about 1 month ago that gradually worsened.   She also has achiness in her neck and upper back.  She has had neck pain off and on for several years but it is worse this year.    Imaging: MRI cervical spine 07/03/2022 showed   1. Hyperintense T2-weighted signal in the spinal cord at the C5-6 level, increased since 10/22/2020, likely myelomalacia.  --- (By my review this is unchanged to previous MRIs) 2. Unchanged moderate C5-6 spinal canal stenosis with moderate right and severe left neural foraminal  stenosis. 3. Unchanged mild C4-5 spinal canal stenosis with moderate right and mild left neural foraminal stenosis.  MRI of the cervical spine 11/2019 showed a single focus at C5-C6 just below the spinal stenosis at C5-C6.  It enhanced and could have been consistent with demyelination.  T  MRI of the brain 12/03/2019 showed multiple T2/FLAIR hyperintense foci that were more consistent with chronic microvascular ischemic changes than demyelination.    MRI of the cervical spine 10/22/2020 showed that the focus adjacent to C6 looked a little bit better but still had some enhancement.  This would be very unusual for a demyelinating plaque and would be more consistent with myelopathic changes  MRI of the brain 02/08/2022 shows T2/FLAIR hyperintense foci in the hemispheres and pons most consistent with chronic microvascular ischemic change.  However, 1 focus in the right splenium was not present on the previous MRI and has an appearance that could be more worrisome for demyelination.   Vascular risk factors:   Type 2 IDDM, HTN, 1/4 ppd smoker now (1/2 to 1 ppd x many years though).      We discussed smoking cessation.          LABS 01/2022:  ANA borderline 1:40 (speckled, cytoplasmic), dsDNA, ENA SM, RNP, Chromatin Abs, SSA, SSB, Scl70, Jo-1 , centromere Abswere all negative CSF 12/26/2021 showed 5 bands matched in serum and CSF (not the typical MS pattern).   IgG Index was normal.  Vasculitis labs were normal.     REVIEW OF SYSTEMS: Constitutional: No fevers, chills, sweats, or change in appetite Eyes: No visual changes, double vision, eye pain Ear, nose and throat: No hearing loss, ear pain, nasal congestion, sore throat Cardiovascular: No chest pain, palpitations Respiratory:  No shortness of breath at rest or with exertion.   No wheezes GastrointestinaI: No nausea, vomiting, diarrhea, abdominal pain, fecal incontinence Genitourinary:  No dysuria, urinary retention or frequency.  No  nocturia. Musculoskeletal:  No neck pain, back pain Integumentary: No rash, pruritus, skin lesions Neurological: as above Psychiatric: No depression at this time.  No anxiety Endocrine: No palpitations, diaphoresis, change in appetite, change in weigh or increased thirst Hematologic/Lymphatic:  No anemia, purpura, petechiae. Allergic/Immunologic: No itchy/runny eyes, nasal congestion, recent allergic reactions, rashes  ALLERGIES: Allergies  Allergen Reactions   Penicillins Anaphylaxis   Xigduo Xr [Dapagliflozin Pro-Metformin Er] Other (See Comments)    Yeast infection.     HOME MEDICATIONS:  Current Outpatient Medications:    AMBULATORY NON FORMULARY MEDICATION, Glucometer device and lancets and test strips.  Dx. Diabetes 250.0 controlled   Test 1 to 3 times a day., Disp: 1 Device, Rfl: 0   AMBULATORY NON FORMULARY MEDICATION, 4 pronged cane to be used for ambulation, Disp: 1 Device, Rfl: 0   atorvastatin (LIPITOR) 20 MG tablet, TAKE ONE TABLET (20 MG TOTAL) BY MOUTH DAILY AT 5PM, Disp: 90 tablet, Rfl: 11  Biotin w/ Vitamins C & E (HAIR/SKIN/NAILS PO), Take 1 capsule by mouth at bedtime., Disp: , Rfl:    buPROPion (WELLBUTRIN SR) 200 MG 12 hr tablet, TAKE ONE TABLET (200 MG TOTAL) BY MOUTH TWICE DAILY @ 9AM & 9PM, Disp: 180 tablet, Rfl: 11   carbamazepine (TEGRETOL) 200 MG tablet, Take 1 tablet (200 mg total) by mouth at bedtime., Disp: 30 tablet, Rfl: 5   Continuous Glucose Sensor (DEXCOM G7 SENSOR) MISC, 1 Device by Does not apply route daily. Change every 10 days, Disp: 3 each, Rfl: 5   Continuous Glucose Transmitter (DEXCOM G6 TRANSMITTER) MISC, Use for continuous glucose, Disp: 1 each, Rfl: PRN   cyclobenzaprine (FLEXERIL) 10 MG tablet, TAKE ONE TABLET (10MG  TOTAL) BY MOUTH THREE TIMES DAILY AS NEEDED FOR MUSCLE SPASMS, Disp: 90 tablet, Rfl: 11   escitalopram (LEXAPRO) 20 MG tablet, TAKE ONE TABLET (20 MG TOTAL) BY MOUTH DAILY AT 9AM NEEDS APPOINTMENT, Disp: 90 tablet, Rfl: 11    estradiol (ESTRACE) 2 MG tablet, TAKE ONE TABLET BY MOUTH DAILY, Disp: 90 tablet, Rfl: 11   FOLBEE 2.5-25-1 MG TABS tablet, TAKE ONE TABLET BY MOUTH DAILY AT 9AM, Disp: 90 tablet, Rfl: 2   gabapentin (NEURONTIN) 300 MG capsule, TAKE ONE CAPSULE BY MOUTH THREE TIMES DAILY @9AM -1PM-5PM, Disp: 270 capsule, Rfl: 11   hydrochlorothiazide (HYDRODIURIL) 25 MG tablet, TAKE ONE TABLET (25 mg total) BY MOUTH DAILY AT 9PM AT BEDTIME, Disp: 90 tablet, Rfl: 11   insulin glargine, 2 Unit Dial, (TOUJEO MAX SOLOSTAR) 300 UNIT/ML Solostar Pen, Inject 13 Units into the skin at bedtime., Disp: 3 mL, Rfl: 1   linaclotide (LINZESS) 290 MCG CAPS capsule, Take 1 capsule (290 mcg total) by mouth daily. For constipation., Disp: 90 capsule, Rfl: 3   losartan (COZAAR) 25 MG tablet, Take 1 tablet (25 mg total) by mouth daily., Disp: 30 tablet, Rfl: 1   metFORMIN (GLUCOPHAGE) 1000 MG tablet, Take 1 tablet by mouth 2 times daily with a meal., Disp: 180 tablet, Rfl: 1   modafinil (PROVIGIL) 200 MG tablet, Take 1 tablet (200 mg total) by mouth daily., Disp: 30 tablet, Rfl: 5   omeprazole (PRILOSEC) 40 MG capsule, TAKE ONE CAPSULE (40 mg total) BY MOUTH DAILY AT 9AM, Disp: 90 capsule, Rfl: 11   ondansetron (ZOFRAN-ODT) 8 MG disintegrating tablet, Take 1 tablet (8 mg total) by mouth daily., Disp: 20 tablet, Rfl: 11   predniSONE (STERAPRED UNI-PAK 48 TAB) 10 MG (48) TBPK tablet, Take by mouth daily. 12-day taper pack, use as directed for taper, Disp: 1 tablet, Rfl: 0   triamcinolone cream (KENALOG) 0.1 %, Apply 1 application topically 2 (two) times daily. (Patient taking differently: Apply 1 application  topically 2 (two) times daily as needed (skin irritation.).), Disp: 80 g, Rfl: 0   doxycycline (VIBRA-TABS) 100 MG tablet, Take 1 tablet (100 mg total) by mouth 2 (two) times daily., Disp: 20 tablet, Rfl: 0   Insulin Pen Needle (PEN NEEDLES) 31G X 6 MM MISC, Injected insulin into skin once daily., Disp: 100 each, Rfl: prn   OZEMPIC, 2  MG/DOSE, 8 MG/3ML SOPN, INJECT 2MG  SUBCUTANEOUSLY ONCE A WEEK, Disp: 3 mL, Rfl: 0  PAST MEDICAL HISTORY: Past Medical History:  Diagnosis Date   Chronic kidney disease    stage 3    Depression    Diabetes mellitus without complication (HCC)    Hypertension     PAST SURGICAL HISTORY: Past Surgical History:  Procedure Laterality Date   ABDOMINAL HYSTERECTOMY  both uterus and ovaries removed.    COLONOSCOPY  10 + years ago    IN [facility]-normal exam per pt    FAMILY HISTORY: Family History  Problem Relation Age of Onset   Cancer Mother        cervical, breast   Multiple sclerosis Mother    Multiple sclerosis Father    Heart attack Brother    Cancer Maternal Aunt        lung   Diabetes Maternal Aunt    Hypertension Maternal Aunt    Stroke Maternal Grandmother    Multiple sclerosis Sister    Diabetes Sister    Diabetes Maternal Aunt    Hypertension Maternal Aunt    Multiple sclerosis Paternal Uncle    Multiple sclerosis Half-Sister    Colon cancer Neg Hx    Esophageal cancer Neg Hx    Rectal cancer Neg Hx    Stomach cancer Neg Hx    Colon polyps Neg Hx     SOCIAL HISTORY: Social History   Socioeconomic History   Marital status: Single    Spouse name: Not on file   Number of children: 2   Years of education: 10   Highest education level: Not on file  Occupational History   Not on file  Tobacco Use   Smoking status: Every Day    Current packs/day: 0.50    Types: Cigarettes   Smokeless tobacco: Never  Vaping Use   Vaping status: Never Used  Substance and Sexual Activity   Alcohol use: No    Alcohol/week: 0.0 standard drinks of alcohol   Drug use: No   Sexual activity: Yes  Other Topics Concern   Not on file  Social History Narrative   Lives with daughter    Caffeine use: coffee daily   Right handed    Social Drivers of Corporate investment banker Strain: Not on file  Food Insecurity: Low Risk  (05/03/2023)   Received from [facility]    Hunger Vital Sign    Worried About Running Out of Food in the Last Year: Never true    Ran Out of Food in the Last Year: Never true  Transportation Needs: No Transportation Needs (05/03/2023)   Received from [facility]    In the past 12 months, has lack of reliable transportation kept you from medical appointments, meetings, work or from getting things needed for daily living? : No  Physical Activity: Not on file  Stress: Not on file  Social Connections: Not on file  Intimate Partner Violence: Not on file       PHYSICAL EXAM  Vitals:   08/08/23 1059  BP: 122/74  Pulse: 65  Weight: 209 lb 8 oz (95 kg)  Height: 5\' 7"  (1.702 m)    Body mass index is 32.81 kg/m.   General: The patient is well-developed and well-nourished and in no acute distress   HEENT:  Head is [place]/AT.  Sclera are anicteric.     Neck: .   She has mild neck tenderness.  The neck has a mildly reduced range of motion.   Skin: Extremities are without rash or  edema.   Neurologic Exam   Mental status: The patient is alert and oriented x 3 at the time of the examination. The patient has apparent normal recent and remote memory, with an apparently normal attention span and concentration ability.   Speech is normal.   Cranial nerves: Extraocular movements are full. Facial strnegth  is normal.    Motor:  Muscle bulk is normal.   Tone is normal. Strength is  5 / 5 in all 4 extremities except 4++ right triceps and 4++ left triceps.   Hands normal strength.    Sensory: She has normal sensation to touch in hands but reports mildly reduced vibration sensation in the left leg relative to the right.  Ankle sensation was equal to me.   Coordination: Cerebellar testing reveals good finger-nose-finger and heel-to-shin bilaterally.   Gait and station: Station is normal.   Gait is arthritic and tandem gait is wide..  No foot drop.  Romberg is negative.    Reflexes: Deep tendon reflexes are symmetric and  normal bilaterally.   No ankle clonus.    DIAGNOSTIC DATA (LABS, IMAGING, TESTING) - I reviewed patient records, labs, notes, testing and imaging myself where available.  Lab Results  Component Value Date   WBC 8.5 01/11/2023   HGB 14.4 01/11/2023   HCT 43.0 01/11/2023   MCV 90 01/11/2023   PLT 272 01/11/2023      Component Value Date/Time   NA 142 03/04/2023 1651   K 4.0 03/04/2023 1651   CL 103 03/04/2023 1651   CO2 24 03/04/2023 1651   GLUCOSE 93 03/04/2023 1651   GLUCOSE 108 (H) 06/22/2022 1135   BUN 15 03/04/2023 1651   CREATININE 1.55 (H) 03/04/2023 1651   CREATININE 1.15 (H) 06/22/2022 1135   CALCIUM 10.4 (H) 03/04/2023 1651   CALCIUM 10.1 06/13/2013 0000   PROT 7.3 01/11/2023 1145   ALBUMIN 4.5 01/11/2023 1145   ALBUMIN 4.3 12/20/2019 1058   AST 24 01/11/2023 1145   ALT 19 01/11/2023 1145   ALKPHOS 102 01/11/2023 1145   BILITOT 0.3 01/11/2023 1145   GFRNONAA 43 (L) 10/09/2020 1056   GFRAA 50 (L) 10/09/2020 1056   Lab Results  Component Value Date   CHOL 122 03/25/2021   HDL 45 (L) 03/25/2021   LDLCALC 59 03/25/2021   TRIG 97 03/25/2021   CHOLHDL 2.7 03/25/2021   Lab Results  Component Value Date   HGBA1C 5.4 06/17/2023   No results found for: "VITAMINB12" Lab Results  Component Value Date   TSH 1.310 01/11/2023       ASSESSMENT AND PLAN  Numbness - Plan: MR BRAIN W WO CONTRAST, MR CERVICAL SPINE W WO CONTRAST  Abnormal MRI, cervical spine - Plan: MR BRAIN W WO CONTRAST, MR CERVICAL SPINE W WO CONTRAST  White matter abnormality on MRI of brain - Plan: MR BRAIN W WO CONTRAST, MR CERVICAL SPINE W WO CONTRAST  Vitamin D deficiency - Plan: VITAMIN D 25 Hydroxy (Vit-D Deficiency, Fractures)  Dysesthesia - Plan: Carbamazepine level, total, Comprehensive metabolic panel  High risk medication use - Plan: Carbamazepine level, total, Comprehensive metabolic panel   She has newer left sided numbness.  I am still uncertain is she has MS vs chronic  microvascular ischemic change plus spine DJD related issues.   The MRI of the brain shows multiple T2/FLAIR hyperintense foci but the vast majority have an appearance classic for chronic microvascular ischemic change rather than demyelination.  There was only 1 new lesion between 2021 and 2023 and it was periventricular in the splenium on the right.  The MRI of the cervical spine shows spinal stenosis at a couple levels and a T2 hyperintense focus posterolaterally to the left.  That position in the spinal cord could potentially be seen with demyelination like MS.  However, the focus enhances on 2  MRIs separated by a year.  MS lesions should only enhance for 4 to 8 weeks..  Lesions associated with compressive myelopathy can potentially enhance for long period of time.  On the other hand, myelopathy foci are typically off-center in the gray matter rather than posterolateral.  Further muddying the waters, her CSF showed 5 oligoclonal bands that were also present in the serum which is a nonspecific pattern rather than the MS specific pattern.  Due to the new symptoms we will check an MRI of the brain and cervical spine compared to the 2023 scans. I feel the likelihood that this represents MS is only 50% and therefore I am holding off on treatment.  Check CMP, carbamazepine level and vitamin D Continue gabapentin and carbamazepine 2 help with the dysesthesias.  5.   Return in 6 months or sooner for new or worsening neurologic symptoms.  40-minute office visit with the majority of the time spent face-to-face for history and physical, discussion/counseling and decision-making.  Additional time with record review and documentation.  This visit is part of a comprehensive longitudinal care medical relationship regarding the patients primary diagnosis of spinal stenosis, abnormal brain MRI, diplopia and related concerns.   Caven Perine A. Epimenio Foot, MD, [facility] 08/08/2023, 5:18 PM Certified in Neurology, Clinical  Neurophysiology, Sleep Medicine and Neuroimaging  [facility] [address], [address] [place], [place] [address] [phone]

## 2023-08-09 ENCOUNTER — Ambulatory Visit: Payer: Medicare HMO | Admitting: Rehabilitative and Restorative Service Providers"

## 2023-08-09 ENCOUNTER — Telehealth: Payer: Self-pay | Admitting: *Deleted

## 2023-08-09 DIAGNOSIS — E785 Hyperlipidemia, unspecified: Secondary | ICD-10-CM | POA: Diagnosis not present

## 2023-08-09 DIAGNOSIS — I10 Essential (primary) hypertension: Secondary | ICD-10-CM | POA: Diagnosis not present

## 2023-08-09 DIAGNOSIS — F17200 Nicotine dependence, unspecified, uncomplicated: Secondary | ICD-10-CM | POA: Diagnosis not present

## 2023-08-09 DIAGNOSIS — N183 Chronic kidney disease, stage 3 unspecified: Secondary | ICD-10-CM | POA: Diagnosis not present

## 2023-08-09 DIAGNOSIS — I129 Hypertensive chronic kidney disease with stage 1 through stage 4 chronic kidney disease, or unspecified chronic kidney disease: Secondary | ICD-10-CM | POA: Diagnosis not present

## 2023-08-09 DIAGNOSIS — E1122 Type 2 diabetes mellitus with diabetic chronic kidney disease: Secondary | ICD-10-CM | POA: Diagnosis not present

## 2023-08-09 DIAGNOSIS — E1169 Type 2 diabetes mellitus with other specified complication: Secondary | ICD-10-CM | POA: Diagnosis not present

## 2023-08-09 DIAGNOSIS — Z7984 Long term (current) use of oral hypoglycemic drugs: Secondary | ICD-10-CM | POA: Diagnosis not present

## 2023-08-09 DIAGNOSIS — E669 Obesity, unspecified: Secondary | ICD-10-CM | POA: Diagnosis not present

## 2023-08-09 DIAGNOSIS — Z794 Long term (current) use of insulin: Secondary | ICD-10-CM | POA: Diagnosis not present

## 2023-08-09 LAB — COMPREHENSIVE METABOLIC PANEL
ALT: 23 IU/L (ref 0–32)
AST: 23 IU/L (ref 0–40)
Albumin: 4.5 g/dL (ref 3.8–4.9)
Alkaline Phosphatase: 102 IU/L (ref 44–121)
BUN/Creatinine Ratio: 11 (ref 9–23)
BUN: 15 mg/dL (ref 6–24)
Bilirubin Total: 0.3 mg/dL (ref 0.0–1.2)
CO2: 23 mmol/L (ref 20–29)
Calcium: 10.1 mg/dL (ref 8.7–10.2)
Chloride: 103 mmol/L (ref 96–106)
Creatinine, Ser: 1.36 mg/dL — ABNORMAL HIGH (ref 0.57–1.00)
Globulin, Total: 2.7 g/dL (ref 1.5–4.5)
Glucose: 89 mg/dL (ref 70–99)
Potassium: 4.6 mmol/L (ref 3.5–5.2)
Sodium: 141 mmol/L (ref 134–144)
Total Protein: 7.2 g/dL (ref 6.0–8.5)
eGFR: 45 mL/min/{1.73_m2} — ABNORMAL LOW (ref >59)

## 2023-08-09 LAB — CARBAMAZEPINE LEVEL, TOTAL: Carbamazepine (Tegretol), S: <2 ug/mL — ABNORMAL LOW (ref 4.0–12.0)

## 2023-08-09 LAB — VITAMIN D 25 HYDROXY (VIT D DEFICIENCY, FRACTURES): Vit D, 25-Hydroxy: 26.5 ng/mL — ABNORMAL LOW (ref 30.0–100.0)

## 2023-08-09 MED ORDER — CARBAMAZEPINE 200 MG PO TABS: 200.0000 mg | ORAL_TABLET | Freq: Two times a day (BID) | ORAL | 5 refills | Status: AC

## 2023-08-09 NOTE — Telephone Encounter (Signed)
-----   Message from Asa Lente sent at 08/09/2023 12:51 PM EDT ----- Your labs show that her kidney function was similar to last time.  The carbamazepine level was not detectable.  If she still taking this medication?  If so is not missing a lot of doses?  If she is taking the medication regularly, we can add 1 more pill a day.  Vitamin D was a little bit low and I would like her to take 1000 units over-the-counter daily

## 2023-08-09 NOTE — Telephone Encounter (Signed)
 Called pt. Relayed results per MD note. Pt reports she missed dose of carbamazepine this past Sunday. Otherwise takes 200mg  po at bedtime. I placed on hold and spoke w/ MD. He recommends increasing dose to 200mg  po BID. I relayed to pt and e-scribed updated rx to [facility] per pt request.   She asked about scheduling MRI's ordered yesterday. Aware they need approval via insurance first and then she will be called to get these scheduled.

## 2023-08-11 ENCOUNTER — Ambulatory Visit: Payer: Medicare HMO | Admitting: Rehabilitative and Restorative Service Providers"

## 2023-08-15 ENCOUNTER — Telehealth: Payer: Self-pay | Admitting: Neurology

## 2023-08-15 NOTE — Telephone Encounter (Signed)
 Ethlyn Gallery: [phone] exp. 08/15/23-10/14/23 sent to GI [phone]

## 2023-08-16 ENCOUNTER — Ambulatory Visit: Attending: Physician Assistant | Admitting: Rehabilitative and Restorative Service Providers"

## 2023-08-16 ENCOUNTER — Encounter: Payer: Self-pay | Admitting: Rehabilitative and Restorative Service Providers"

## 2023-08-16 DIAGNOSIS — R2681 Unsteadiness on feet: Secondary | ICD-10-CM | POA: Diagnosis not present

## 2023-08-16 DIAGNOSIS — M25511 Pain in right shoulder: Secondary | ICD-10-CM | POA: Insufficient documentation

## 2023-08-16 DIAGNOSIS — R293 Abnormal posture: Secondary | ICD-10-CM | POA: Diagnosis not present

## 2023-08-16 DIAGNOSIS — M25512 Pain in left shoulder: Secondary | ICD-10-CM | POA: Insufficient documentation

## 2023-08-16 DIAGNOSIS — M542 Cervicalgia: Secondary | ICD-10-CM | POA: Insufficient documentation

## 2023-08-16 DIAGNOSIS — M6281 Muscle weakness (generalized): Secondary | ICD-10-CM | POA: Insufficient documentation

## 2023-08-16 DIAGNOSIS — M5459 Other low back pain: Secondary | ICD-10-CM | POA: Insufficient documentation

## 2023-08-16 DIAGNOSIS — R42 Dizziness and giddiness: Secondary | ICD-10-CM | POA: Diagnosis not present

## 2023-08-16 DIAGNOSIS — R29898 Other symptoms and signs involving the musculoskeletal system: Secondary | ICD-10-CM | POA: Insufficient documentation
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
## 2023-08-17 ENCOUNTER — Other Ambulatory Visit: Payer: Self-pay | Admitting: Physician Assistant

## 2023-08-17 DIAGNOSIS — E66811 Obesity, class 1: Secondary | ICD-10-CM

## 2023-08-17 DIAGNOSIS — E1122 Type 2 diabetes mellitus with diabetic chronic kidney disease: Secondary | ICD-10-CM

## 2023-08-18 ENCOUNTER — Ambulatory Visit

## 2023-08-18 DIAGNOSIS — R42 Dizziness and giddiness: Secondary | ICD-10-CM | POA: Diagnosis not present

## 2023-08-18 DIAGNOSIS — R29898 Other symptoms and signs involving the musculoskeletal system: Secondary | ICD-10-CM

## 2023-08-18 DIAGNOSIS — M25511 Pain in right shoulder: Secondary | ICD-10-CM

## 2023-08-18 DIAGNOSIS — M6281 Muscle weakness (generalized): Secondary | ICD-10-CM | POA: Diagnosis not present

## 2023-08-18 DIAGNOSIS — R293 Abnormal posture: Secondary | ICD-10-CM

## 2023-08-18 DIAGNOSIS — M542 Cervicalgia: Secondary | ICD-10-CM

## 2023-08-18 DIAGNOSIS — M5459 Other low back pain: Secondary | ICD-10-CM

## 2023-08-18 DIAGNOSIS — R2681 Unsteadiness on feet: Secondary | ICD-10-CM

## 2023-08-18 DIAGNOSIS — M25512 Pain in left shoulder: Secondary | ICD-10-CM | POA: Diagnosis not present
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
## 2023-08-22 NOTE — Telephone Encounter (Signed)
 Patient called in and advised that she wants her MRI done at [facility]

## 2023-08-23 NOTE — Telephone Encounter (Signed)
 I changed the location on her PA to the [facility] in [place] and sent a message to Felicity Pellegrini to get her scheduled.

## 2023-08-23 NOTE — Telephone Encounter (Signed)
 Phone number [phone]

## 2023-08-26 DIAGNOSIS — E278 Other specified disorders of adrenal gland: Secondary | ICD-10-CM | POA: Diagnosis not present

## 2023-08-26 DIAGNOSIS — I10 Essential (primary) hypertension: Secondary | ICD-10-CM | POA: Diagnosis not present

## 2023-08-26 DIAGNOSIS — F17200 Nicotine dependence, unspecified, uncomplicated: Secondary | ICD-10-CM | POA: Diagnosis not present

## 2023-08-26 DIAGNOSIS — E669 Obesity, unspecified: Secondary | ICD-10-CM | POA: Diagnosis not present

## 2023-08-26 DIAGNOSIS — N329 Bladder disorder, unspecified: Secondary | ICD-10-CM | POA: Diagnosis not present

## 2023-08-26 DIAGNOSIS — E1169 Type 2 diabetes mellitus with other specified complication: Secondary | ICD-10-CM | POA: Diagnosis not present

## 2023-08-26 DIAGNOSIS — N183 Chronic kidney disease, stage 3 unspecified: Secondary | ICD-10-CM | POA: Diagnosis not present

## 2023-08-26 DIAGNOSIS — N3289 Other specified disorders of bladder: Secondary | ICD-10-CM | POA: Diagnosis not present

## 2023-08-29 ENCOUNTER — Ambulatory Visit: Admitting: Sports Medicine

## 2023-08-30 DIAGNOSIS — K5900 Constipation, unspecified: Secondary | ICD-10-CM | POA: Diagnosis not present

## 2023-08-30 DIAGNOSIS — I129 Hypertensive chronic kidney disease with stage 1 through stage 4 chronic kidney disease, or unspecified chronic kidney disease: Secondary | ICD-10-CM | POA: Diagnosis not present

## 2023-08-30 DIAGNOSIS — Z0000 Encounter for general adult medical examination without abnormal findings: Secondary | ICD-10-CM | POA: Diagnosis not present

## 2023-08-30 DIAGNOSIS — N1831 Chronic kidney disease, stage 3a: Secondary | ICD-10-CM | POA: Diagnosis not present

## 2023-08-30 DIAGNOSIS — Z13220 Encounter for screening for lipoid disorders: Secondary | ICD-10-CM | POA: Diagnosis not present

## 2023-08-30 DIAGNOSIS — E875 Hyperkalemia: Secondary | ICD-10-CM | POA: Diagnosis not present

## 2023-08-30 DIAGNOSIS — D7289 Other specified disorders of white blood cells: Secondary | ICD-10-CM | POA: Diagnosis not present

## 2023-08-30 DIAGNOSIS — E119 Type 2 diabetes mellitus without complications: Secondary | ICD-10-CM | POA: Diagnosis not present

## 2023-08-30 DIAGNOSIS — I10 Essential (primary) hypertension: Secondary | ICD-10-CM | POA: Diagnosis not present

## 2023-09-02 ENCOUNTER — Other Ambulatory Visit: Payer: Self-pay | Admitting: Physician Assistant

## 2023-09-02 DIAGNOSIS — R112 Nausea with vomiting, unspecified: Secondary | ICD-10-CM

## 2023-09-05 ENCOUNTER — Ambulatory Visit

## 2023-09-05 DIAGNOSIS — R519 Headache, unspecified: Secondary | ICD-10-CM | POA: Diagnosis not present

## 2023-09-05 DIAGNOSIS — R937 Abnormal findings on diagnostic imaging of other parts of musculoskeletal system: Secondary | ICD-10-CM

## 2023-09-05 DIAGNOSIS — R9082 White matter disease, unspecified: Secondary | ICD-10-CM | POA: Diagnosis not present

## 2023-09-05 DIAGNOSIS — R200 Anesthesia of skin: Secondary | ICD-10-CM | POA: Diagnosis not present

## 2023-09-05 DIAGNOSIS — M47813 Spondylosis without myelopathy or radiculopathy, cervicothoracic region: Secondary | ICD-10-CM | POA: Diagnosis not present

## 2023-09-05 DIAGNOSIS — G379 Demyelinating disease of central nervous system, unspecified: Secondary | ICD-10-CM | POA: Diagnosis not present

## 2023-09-05 DIAGNOSIS — M4802 Spinal stenosis, cervical region: Secondary | ICD-10-CM | POA: Diagnosis not present

## 2023-09-05 DIAGNOSIS — Q048 Other specified congenital malformations of brain: Secondary | ICD-10-CM | POA: Diagnosis not present

## 2023-09-05 DIAGNOSIS — M5030 Other cervical disc degeneration, unspecified cervical region: Secondary | ICD-10-CM | POA: Diagnosis not present

## 2023-09-05 MED ORDER — GADOBUTROL 1 MMOL/ML IV SOLN
9.5000 mL | Freq: Once | INTRAVENOUS | Status: AC | PRN
  Administered 2023-09-05: 9.5 mL via INTRAVENOUS

## 2023-09-06 ENCOUNTER — Ambulatory Visit (INDEPENDENT_AMBULATORY_CARE_PROVIDER_SITE_OTHER): Admitting: Sports Medicine

## 2023-09-06 DIAGNOSIS — G5603 Carpal tunnel syndrome, bilateral upper limbs: Secondary | ICD-10-CM

## 2023-09-06 DIAGNOSIS — R21 Rash and other nonspecific skin eruption: Secondary | ICD-10-CM | POA: Insufficient documentation

## 2023-09-06 DIAGNOSIS — M5030 Other cervical disc degeneration, unspecified cervical region: Secondary | ICD-10-CM | POA: Diagnosis not present

## 2023-09-06 NOTE — Assessment & Plan Note (Signed)
 At the end of the visit the patient brought to my attention some bumps over her face near the nasolabial folds, she was asking for an antibiotic to treat this, I informed her that this was not related to any of the medications I prescribed and that she really needs to talk to her primary care provider about this as it was outside my specialty.

## 2023-09-06 NOTE — Assessment & Plan Note (Addendum)
 58 year old female, chronic axial neck pain with multilevel cervical DDD, moderate severe central canal stenosis worse C4-C6, she does have some areas of myelomalacia in the spinal cord at this level. She has persistent discomfort with numbness and tingling over the dorsum of her left hand going to look to be the 2nd and 3rd fingers, she did does not endorse significant pain at night or numbness or tingling at night. She does not endorse significant paresthesias on the volar aspect of the hand as would typically be seen in carpal tunnel syndrome. 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 She has seen neurosurgery. Her last cervical epidural was in 2023. She is working with neurology as well. We have had her do physical therapy, she has had a couple courses of prednisone , currently taking gabapentin  3 times daily and Flexeril . Due to her persistent discomfort in spite of all of the above we will proceed with another cervical epidural, left C6-C7 interlaminar, we can stack 3 monthly, if all of the above fails then I think it would be reasonable to get a second opinion from another neurosurgeon regarding decompression. See me back 6 weeks after the epidural.  Of note Jordana repeatedly asked for the problem to be " fixed" and I did inform her of the incurable nature of this and that treatment is centered around management of the process, we set realistic expectations today.

## 2023-09-06 NOTE — Progress Notes (Addendum)
    Procedures performed today:    None.  Independent interpretation of notes and tests performed by another provider:   I personally reviewed her cervical spine MRI which does still show several areas of spondylitic central canal stenosis and myelomalacia.  Brief History, Exam, Impression, and Recommendations:    DDD (degenerative disc disease), cervical 58 year old female, chronic axial neck pain with multilevel cervical DDD, moderate severe central canal stenosis worse C4-C6, she does have some areas of myelomalacia in the spinal cord at this level. She has persistent discomfort with numbness and tingling over the dorsum of her left hand going to look to be the 2nd and 3rd fingers, she did does not endorse significant pain at night or numbness or tingling at night. She does not endorse significant paresthesias on the volar aspect of the hand as would typically be seen in carpal tunnel syndrome. 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 She has seen neurosurgery. Her last cervical epidural was in 2023. She is working with neurology as well. We have had her do physical therapy, she has had a couple courses of prednisone , currently taking gabapentin  3 times daily and Flexeril . Due to her persistent discomfort in spite of all of the above we will proceed with another cervical epidural, left C6-C7 interlaminar, we can stack 3 monthly, if all of the above fails then I think it would be reasonable to get a second opinion from another neurosurgeon regarding decompression. See me back 6 weeks after the epidural.  Of note Khala repeatedly asked for the problem to be " fixed" and I did inform her of the incurable nature of this and that treatment is centered around management of the process, we set realistic expectations today.  Facial rash At the end of the visit the patient brought  to my attention some bumps over her face near the nasolabial folds, she was asking for an antibiotic to treat this, I informed her that this was not related to any of the medications I prescribed and that she really needs to talk to her primary care provider about this as it was outside my specialty.  I spent 30 minutes of total time managing this patient today, this includes chart review, face to face, and non-face to face time.  ____________________________________________ Joselyn Nicely. Sandy Crumb, M.D., ABFM., CAQSM., AME. Primary Care and Sports Medicine [facility]  Adjunct Professor of [facility] Medicine  [employer]  Restaurant manager, fast food

## 2023-09-14 ENCOUNTER — Other Ambulatory Visit: Payer: Self-pay | Admitting: Physician Assistant

## 2023-09-14 ENCOUNTER — Ambulatory Visit: Payer: Medicare HMO | Admitting: Physician Assistant

## 2023-09-14 DIAGNOSIS — I10 Essential (primary) hypertension: Secondary | ICD-10-CM

## 2023-09-16 ENCOUNTER — Encounter: Payer: Self-pay | Admitting: Sports Medicine

## 2023-09-19 ENCOUNTER — Ambulatory Visit: Admitting: Physician Assistant

## 2023-09-19 NOTE — Discharge Instructions (Addendum)
 Post Procedure Spinal Discharge Instruction Sheet  You may resume a regular diet and any medications that you routinely take (including pain medications) unless otherwise noted by MD.  No driving day of procedure.  Light activity throughout the rest of the day.  Do not do any strenuous work, exercise, bending or lifting.  The day following the procedure, you can resume normal physical activity but you should refrain from exercising or physical therapy for at least three days thereafter.  You may apply ice to the injection site, 20 minutes on, 20 minutes off, as needed. Do not apply ice directly to skin.    Common Side Effects:  Headaches- take your usual medications as directed by your physician.  Increase your fluid intake.  Caffeinated beverages may be helpful.  Lie flat in bed until your headache resolves.  Restlessness or inability to sleep- you may have trouble sleeping for the next few days.  Ask your referring physician if you need any medication for sleep.  Facial flushing or redness- should subside within a few days.  Increased pain- a temporary increase in pain a day or two following your procedure is not unusual.  Take your pain medication as prescribed by your referring physician.  Leg cramps  Please contact our office at [phone] for the following symptoms: Fever greater than 100 degrees. Headaches unresolved with medication after 2-3 days. Increased swelling, pain, or redness at injection site.  YOU MAY RESUME YOUR ASPIRIN TODAY.  Thank you for visiting [facility] today.

## 2023-09-20 ENCOUNTER — Ambulatory Visit
Admission: RE | Admit: 2023-09-20 | Discharge: 2023-09-20 | Disposition: A | Discharge status: Home / Self Care | Source: Ambulatory Visit | Attending: Sports Medicine | Admitting: Sports Medicine

## 2023-09-20 DIAGNOSIS — M4722 Other spondylosis with radiculopathy, cervical region: Secondary | ICD-10-CM | POA: Diagnosis not present

## 2023-09-20 DIAGNOSIS — M5030 Other cervical disc degeneration, unspecified cervical region: Secondary | ICD-10-CM

## 2023-09-20 MED ORDER — TRIAMCINOLONE ACETONIDE 40 MG/ML IJ SUSP (RADIOLOGY)
60.0000 mg | Freq: Once | INTRAMUSCULAR | Status: AC
  Administered 2023-09-20: 60 mg via EPIDURAL

## 2023-09-20 MED ORDER — IOPAMIDOL (ISOVUE-M 300) INJECTION 61%
1.0000 mL | Freq: Once | INTRAMUSCULAR | Status: AC
  Administered 2023-09-20: 1 mL via EPIDURAL

## 2023-09-21 ENCOUNTER — Telehealth: Payer: Self-pay | Admitting: *Deleted

## 2023-09-21 ENCOUNTER — Ambulatory Visit: Admitting: Physician Assistant

## 2023-09-21 VITALS — BP 132/81 | HR 74 | Ht 67.0 in | Wt 209.0 lb

## 2023-09-21 DIAGNOSIS — N1832 Chronic kidney disease, stage 3b: Secondary | ICD-10-CM

## 2023-09-21 DIAGNOSIS — L732 Hidradenitis suppurativa: Secondary | ICD-10-CM

## 2023-09-21 DIAGNOSIS — E66811 Obesity, class 1: Secondary | ICD-10-CM

## 2023-09-21 DIAGNOSIS — G894 Chronic pain syndrome: Secondary | ICD-10-CM | POA: Insufficient documentation

## 2023-09-21 DIAGNOSIS — I10 Essential (primary) hypertension: Secondary | ICD-10-CM | POA: Diagnosis not present

## 2023-09-21 DIAGNOSIS — Z7984 Long term (current) use of oral hypoglycemic drugs: Secondary | ICD-10-CM

## 2023-09-21 DIAGNOSIS — E785 Hyperlipidemia, unspecified: Secondary | ICD-10-CM

## 2023-09-21 DIAGNOSIS — E1122 Type 2 diabetes mellitus with diabetic chronic kidney disease: Secondary | ICD-10-CM | POA: Diagnosis not present

## 2023-09-21 DIAGNOSIS — E6609 Other obesity due to excess calories: Secondary | ICD-10-CM | POA: Diagnosis not present

## 2023-09-21 DIAGNOSIS — M542 Cervicalgia: Secondary | ICD-10-CM | POA: Diagnosis not present

## 2023-09-21 DIAGNOSIS — N1831 Chronic kidney disease, stage 3a: Secondary | ICD-10-CM

## 2023-09-21 DIAGNOSIS — G8929 Other chronic pain: Secondary | ICD-10-CM

## 2023-09-21 DIAGNOSIS — E782 Mixed hyperlipidemia: Secondary | ICD-10-CM

## 2023-09-21 DIAGNOSIS — M5030 Other cervical disc degeneration, unspecified cervical region: Secondary | ICD-10-CM

## 2023-09-21 LAB — POCT GLYCOSYLATED HEMOGLOBIN (HGB A1C): Hemoglobin A1C: 6.4 % — AB (ref 4.0–5.6)

## 2023-09-21 MED ORDER — DOXYCYCLINE HYCLATE 100 MG PO TABS: 100.0000 mg | ORAL_TABLET | Freq: Two times a day (BID) | ORAL | 0 refills | Status: DC

## 2023-09-21 MED ORDER — OZEMPIC (2 MG/DOSE) 8 MG/3ML ~~LOC~~ SOPN
PEN_INJECTOR | SUBCUTANEOUS | 0 refills | Status: AC
Start: 2023-09-21 — End: ?

## 2023-09-21 MED ORDER — ATORVASTATIN CALCIUM 40 MG PO TABS: 40.0000 mg | ORAL_TABLET | Freq: Every day | ORAL | 3 refills | Status: AC

## 2023-09-21 NOTE — Patient Instructions (Addendum)
 Order carotid dopplers for screening purposes Increase lipitor to 40mg  daily to get LDL under 70 Doxycycline  for HS infection.  Referral to pain clinic  Pocket of Fluid in the Skin (Epidermoid Cyst): What to Know  An epidermoid cyst is a small lump under your skin. The cyst contains a substance that is thick and oily. What are the causes? A blocked hair follicle. A hair curls and re-enters the skin instead of growing straight out of the skin. A blocked pore. Irritated skin. An injury to the skin. Certain conditions that are passed from parent to child. Human papillomavirus (HPV). This happens rarely when cysts occur on the bottom of the feet. Long-term sun damage to the skin. What increases the risk? Having acne. Being female. Having an injury to the skin. Being past puberty. Certain conditions that are passed down through family (genetic disorder). What are the signs or symptoms? The only sign of this type of cyst may be a small, painless lump under the skin. These cysts are usually painless, but they can get infected. Symptoms of infection may include: Redness. Inflammation. Tenderness. Warmth. Fever. A bad-smelling substance that drains from the cyst. Pus that drains from the cyst. How is this treated? If a cyst becomes inflamed, treatment may include: Opening and draining the cyst. Antibiotics. Shots of medicines called steroids that help lessen inflammation. Surgery to remove the cyst if it's large, painful, or could turn into cancer. Do not try to open or squeeze a cyst yourself. Follow these instructions at home: Medicines Take your medicines only as told. If you were given antibiotics, take them as told. Do not stop taking them even if you start to feel better. General instructions Keep the area around your cyst clean and dry. Wear loose, dry clothing. Avoid touching your cyst. Check your cyst every day for signs of infection. Check for: Redness, swelling, or  pain. Fluid or blood. Warmth. Pus or a bad smell. Keep all follow-up visits to make sure there's no discomfort or infection. Contact a doctor if: You have any signs of infection. Your cyst doesn't get better or gets worse. You get a cyst that looks different from other cysts you've had. You have a fever. You have redness that spreads from the cyst. This information is not intended to replace advice given to you by your health care provider. Make sure you discuss any questions you have with your health care provider. Document Revised: 12/17/2022 Document Reviewed: 12/17/2022 Elsevier Patient Education  2024 [employer].

## 2023-09-21 NOTE — Telephone Encounter (Signed)
-----   Message from Jorie Newness sent at 09/20/2023  5:20 PM EDT ----- Please let her know that I took a look at the MRI of the cervical spine and the brain.  The brain showed a small stroke in a part of the brain that could explain left-sided symptoms.  Therefore, I want her to start taking a baby aspirin (81 mg) daily.  She should not take a higher dose to lower risk of bleeding.  I think most of the changes in the brain are due to tiny strokes rather than MS.  The MRI of the spinal cord looked similar to the previous one.  You have significant spine degenerative changes with spinal stenosis at C4-C5 and C5-C6.  This could also cause some pain and numbness into the arms.  You still have the one spot in your spinal cord but it no longer shows up after the IV contrast.

## 2023-09-21 NOTE — Telephone Encounter (Signed)
 Called pt. Relayed results per Dr. Thom Fleeting note. Pt verbalized understanding. She had ESI cervical yesterday ordered by Dr. Sandy Crumb. States it helped ease sx.   However, having ongoing numbness in legs for past 1.5 months. More severe now. Legs week and falling. Wanting to know if Dr. Godwin Lat would recommend [facility] for this or what else he would recommend for treatment. Using cane. Aware I will send to MD and call back with his response.
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
## 2023-09-21 NOTE — Telephone Encounter (Signed)
 Is there anything else you would recommend for her?

## 2023-09-22 NOTE — Telephone Encounter (Signed)
 Called and LVM for pt relayed Dr. Thom Fleeting recommendation. Advised her to call back if any further questions.

## 2023-09-22 NOTE — Telephone Encounter (Signed)
 Pt has returned call to [place], [place]

## 2023-09-29 ENCOUNTER — Encounter: Payer: Self-pay | Admitting: Physician Assistant

## 2023-09-29 ENCOUNTER — Other Ambulatory Visit: Payer: Self-pay | Admitting: Physician Assistant

## 2023-09-29 DIAGNOSIS — E1122 Type 2 diabetes mellitus with diabetic chronic kidney disease: Secondary | ICD-10-CM

## 2023-09-30 ENCOUNTER — Encounter: Payer: Self-pay | Admitting: Physician Assistant

## 2023-09-30 NOTE — Telephone Encounter (Signed)
 I called the pharmacy and the prescription went through with a $0 cost. I advised patient.

## 2023-09-30 NOTE — Telephone Encounter (Signed)
 I'm showing toujeo  is preferred can we call pharmacy and see what is going on?

## 2023-10-03 ENCOUNTER — Encounter: Payer: Self-pay | Admitting: Physician Assistant

## 2023-10-03 DIAGNOSIS — G8929 Other chronic pain: Secondary | ICD-10-CM | POA: Insufficient documentation

## 2023-10-03 MED ORDER — LOSARTAN POTASSIUM 25 MG PO TABS: 25.0000 mg | ORAL_TABLET | Freq: Every day | ORAL | 1 refills | Status: AC

## 2023-10-05 ENCOUNTER — Other Ambulatory Visit (HOSPITAL_COMMUNITY): Payer: Self-pay

## 2023-10-05 DIAGNOSIS — E278 Other specified disorders of adrenal gland: Secondary | ICD-10-CM | POA: Diagnosis not present

## 2023-10-05 DIAGNOSIS — E279 Disorder of adrenal gland, unspecified: Secondary | ICD-10-CM | POA: Diagnosis not present

## 2023-10-05 DIAGNOSIS — K429 Umbilical hernia without obstruction or gangrene: Secondary | ICD-10-CM | POA: Diagnosis not present

## 2023-10-07 ENCOUNTER — Ambulatory Visit (INDEPENDENT_AMBULATORY_CARE_PROVIDER_SITE_OTHER)

## 2023-10-07 DIAGNOSIS — E785 Hyperlipidemia, unspecified: Secondary | ICD-10-CM

## 2023-10-07 DIAGNOSIS — N1832 Chronic kidney disease, stage 3b: Secondary | ICD-10-CM

## 2023-10-11 ENCOUNTER — Ambulatory Visit: Payer: Self-pay | Admitting: Physician Assistant

## 2023-10-11 NOTE — Progress Notes (Signed)
 Carotid dopplers show no significant plaque accumulation. GREAT news.

## 2023-10-18 ENCOUNTER — Ambulatory Visit (INDEPENDENT_AMBULATORY_CARE_PROVIDER_SITE_OTHER): Admitting: Sports Medicine

## 2023-10-18 DIAGNOSIS — M25552 Pain in left hip: Secondary | ICD-10-CM | POA: Diagnosis not present

## 2023-10-18 DIAGNOSIS — M5030 Other cervical disc degeneration, unspecified cervical region: Secondary | ICD-10-CM

## 2023-10-18 DIAGNOSIS — G8929 Other chronic pain: Secondary | ICD-10-CM

## 2023-10-18 MED ORDER — ACETAMINOPHEN ER 650 MG PO TBCR: 650.0000 mg | EXTENDED_RELEASE_TABLET | Freq: Three times a day (TID) | ORAL | Status: AC | PRN

## 2023-10-18 NOTE — Assessment & Plan Note (Signed)
 Nina Trujillo also has some chronic left hip pain, she endorses this in the groin, it is worse with internal rotation, not worse with resisted flexion, this is consistent with hip joint pain generator, and MRI pelvis in the past did show subchondral cystic changes in both hips, adding x-rays, formal PT, acetaminophen, suspect more hip arthritic process.

## 2023-10-18 NOTE — Progress Notes (Signed)
    Procedures performed today:    None.  Independent interpretation of notes and tests performed by another provider:   None.  Brief History, Exam, Impression, and Recommendations:    DDD (degenerative disc disease), cervical This very pleasant 58 year old female returns, chronic axial neck pain with multilevel cervical DDD, she has moderate to severe central canal stenosis worst from C4-C6, she also has some areas of myelomalacia at the spinal cord. She does also have widespread demyelinating disease in her brain, questionable multiple sclerosis, she is followed by neurology. Her most recent cervical spine and brain MRI did show progression of the demyelinating processes. 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 She has seen neurosurgery, we saw her about a month and a half ago, ordered a cervical epidural which has helped her neck pain, she does continue to endorse a burning sensation in the left hand dorsal between the 2nd and 3rd metacarpals, not entirely reproducible with palpation. She is currently taking gabapentin  6 mg nightly and an occasional 300 mg in the morning, this is essentially her max tolerable dose. We will go ahead and add acetaminophen 3 times daily, formal physical therapy for her hand, her neck, and her hip which is also hurting her. Follow-up in 6 weeks.  Chronic left hip pain [name] also has some chronic left hip pain, she endorses this in the groin, it is worse with internal rotation, not worse with resisted flexion, this is consistent with hip joint pain generator, and MRI pelvis in the past did show subchondral cystic changes in both hips, adding x-rays, formal PT, acetaminophen, suspect more hip arthritic process.    ____________________________________________ Nina Trujillo. Nina Trujillo, M.D., ABFM., CAQSM., AME. Primary Care and Sports Medicine [place]  [facility]  Adjunct Professor of [facility] Medicine  [employer]  Restaurant manager, fast food

## 2023-10-18 NOTE — Assessment & Plan Note (Signed)
 This very pleasant 58 year old female returns, chronic axial neck pain with multilevel cervical DDD, she has moderate to severe central canal stenosis worst from C4-C6, she also has some areas of myelomalacia at the spinal cord. She does also have widespread demyelinating disease in her brain, questionable multiple sclerosis, she is followed by neurology. Her most recent cervical spine and brain MRI did show progression of the demyelinating processes. 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 She has seen neurosurgery, we saw her about a month and a half ago, ordered a cervical epidural which has helped her neck pain, she does continue to endorse a burning sensation in the left hand dorsal between the 2nd and 3rd metacarpals, not entirely reproducible with palpation. She is currently taking gabapentin  6 mg nightly and an occasional 300 mg in the morning, this is essentially her max tolerable dose. We will go ahead and add acetaminophen 3 times daily, formal physical therapy for her hand, her neck, and her hip which is also hurting her. Follow-up in 6 weeks.

## 2023-10-25 DIAGNOSIS — Z23 Encounter for immunization: Secondary | ICD-10-CM | POA: Diagnosis not present

## 2023-10-25 DIAGNOSIS — Z0000 Encounter for general adult medical examination without abnormal findings: Secondary | ICD-10-CM | POA: Diagnosis not present

## 2023-10-25 DIAGNOSIS — I639 Cerebral infarction, unspecified: Secondary | ICD-10-CM | POA: Diagnosis not present

## 2023-10-25 DIAGNOSIS — G939 Disorder of brain, unspecified: Secondary | ICD-10-CM | POA: Diagnosis not present

## 2023-10-29 ENCOUNTER — Other Ambulatory Visit: Payer: Self-pay | Admitting: Physician Assistant

## 2023-10-29 DIAGNOSIS — N1832 Chronic kidney disease, stage 3b: Secondary | ICD-10-CM

## 2023-10-29 DIAGNOSIS — E66811 Obesity, class 1: Secondary | ICD-10-CM

## 2023-10-31 ENCOUNTER — Encounter: Payer: Self-pay | Admitting: Physician Assistant

## 2023-10-31 DIAGNOSIS — M533 Sacrococcygeal disorders, not elsewhere classified: Secondary | ICD-10-CM | POA: Diagnosis not present

## 2023-10-31 DIAGNOSIS — Z79899 Other long term (current) drug therapy: Secondary | ICD-10-CM | POA: Diagnosis not present

## 2023-10-31 DIAGNOSIS — G379 Demyelinating disease of central nervous system, unspecified: Secondary | ICD-10-CM | POA: Diagnosis not present

## 2023-10-31 DIAGNOSIS — M79642 Pain in left hand: Secondary | ICD-10-CM | POA: Diagnosis not present

## 2023-11-01 ENCOUNTER — Ambulatory Visit: Attending: Physician Assistant | Admitting: Physical Therapy

## 2023-11-01 NOTE — Telephone Encounter (Signed)
 Let her know we cannot just send antibiotics. We could do a virtual at 3:40 today.

## 2023-11-02 ENCOUNTER — Telehealth: Admitting: Urgent Care

## 2023-11-02 ENCOUNTER — Encounter: Payer: Self-pay | Admitting: Urgent Care

## 2023-11-02 DIAGNOSIS — L732 Hidradenitis suppurativa: Secondary | ICD-10-CM | POA: Diagnosis not present

## 2023-11-02 MED ORDER — SPIRONOLACTONE 25 MG PO TABS: 12.5000 mg | ORAL_TABLET | Freq: Every day | ORAL | 0 refills | Status: DC

## 2023-11-02 MED ORDER — CLINDAMYCIN PHOSPHATE 1 % EX SOLN: Freq: Two times a day (BID) | CUTANEOUS | 0 refills | Status: DC

## 2023-11-02 MED ORDER — CLINDAMYCIN HCL 150 MG PO CAPS: 150.0000 mg | ORAL_CAPSULE | Freq: Three times a day (TID) | ORAL | 0 refills | Status: AC

## 2023-11-02 NOTE — Progress Notes (Signed)
 Virtual Visit via Video Note  I connected with Clarine Cromer on 11/02/23 at 10:20 AM EDT by a video enabled telemedicine application and verified that I am speaking with the correct person using two identifiers.  Patient Location: Home Provider Location: Office/Clinic  I discussed the limitations, risks, security, and privacy concerns of performing an evaluation and management service by video and the availability of in person appointments. I also discussed with the patient that there may be a patient responsible charge related to this service. The patient expressed understanding and agreed to proceed.   Subjective  PCP: Araceli Knight, PA-C  Chief Complaint  Patient presents with   Recurrent Skin Infections    Pt states she has a couple of boils under her right arm. She only she gets them intermittently and they are very painful she knows she needs surgery but has been to busy to have it done. She also needs a diagnostic mammogram she has a lump     HPI:  Pleasant 58yo female with known hx of HS presents today for another flare. Reports several painful boils primarily to R axilla. This has been a longstanding issue for her. She at one point consulted with surgeon to remove the sinus tracts, but this procedure never took place. Last flare was approximately 6 weeks ago. She has been using doxycycline  primarily during these flares, which helps to some degree. She is not on any preventative medications at this time. Reports current sx to axilla only, denies systemic sx.  ROS: Per HPI. All other pertinent systems are negative.   Current Outpatient Medications:    acetaminophen  (TYLENOL ) 650 MG CR tablet, Take 1 tablet (650 mg total) by mouth every 8 (eight) hours as needed for pain., Disp: , Rfl:    atorvastatin  (LIPITOR) 40 MG tablet, Take 1 tablet (40 mg total) by mouth daily., Disp: 90 tablet, Rfl: 3   Biotin w/ Vitamins C & E (HAIR/SKIN/NAILS PO), Take 1 capsule by mouth at  bedtime., Disp: , Rfl:    buPROPion  (WELLBUTRIN  SR) 200 MG 12 hr tablet, TAKE ONE TABLET (200 MG TOTAL) BY MOUTH TWICE DAILY @ 9AM & 9PM, Disp: 180 tablet, Rfl: 11   carbamazepine  (TEGRETOL ) 200 MG tablet, Take 1 tablet (200 mg total) by mouth in the morning and at bedtime., Disp: 60 tablet, Rfl: 5   cholecalciferol (VITAMIN D3) 25 MCG (1000 UNIT) tablet, Take 1,000 Units by mouth daily., Disp: , Rfl:    clindamycin  (CLEOCIN  T) 1 % external solution, Apply topically 2 (two) times daily., Disp: 30 mL, Rfl: 0   clindamycin  (CLEOCIN ) 150 MG capsule, Take 1 capsule (150 mg total) by mouth 3 (three) times daily for 7 days., Disp: 21 capsule, Rfl: 0   Continuous Glucose Sensor (DEXCOM G7 SENSOR) MISC, USE DEVICE DAILY. CHANGE EVERY 10 DAYS, Disp: 3 each, Rfl: 5   Continuous Glucose Transmitter (DEXCOM G6 TRANSMITTER) MISC, Use for continuous glucose, Disp: 1 each, Rfl: PRN   cyclobenzaprine  (FLEXERIL ) 10 MG tablet, TAKE ONE TABLET (10MG  TOTAL) BY MOUTH THREE TIMES DAILY AS NEEDED FOR MUSCLE SPASMS, Disp: 90 tablet, Rfl: 11   doxycycline  (VIBRA -TABS) 100 MG tablet, Take 1 tablet (100 mg total) by mouth 2 (two) times daily., Disp: 20 tablet, Rfl: 0   escitalopram  (LEXAPRO ) 20 MG tablet, TAKE ONE TABLET (20 MG TOTAL) BY MOUTH DAILY AT 9AM NEEDS APPOINTMENT, Disp: 90 tablet, Rfl: 11   estradiol  (ESTRACE ) 2 MG tablet, TAKE ONE TABLET BY MOUTH DAILY, Disp: 90  tablet, Rfl: 11   FOLBEE 2.5-25-1 MG TABS tablet, TAKE ONE TABLET BY MOUTH DAILY AT 9AM, Disp: 90 tablet, Rfl: 2   gabapentin  (NEURONTIN ) 300 MG capsule, TAKE ONE CAPSULE BY MOUTH THREE TIMES DAILY @9AM -1PM-5PM, Disp: 270 capsule, Rfl: 11   hydrochlorothiazide  (HYDRODIURIL ) 25 MG tablet, TAKE ONE TABLET (25 MG TOTAL) BY MOUTH DAILY AT 9PM AT BEDTIME, Disp: 90 tablet, Rfl: 11   insulin  glargine, 2 Unit Dial , (TOUJEO  MAX SOLOSTAR) 300 UNIT/ML Solostar Pen, Inject 13 Units into the skin at bedtime., Disp: 3 mL, Rfl: 1   linaclotide  (LINZESS ) 290 MCG CAPS  capsule, Take 1 capsule (290 mcg total) by mouth daily. For constipation., Disp: 90 capsule, Rfl: 3   losartan  (COZAAR ) 25 MG tablet, Take 1 tablet (25 mg total) by mouth daily., Disp: 90 tablet, Rfl: 1   metFORMIN  (GLUCOPHAGE ) 1000 MG tablet, TAKE ONE TABLET BY MOUTH TWICE DAILY @ 9AM & 5PM WITH A MEAL, Disp: 180 tablet, Rfl: 11   modafinil  (PROVIGIL ) 200 MG tablet, Take 1 tablet (200 mg total) by mouth daily., Disp: 30 tablet, Rfl: 5   omeprazole  (PRILOSEC) 40 MG capsule, TAKE ONE CAPSULE (40 mg total) BY MOUTH DAILY AT 9AM, Disp: 90 capsule, Rfl: 11   ondansetron  (ZOFRAN -ODT) 8 MG disintegrating tablet, TAKE ONE TABLET (8 MG TOTAL) BY MOUTH DAILY, Disp: 20 tablet, Rfl: 11   Semaglutide , 2 MG/DOSE, (OZEMPIC , 2 MG/DOSE,) 8 MG/3ML SOPN, INJECT 2 MG SUBCUTANEOUSLY ONCE A WEEK, Disp: 9 mL, Rfl: 0   spironolactone (ALDACTONE) 25 MG tablet, Take 0.5 tablets (12.5 mg total) by mouth daily., Disp: 14 tablet, Rfl: 0   triamcinolone  cream (KENALOG ) 0.1 %, Apply 1 application topically 2 (two) times daily. (Patient taking differently: Apply 1 application  topically 2 (two) times daily as needed (skin irritation.).), Disp: 80 g, Rfl: 0   AMBULATORY NON FORMULARY MEDICATION, Glucometer device and lancets and test strips.  Dx. Diabetes 250.0 controlled   Test 1 to 3 times a day. (Patient not taking: Reported on 11/02/2023), Disp: 1 Device, Rfl: 0   AMBULATORY NON FORMULARY MEDICATION, 4 pronged cane to be used for ambulation (Patient not taking: Reported on 11/02/2023), Disp: 1 Device, Rfl: 0    Objective  Vital signs not able to be obtained due to this being a virtual visit.  Physical Exam Nursing note reviewed.  Constitutional:      General: She is not in acute distress.    Appearance: Normal appearance. She is not ill-appearing, toxic-appearing or diaphoretic.  Pulmonary:     Effort: Pulmonary effort is normal. No respiratory distress.   Skin:    General: Skin is warm.     Findings: Lesion Vallerie Gave  Stage 3 HS noted to R axilla with two active boils noted) present.   Neurological:     Mental Status: She is alert and oriented to person, place, and time.   Psychiatric:        Mood and Affect: Mood normal.        Behavior: Behavior normal.      Assessment & Plan Hidradenitis suppurativa  1. Hidradenitis suppurativa  Will start 12.5mg  spironolactone daily and topical clindamycin  for prevention of HS flares. For current active flare, switch to PO clindamycin  TID. Discussed diarrhea risk - pt states she struggles with chronic constipation. Encouraged use of OTC florastor to prevent C.diff.  Pt encouraged to schedule follow up in one month to assess preventative maintenance and to obtain labs related to aldactone/ losartan  use concurrently. Will need  K+ monitoring.   No follow-ups on file.   I discussed the assessment and treatment plan with the patient. The patient was provided an opportunity to ask questions, and all were answered. The patient agreed with the plan and demonstrated an understanding of the instructions.   The patient was advised to call back or seek an in-person evaluation if the symptoms worsen or if the condition fails to improve as anticipated.  The above assessment and management plan was discussed with the patient. The patient verbalized understanding of and has agreed to the management plan.   Mandy Second, PA

## 2023-11-02 NOTE — Telephone Encounter (Signed)
 Patient scheduled at 10:20am with Whitney crain

## 2023-11-02 NOTE — Assessment & Plan Note (Signed)
1. Hidradenitis suppurativa.

## 2023-11-02 NOTE — Patient Instructions (Signed)
 Please apply warm compresses to the affect area daily.   Start clindamycin  daily - take three times daily x 7 days. Monitor for upset stomach or diarrhea. Please take OTC Florastor, a probiotic, to help offset any upset stomach. If diarrhea persists once treatment has been completed, notify our office immediately.   For prevention, please use topical clindamycin  solution on a cotton pad 1-2x/ day. Gently massage this into the skin. Please take 1/2 tab (12.5mg ) of spironolactone daily.   Schedule a follow up visit in approximately one month to reassess skin and response to preventative management. You will need labs at that time.

## 2023-11-14 ENCOUNTER — Other Ambulatory Visit: Payer: Self-pay | Admitting: Neurology

## 2023-11-14 MED ORDER — FOLBEE 2.5-25-1 MG PO TABS: ORAL_TABLET | ORAL | 3 refills | Status: DC

## 2023-11-14 NOTE — Telephone Encounter (Signed)
 Last seen on 08/08/23 No 6 month follow you scheduled  Are you wanting to refill for patient or should patient have Rx filled by PCP?

## 2023-11-17 ENCOUNTER — Other Ambulatory Visit: Payer: Self-pay

## 2023-11-17 ENCOUNTER — Ambulatory Visit: Attending: Sports Medicine

## 2023-11-17 DIAGNOSIS — M542 Cervicalgia: Secondary | ICD-10-CM | POA: Diagnosis present

## 2023-11-17 DIAGNOSIS — R2681 Unsteadiness on feet: Secondary | ICD-10-CM | POA: Diagnosis present

## 2023-11-17 DIAGNOSIS — M5030 Other cervical disc degeneration, unspecified cervical region: Secondary | ICD-10-CM | POA: Insufficient documentation

## 2023-11-17 DIAGNOSIS — M6281 Muscle weakness (generalized): Secondary | ICD-10-CM | POA: Insufficient documentation

## 2023-11-17 DIAGNOSIS — M25552 Pain in left hip: Secondary | ICD-10-CM | POA: Insufficient documentation

## 2023-11-17 DIAGNOSIS — M79602 Pain in left arm: Secondary | ICD-10-CM | POA: Diagnosis present

## 2023-11-17 DIAGNOSIS — M5459 Other low back pain: Secondary | ICD-10-CM | POA: Insufficient documentation
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
## 2023-11-22 ENCOUNTER — Ambulatory Visit
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
## 2023-11-29 ENCOUNTER — Ambulatory Visit: Admitting: Sports Medicine

## 2023-12-01 ENCOUNTER — Ambulatory Visit

## 2023-12-01 ENCOUNTER — Encounter: Payer: Self-pay | Admitting: Sports Medicine

## 2023-12-01 ENCOUNTER — Ambulatory Visit (INDEPENDENT_AMBULATORY_CARE_PROVIDER_SITE_OTHER): Admitting: Sports Medicine

## 2023-12-01 DIAGNOSIS — M25552 Pain in left hip: Secondary | ICD-10-CM

## 2023-12-01 DIAGNOSIS — R2681 Unsteadiness on feet: Secondary | ICD-10-CM | POA: Diagnosis not present

## 2023-12-01 DIAGNOSIS — G8929 Other chronic pain: Secondary | ICD-10-CM

## 2023-12-01 DIAGNOSIS — M6281 Muscle weakness (generalized): Secondary | ICD-10-CM

## 2023-12-01 DIAGNOSIS — M79602 Pain in left arm: Secondary | ICD-10-CM | POA: Diagnosis not present

## 2023-12-01 DIAGNOSIS — M542 Cervicalgia: Secondary | ICD-10-CM | POA: Diagnosis not present

## 2023-12-01 DIAGNOSIS — M5459 Other low back pain: Secondary | ICD-10-CM | POA: Diagnosis not present

## 2023-12-01 DIAGNOSIS — M5416 Radiculopathy, lumbar region: Secondary | ICD-10-CM

## 2023-12-01 DIAGNOSIS — M5030 Other cervical disc degeneration, unspecified cervical region: Secondary | ICD-10-CM | POA: Diagnosis not present

## 2023-12-01 NOTE — Progress Notes (Signed)
    Procedures performed today:    None.  Independent interpretation of notes and tests performed by another provider:   None.  Brief History, Exam, Impression, and Recommendations:    Chronic left hip pain Nina Trujillo returns today, we saw her in early June with chronic left hip pain endorsed in the groin as well as somewhat more proximal and lateral. At the last visit was worse with internal rotation but not so much with resisted flexion. We did review an MRI pelvis from the past which showed some subchondral cystic changes in both hips per Added x-rays which she did not do. I also added formal PT, which she has just now started. Today she does not have as much pain with internal rotation of the hip but does have reproduction of pain resisted hip flexion This raises the suspicion of more of a hip flexor tendinopathy. I would like PT to work aggressively on her hip flexors before we consider something like hip joint injection.  Left lumbar radiculitis Nina Trujillo also has chronic discomfort left low back with radiation to the back of the left lower leg, historically to the middle of the foot, we suspected L5 distribution radiculitis, she needs to do more physical therapy performed comfortable proceeding with intervention. She did see a pain management doctor Dr. Trudy who has suggested a left sacroiliac joint injection, I think this is reasonable to try and if this fails lumbar spine MRI and left L5-S1 transforaminal epidural. It sounds like she got switched to Lyrica, she is taking 2 capsules at night and this seems to help. On review of PDMP she filled gabapentin  after she filled her Lyrica, I advised her to avoid taking both together, do not take the gabapentin  if she is going to be taking Lyrica. She avoids daytime dosing due to sedation. Lastly I advised her that we should not have too many cooks in the kitchen, and that we should really try to have only 1 provider managing a body  structure at a time.    ____________________________________________ Nina Trujillo, M.D., ABFM., CAQSM., AME. Primary Care and Sports Medicine [facility]  Adjunct Professor of [facility] Medicine  [employer]  Restaurant manager, fast food

## 2023-12-01 NOTE — Assessment & Plan Note (Addendum)
 Nina Trujillo also has chronic discomfort left low back with radiation to the back of the left lower leg, historically to the middle of the foot, we suspected L5 distribution radiculitis, she needs to do more physical therapy performed comfortable proceeding with intervention. She did see a pain management doctor Dr. Trudy who has suggested a left sacroiliac joint injection, I think this is reasonable to try and if this fails lumbar spine MRI and left L5-S1 transforaminal epidural. It sounds like she got switched to Lyrica, she is taking 2 capsules at night and this seems to help. On review of PDMP she filled gabapentin  after she filled her Lyrica, I advised her to avoid taking both together, do not take the gabapentin  if she is going to be taking Lyrica. She avoids daytime dosing due to sedation. Lastly I advised her that we should not have too many cooks in the kitchen, and that we should really try to have only 1 provider managing a body structure at a time.

## 2023-12-01 NOTE — Assessment & Plan Note (Signed)
 Nina Trujillo returns today, we saw her in early June with chronic left hip pain endorsed in the groin as well as somewhat more proximal and lateral. At the last visit was worse with internal rotation but not so much with resisted flexion. We did review an MRI pelvis from the past which showed some subchondral cystic changes in both hips per Added x-rays which she did not do. I also added formal PT, which she has just now started. Today she does not have as much pain with internal rotation of the hip but does have reproduction of pain resisted hip flexion This raises the suspicion of more of a hip flexor tendinopathy. I would like PT to work aggressively on her hip flexors before we consider something like hip joint injection.
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
## 2023-12-05 ENCOUNTER — Ambulatory Visit

## 2023-12-05 DIAGNOSIS — M6281 Muscle weakness (generalized): Secondary | ICD-10-CM | POA: Diagnosis not present

## 2023-12-05 DIAGNOSIS — G8929 Other chronic pain: Secondary | ICD-10-CM

## 2023-12-05 DIAGNOSIS — M542 Cervicalgia: Secondary | ICD-10-CM | POA: Diagnosis not present

## 2023-12-05 DIAGNOSIS — M79642 Pain in left hand: Secondary | ICD-10-CM | POA: Diagnosis not present

## 2023-12-05 DIAGNOSIS — I878 Other specified disorders of veins: Secondary | ICD-10-CM | POA: Diagnosis not present

## 2023-12-05 DIAGNOSIS — M25552 Pain in left hip: Secondary | ICD-10-CM

## 2023-12-05 DIAGNOSIS — M79602 Pain in left arm: Secondary | ICD-10-CM | POA: Diagnosis not present

## 2023-12-05 DIAGNOSIS — M5030 Other cervical disc degeneration, unspecified cervical region: Secondary | ICD-10-CM

## 2023-12-05 DIAGNOSIS — M160 Bilateral primary osteoarthritis of hip: Secondary | ICD-10-CM | POA: Diagnosis not present

## 2023-12-05 DIAGNOSIS — M5459 Other low back pain: Secondary | ICD-10-CM | POA: Diagnosis not present

## 2023-12-05 DIAGNOSIS — M25542 Pain in joints of left hand: Secondary | ICD-10-CM | POA: Diagnosis not present

## 2023-12-05 DIAGNOSIS — R2681 Unsteadiness on feet: Secondary | ICD-10-CM | POA: Diagnosis not present
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
## 2023-12-08 ENCOUNTER — Other Ambulatory Visit: Payer: Self-pay | Admitting: Neurology

## 2023-12-08 ENCOUNTER — Ambulatory Visit

## 2023-12-08 DIAGNOSIS — M6281 Muscle weakness (generalized): Secondary | ICD-10-CM

## 2023-12-08 DIAGNOSIS — M25552 Pain in left hip: Secondary | ICD-10-CM

## 2023-12-08 DIAGNOSIS — M79602 Pain in left arm: Secondary | ICD-10-CM

## 2023-12-08 DIAGNOSIS — M5030 Other cervical disc degeneration, unspecified cervical region: Secondary | ICD-10-CM | POA: Diagnosis not present

## 2023-12-08 DIAGNOSIS — M5459 Other low back pain: Secondary | ICD-10-CM | POA: Diagnosis not present

## 2023-12-08 DIAGNOSIS — R2681 Unsteadiness on feet: Secondary | ICD-10-CM | POA: Diagnosis not present

## 2023-12-08 DIAGNOSIS — M542 Cervicalgia: Secondary | ICD-10-CM | POA: Diagnosis not present

## 2023-12-08 NOTE — Telephone Encounter (Signed)
 Last seen on 08/08/23 Follow up scheduled
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
## 2023-12-11 ENCOUNTER — Ambulatory Visit: Payer: Self-pay | Admitting: Sports Medicine

## 2023-12-12 ENCOUNTER — Ambulatory Visit

## 2023-12-14 ENCOUNTER — Other Ambulatory Visit: Payer: Self-pay

## 2023-12-14 ENCOUNTER — Telehealth: Payer: Self-pay | Admitting: Neurology

## 2023-12-14 NOTE — Telephone Encounter (Signed)
 Pt is requesting a refill for Folic Acid -Vit B6-Vit B12 (FOLBEE) 2.5-25-1 MG TABS tablet.  Pharmacy: [facility]

## 2023-12-14 NOTE — Telephone Encounter (Signed)
 Called pt and let her know that Dr. Vear sent in her script to [facility] on 12/08/2023.

## 2023-12-15 ENCOUNTER — Ambulatory Visit

## 2023-12-15 DIAGNOSIS — M6281 Muscle weakness (generalized): Secondary | ICD-10-CM

## 2023-12-15 DIAGNOSIS — M5459 Other low back pain: Secondary | ICD-10-CM | POA: Diagnosis not present

## 2023-12-15 DIAGNOSIS — M79602 Pain in left arm: Secondary | ICD-10-CM | POA: Diagnosis not present

## 2023-12-15 DIAGNOSIS — R2681 Unsteadiness on feet: Secondary | ICD-10-CM

## 2023-12-15 DIAGNOSIS — M542 Cervicalgia: Secondary | ICD-10-CM

## 2023-12-15 DIAGNOSIS — M5030 Other cervical disc degeneration, unspecified cervical region: Secondary | ICD-10-CM | POA: Diagnosis not present

## 2023-12-15 DIAGNOSIS — M25552 Pain in left hip: Secondary | ICD-10-CM
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
## 2023-12-19 ENCOUNTER — Ambulatory Visit: Attending: Sports Medicine

## 2023-12-19 DIAGNOSIS — M25552 Pain in left hip: Secondary | ICD-10-CM | POA: Insufficient documentation

## 2023-12-19 DIAGNOSIS — M542 Cervicalgia: Secondary | ICD-10-CM | POA: Insufficient documentation

## 2023-12-19 DIAGNOSIS — M79602 Pain in left arm: Secondary | ICD-10-CM | POA: Diagnosis present

## 2023-12-19 DIAGNOSIS — M6281 Muscle weakness (generalized): Secondary | ICD-10-CM | POA: Insufficient documentation

## 2023-12-19 DIAGNOSIS — M5459 Other low back pain: Secondary | ICD-10-CM | POA: Insufficient documentation
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
## 2023-12-21 ENCOUNTER — Ambulatory Visit

## 2023-12-22 ENCOUNTER — Other Ambulatory Visit: Payer: Self-pay | Admitting: Physician Assistant

## 2023-12-22 DIAGNOSIS — E1122 Type 2 diabetes mellitus with diabetic chronic kidney disease: Secondary | ICD-10-CM

## 2023-12-23 ENCOUNTER — Ambulatory Visit (INDEPENDENT_AMBULATORY_CARE_PROVIDER_SITE_OTHER): Admitting: Physician Assistant

## 2023-12-23 ENCOUNTER — Encounter: Payer: Self-pay | Admitting: Physician Assistant

## 2023-12-23 VITALS — BP 124/75 | HR 74 | Ht 67.0 in | Wt 215.0 lb

## 2023-12-23 DIAGNOSIS — G379 Demyelinating disease of central nervous system, unspecified: Secondary | ICD-10-CM

## 2023-12-23 DIAGNOSIS — R4189 Other symptoms and signs involving cognitive functions and awareness: Secondary | ICD-10-CM | POA: Diagnosis not present

## 2023-12-23 DIAGNOSIS — N1831 Chronic kidney disease, stage 3a: Secondary | ICD-10-CM

## 2023-12-23 DIAGNOSIS — F331 Major depressive disorder, recurrent, moderate: Secondary | ICD-10-CM

## 2023-12-23 DIAGNOSIS — E1122 Type 2 diabetes mellitus with diabetic chronic kidney disease: Secondary | ICD-10-CM

## 2023-12-23 DIAGNOSIS — E66811 Obesity, class 1: Secondary | ICD-10-CM | POA: Diagnosis not present

## 2023-12-23 DIAGNOSIS — L732 Hidradenitis suppurativa: Secondary | ICD-10-CM

## 2023-12-23 DIAGNOSIS — Z79899 Other long term (current) drug therapy: Secondary | ICD-10-CM

## 2023-12-23 DIAGNOSIS — G8929 Other chronic pain: Secondary | ICD-10-CM

## 2023-12-23 DIAGNOSIS — Z6833 Body mass index (BMI) 33.0-33.9, adult: Secondary | ICD-10-CM

## 2023-12-23 DIAGNOSIS — E6609 Other obesity due to excess calories: Secondary | ICD-10-CM

## 2023-12-23 DIAGNOSIS — N1832 Chronic kidney disease, stage 3b: Secondary | ICD-10-CM

## 2023-12-23 DIAGNOSIS — L309 Dermatitis, unspecified: Secondary | ICD-10-CM

## 2023-12-23 DIAGNOSIS — R9082 White matter disease, unspecified: Secondary | ICD-10-CM

## 2023-12-23 LAB — POCT GLYCOSYLATED HEMOGLOBIN (HGB A1C): Hemoglobin A1C: 5.8 % — AB (ref 4.0–5.6)

## 2023-12-23 MED ORDER — CLINDAMYCIN PHOSPHATE 1 % EX SOLN: Freq: Two times a day (BID) | CUTANEOUS | 2 refills | Status: AC

## 2023-12-23 MED ORDER — ESCITALOPRAM OXALATE 20 MG PO TABS: 20.0000 mg | ORAL_TABLET | Freq: Every day | ORAL | 3 refills | Status: DC

## 2023-12-23 MED ORDER — OZEMPIC (2 MG/DOSE) 8 MG/3ML ~~LOC~~ SOPN: PEN_INJECTOR | SUBCUTANEOUS | 0 refills | Status: DC

## 2023-12-23 MED ORDER — TRIAMCINOLONE ACETONIDE 0.1 % EX CREA: 1.0000 | TOPICAL_CREAM | Freq: Two times a day (BID) | CUTANEOUS | 0 refills | Status: AC

## 2023-12-23 MED ORDER — SPIRONOLACTONE 25 MG PO TABS: 25.0000 mg | ORAL_TABLET | Freq: Every day | ORAL | 1 refills | Status: DC

## 2023-12-23 MED ORDER — TOUJEO MAX SOLOSTAR 300 UNIT/ML ~~LOC~~ SOPN: 16.0000 [IU] | PEN_INJECTOR | Freq: Every day | SUBCUTANEOUS | 1 refills | Status: DC

## 2023-12-23 NOTE — Progress Notes (Signed)
 Established Patient Office Visit  Subjective   Patient ID: Nina Trujillo, female    DOB: [birth year]  Age: 58 y.o. MRN: [id number]  No chief complaint on file.   HPI Pt is a 58 yo female with T2DM, HTN, HLD who presents to the clinic for 3 month follow up.   Pt right now is not checking her sugars regularly. She is trying to be active. She does not exercise but she takes her grandkids to practice and tries to stay active.   She continues to have problems with brain fog and memory. She would like a new referral for her demyelinating disease. She feels like she needs more answers. She previously seen Dr. Sherita.   She has some eczema on hands. Much better currently.   Her mood is good. No SI/HC. She is taking lexapro  daily.   HS continues to flare from time to time. She is on spironolactone  for prevention right now. She is using the clindamycin  solution she seems to be heping some. She continues to smoke which is making it worse.    ROS See HPI.    Objective:     BP 124/75   Pulse 74   Ht 5' 7 (1.702 m)   Wt 215 lb (97.5 kg)   SpO2 99%   BMI 33.67 kg/m  BP Readings from Last 3 Encounters:  12/23/23 124/75  09/21/23 132/81  09/20/23 (!) 164/69   Wt Readings from Last 3 Encounters:  12/23/23 215 lb (97.5 kg)  09/21/23 209 lb (94.8 kg)  08/08/23 209 lb 8 oz (95 kg)    .SABRA Lab Results  Component Value Date   HGBA1C 5.8 (A) 12/23/2023     Physical Exam Constitutional:      Appearance: Normal appearance. She is obese.  HENT:     Head: Normocephalic.  Cardiovascular:     Rate and Rhythm: Normal rate and regular rhythm.  Pulmonary:     Effort: Pulmonary effort is normal.     Breath sounds: Normal breath sounds.  Neurological:     General: No focal deficit present.     Mental Status: She is alert and oriented to person, place, and time.  Psychiatric:        Mood and Affect: Mood normal.         Assessment & Plan:  SABRASABRADiagnoses and all orders for  this visit:  Type 2 diabetes mellitus with stage 3b chronic kidney disease, without long-term current use of insulin  (HCC) -     POCT HgB A1C -     BMP8+eGFR -     insulin  glargine, 2 Unit Dial , (TOUJEO  MAX SOLOSTAR) 300 UNIT/ML Solostar Pen; Inject 16 Units into the skin at bedtime. -     Semaglutide , 2 MG/DOSE, (OZEMPIC , 2 MG/DOSE,) 8 MG/3ML SOPN; INJECT 2 MG SUBCUTANEOUSLY ONCE A WEEK  Brain fog -     BMP8+eGFR -     VITAMIN D  25 Hydroxy (Vit-D Deficiency, Fractures) -     B12 and Folate Panel -     Sed Rate (ESR) -     C-reactive protein -     Ambulatory referral to Neurology  Demyelinating disease of central nervous system (HCC) -     BMP8+eGFR -     VITAMIN D  25 Hydroxy (Vit-D Deficiency, Fractures) -     B12 and Folate Panel -     Sed Rate (ESR) -     C-reactive protein -     Ambulatory referral to  Neurology  White matter abnormality on MRI of brain -     BMP8+eGFR -     VITAMIN D  25 Hydroxy (Vit-D Deficiency, Fractures) -     B12 and Folate Panel -     Sed Rate (ESR) -     C-reactive protein -     Ambulatory referral to Neurology  Hidradenitis suppurativa -     BMP8+eGFR -     spironolactone  (ALDACTONE ) 25 MG tablet; Take 1 tablet (25 mg total) by mouth daily. -     clindamycin  (CLEOCIN  T) 1 % external solution; Apply topically 2 (two) times daily. -     VITAMIN D  25 Hydroxy (Vit-D Deficiency, Fractures) -     B12 and Folate Panel -     Sed Rate (ESR) -     C-reactive protein -     doxycycline  (VIBRA -TABS) 100 MG tablet; Take 1 tablet (100 mg total) by mouth 2 (two) times daily.  Medication management -     BMP8+eGFR -     VITAMIN D  25 Hydroxy (Vit-D Deficiency, Fractures) -     B12 and Folate Panel -     Sed Rate (ESR) -     C-reactive protein  Moderate recurrent major depression (HCC) -     escitalopram  (LEXAPRO ) 20 MG tablet; Take 1 tablet (20 mg total) by mouth daily. TAKE ONE TABLET (20 MG TOTAL) BY MOUTH DAILY.  Class 1 obesity due to excess calories  with serious comorbidity and body mass index (BMI) of 33.0 to 33.9 in adult -     Semaglutide , 2 MG/DOSE, (OZEMPIC , 2 MG/DOSE,) 8 MG/3ML SOPN; INJECT 2 MG SUBCUTANEOUSLY ONCE A WEEK  Hand eczema -     triamcinolone  cream (KENALOG ) 0.1 %; Apply 1 Application topically 2 (two) times daily.   A1C to goal Continue toujeo /ozempic  BP to goal On statin Foot and eye exam UTD Pt declines all vaccines today Follow up in 3 months  .SABRADiscussed low carb diet with 1500 calories and 80g of protein.  Exercising at least 150 minutes a week.  [employer] could be a Chief Technology Officer.    HS-clindamycin  solution does seem to be helpful. She does like to keep abx for oubreak of doxycyline on hands Continue spironolactone  for prevention  Triamcinolone  for hand eczema as needed Keep hands moisturized  Mood controlled, refilled lexapro   New referral for demyelinating disease and increased white matter on the brain    Return in about 3 months (around 03/24/2024).    Davide Risdon, PA-C

## 2023-12-23 NOTE — Patient Instructions (Addendum)
 Will refer to neurology Use triamcinolone  for eczema and dry skin on hands Continue to use clindaymicn external solutions as needed Continue spironolactone  for prevention

## 2023-12-27 ENCOUNTER — Encounter: Payer: Self-pay | Admitting: Physician Assistant

## 2023-12-27 ENCOUNTER — Ambulatory Visit: Payer: Self-pay | Admitting: Physician Assistant

## 2023-12-27 ENCOUNTER — Ambulatory Visit

## 2023-12-27 DIAGNOSIS — L732 Hidradenitis suppurativa: Secondary | ICD-10-CM

## 2023-12-27 LAB — C-REACTIVE PROTEIN

## 2023-12-27 LAB — BMP8+EGFR
BUN/Creatinine Ratio: 9 — ABNORMAL LOW (ref 12–28)
BUN: 12 mg/dL (ref 8–27)
CO2: 20 mmol/L (ref 20–29)
Calcium: 9.9 mg/dL (ref 8.7–10.3)
Chloride: 106 mmol/L (ref 96–106)
Creatinine, Ser: 1.29 mg/dL — ABNORMAL HIGH (ref 0.57–1.00)
Glucose: 112 mg/dL — ABNORMAL HIGH (ref 70–99)
Potassium: 4.8 mmol/L (ref 3.5–5.2)
Sodium: 140 mmol/L (ref 134–144)
eGFR: 48 mL/min/1.73 — ABNORMAL LOW (ref >59)

## 2023-12-27 LAB — VITAMIN D 25 HYDROXY (VIT D DEFICIENCY, FRACTURES): Vit D, 25-Hydroxy: 35.9 ng/mL (ref 30.0–100.0)

## 2023-12-27 LAB — B12 AND FOLATE PANEL
Folate: >20 ng/mL (ref >3.0)
Vitamin B-12: >2000 pg/mL — ABNORMAL HIGH (ref 232–1245)

## 2023-12-27 LAB — SEDIMENTATION RATE: Sed Rate: 4 mm/h (ref 0–40)
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
## 2023-12-27 NOTE — Progress Notes (Signed)
 Shantele,   Kidney function stable and improved a little.  B12 too elevated. Cut your dose in half of what you are taking.  Vitamin D  improved.  Normal inflammatory markers

## 2023-12-27 NOTE — Telephone Encounter (Signed)
 Please see message from patient above  Requesting rx rf of clindamycin  pills but in chart showing as clindamycin  external solution-last written 12/23/2023  Called patient - she states she is requesting the pills for a boil that she has coming on .   And spironolactone  last written 12/23/2023- so should have refills of this at pharmacy  - I did et her know about this.

## 2023-12-28 ENCOUNTER — Encounter: Payer: Self-pay | Admitting: Physician Assistant

## 2023-12-28 DIAGNOSIS — L309 Dermatitis, unspecified: Secondary | ICD-10-CM | POA: Insufficient documentation

## 2023-12-28 MED ORDER — DOXYCYCLINE HYCLATE 100 MG PO TABS: 100.0000 mg | ORAL_TABLET | Freq: Two times a day (BID) | ORAL | 0 refills | Status: DC

## 2023-12-28 NOTE — Telephone Encounter (Signed)
 Attempted call to patient. Left a voice mail message requesting a return call.

## 2023-12-28 NOTE — Telephone Encounter (Signed)
 I sent doxycycline  but the other 2 rx were already sent and should be at pharmacy.

## 2023-12-29 ENCOUNTER — Ambulatory Visit

## 2023-12-29 DIAGNOSIS — M542 Cervicalgia: Secondary | ICD-10-CM | POA: Diagnosis not present

## 2023-12-29 DIAGNOSIS — M79602 Pain in left arm: Secondary | ICD-10-CM

## 2023-12-29 DIAGNOSIS — M5459 Other low back pain: Secondary | ICD-10-CM | POA: Diagnosis not present

## 2023-12-29 DIAGNOSIS — M6281 Muscle weakness (generalized): Secondary | ICD-10-CM

## 2023-12-29 DIAGNOSIS — M25552 Pain in left hip: Secondary | ICD-10-CM
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
## 2024-01-05 DIAGNOSIS — M5416 Radiculopathy, lumbar region: Secondary | ICD-10-CM | POA: Diagnosis not present

## 2024-01-05 DIAGNOSIS — M5442 Lumbago with sciatica, left side: Secondary | ICD-10-CM | POA: Diagnosis not present

## 2024-01-05 DIAGNOSIS — G8929 Other chronic pain: Secondary | ICD-10-CM | POA: Diagnosis not present

## 2024-01-08 ENCOUNTER — Other Ambulatory Visit: Payer: Self-pay | Admitting: Physician Assistant

## 2024-01-08 DIAGNOSIS — E1122 Type 2 diabetes mellitus with diabetic chronic kidney disease: Secondary | ICD-10-CM

## 2024-01-10 ENCOUNTER — Ambulatory Visit

## 2024-01-10 NOTE — Progress Notes (Deleted)
   Subjective:    Patient ID: Nina Trujillo, female    DOB: 58 y.o., Oct 09, 1965   MRN: [id number]  HPI  Chief Complaint: Left hip pain  Former patient of Dr. ONEIDA. Previously evaluated by Dr. Trudy with pain management recommended left SI joint injection if fails lumbar spine MRI and left L5-S1 transforaminal epidural. On Lyrica. Started PT at last visit w/ Dr. ONEIDA on 7/17. Prior MRI w/ cystic changes in the hip joint Consider joint injection if symptoms persistent.     Objective:   Physical Exam There were no vitals filed for this visit.  ***       Assessment & Plan:   ***

## 2024-01-11 ENCOUNTER — Other Ambulatory Visit: Payer: Self-pay | Admitting: Physician Assistant

## 2024-01-11 DIAGNOSIS — F331 Major depressive disorder, recurrent, moderate: Secondary | ICD-10-CM

## 2024-01-12 ENCOUNTER — Ambulatory Visit: Admitting: Sports Medicine

## 2024-01-17 ENCOUNTER — Encounter: Payer: Self-pay | Admitting: Sports Medicine

## 2024-01-19 ENCOUNTER — Other Ambulatory Visit: Payer: Self-pay | Admitting: Physician Assistant

## 2024-01-19 DIAGNOSIS — F331 Major depressive disorder, recurrent, moderate: Secondary | ICD-10-CM

## 2024-01-19 NOTE — Telephone Encounter (Unsigned)
 Copied from CRM [id number]. Topic: Clinical - Medication Refill >> Jan 19, 2024 12:49 PM Kevelyn M wrote: Medication: escitalopram  (LEXAPRO ) 20 MG tablet  Has the patient contacted their pharmacy? Yes (Agent: If no, request that the patient contact the pharmacy for the refill. If patient does not wish to contact the pharmacy document the reason why and proceed with request.) (Agent: If yes, when and what did the pharmacy advise?)  This is the patient's preferred pharmacy:  [facility] [place] [address] [place] [address] Phone: [phone] Fax: [phone]  Is this the correct pharmacy for this prescription? Yes If no, delete pharmacy and type the correct one.   Has the prescription been filled recently? No  Is the patient out of the medication? Yes  Has the patient been seen for an appointment in the last year OR does the patient have an upcoming appointment? Yes  Can we respond through MyChart? No  Agent: Please be advised that Rx refills may take up to 3 business days. We ask that you follow-up with your pharmacy.

## 2024-01-20 MED ORDER — ESCITALOPRAM OXALATE 20 MG PO TABS: 20.0000 mg | ORAL_TABLET | Freq: Every day | ORAL | 3 refills | Status: DC

## 2024-01-23 ENCOUNTER — Other Ambulatory Visit: Payer: Self-pay | Admitting: Physician Assistant

## 2024-01-23 DIAGNOSIS — N951 Menopausal and female climacteric states: Secondary | ICD-10-CM

## 2024-01-23 DIAGNOSIS — F331 Major depressive disorder, recurrent, moderate: Secondary | ICD-10-CM

## 2024-01-26 DIAGNOSIS — Z79899 Other long term (current) drug therapy: Secondary | ICD-10-CM | POA: Diagnosis not present

## 2024-01-26 DIAGNOSIS — M5416 Radiculopathy, lumbar region: Secondary | ICD-10-CM | POA: Diagnosis not present

## 2024-01-30 ENCOUNTER — Other Ambulatory Visit: Payer: Self-pay | Admitting: Physician Assistant

## 2024-01-30 DIAGNOSIS — E6609 Other obesity due to excess calories: Secondary | ICD-10-CM

## 2024-01-30 DIAGNOSIS — M25512 Pain in left shoulder: Secondary | ICD-10-CM

## 2024-01-30 DIAGNOSIS — F331 Major depressive disorder, recurrent, moderate: Secondary | ICD-10-CM

## 2024-01-30 DIAGNOSIS — N1832 Chronic kidney disease, stage 3b: Secondary | ICD-10-CM

## 2024-01-30 DIAGNOSIS — M5030 Other cervical disc degeneration, unspecified cervical region: Secondary | ICD-10-CM

## 2024-01-30 DIAGNOSIS — R251 Tremor, unspecified: Secondary | ICD-10-CM

## 2024-01-30 DIAGNOSIS — M5412 Radiculopathy, cervical region: Secondary | ICD-10-CM

## 2024-01-30 DIAGNOSIS — M25552 Pain in left hip: Secondary | ICD-10-CM

## 2024-01-30 NOTE — Telephone Encounter (Unsigned)
 Copied from CRM [id number]. Topic: Clinical - Medication Refill >> Jan 30, 2024  3:29 PM Alfonso ORN wrote: Medication:,Semaglutide , 2 MG/DOSE, (OZEMPIC , 2 MG/DOSE,) 8 MG/3ML SOPN escitalopram  (LEXAPRO ) 20 MG tablet,omeprazole  (PRILOSEC) 40 MG capsule cyclobenzaprine  (FLEXERIL ) 10 MG tablet        Has the patient contacted their pharmacy? No,pharmacy calling on pt. Behalf  (Agent: If no, request that the patient contact the pharmacy for the refill. If patient does not wish to contact the pharmacy document the reason why and proceed with request.) (Agent: If yes, when and what did the pharmacy advise?)  This is the patient's preferred pharmacy:   [facility] [place] [address] [place] [address] Phone: [phone] Fax: [phone]  Is this the correct pharmacy for this prescription? Yes If no, delete pharmacy and type the correct one.   Has the prescription been filled recently? No  Is the patient out of the medication? No  Has the patient been seen for an appointment in the last year OR does the patient have an upcoming appointment? Yes  Can we respond through MyChart? No  Agent: Please be advised that Rx refills may take up to 3 business days. We ask that you follow-up with your pharmacy.

## 2024-01-31 MED ORDER — CYCLOBENZAPRINE HCL 10 MG PO TABS: 10.0000 mg | ORAL_TABLET | Freq: Three times a day (TID) | ORAL | 1 refills | Status: DC | PRN

## 2024-01-31 MED ORDER — ESCITALOPRAM OXALATE 20 MG PO TABS: 20.0000 mg | ORAL_TABLET | Freq: Every day | ORAL | 4 refills | Status: AC

## 2024-01-31 MED ORDER — OZEMPIC (2 MG/DOSE) 8 MG/3ML ~~LOC~~ SOPN: PEN_INJECTOR | SUBCUTANEOUS | 0 refills | Status: DC

## 2024-02-02 ENCOUNTER — Other Ambulatory Visit: Payer: Self-pay | Admitting: Physician Assistant

## 2024-02-02 DIAGNOSIS — F331 Major depressive disorder, recurrent, moderate: Secondary | ICD-10-CM

## 2024-02-02 NOTE — Telephone Encounter (Signed)
 Copied from CRM [id number]. Topic: Clinical - Medication Refill >> Feb 02, 2024  2:18 PM Cherylann S wrote: Medication: escitalopram  (LEXAPRO ) 20 MG tablet   Has the patient contacted their pharmacy? Yes (Agent: If no, request that the patient contact the pharmacy for the refill. If patient does not wish to contact the pharmacy document the reason why and proceed with request.) (Agent: If yes, when and what did the pharmacy advise?)  This is the patient's preferred pharmacy:  [facility] [place] [address] [place] [address] Phone: [phone] Fax: [phone]  Is this the correct pharmacy for this prescription? Yes If no, delete pharmacy and type the correct one.   Has the prescription been filled recently? No  Is the patient out of the medication? Yes  Has the patient been seen for an appointment in the last year OR does the patient have an upcoming appointment? Yes  Can we respond through MyChart? Yes  Agent: Please be advised that Rx refills may take up to 3 business days. We ask that you follow-up with your pharmacy.

## 2024-02-15 ENCOUNTER — Other Ambulatory Visit: Payer: Self-pay | Admitting: Neurology

## 2024-02-15 NOTE — Telephone Encounter (Signed)
 Last seen on 08/08/23 No follow up scheduled  PCP last checked Folic acid  levels, if Rx is needed PCP can provide refill.

## 2024-02-21 ENCOUNTER — Encounter: Payer: Self-pay | Admitting: Physician Assistant

## 2024-02-22 ENCOUNTER — Ambulatory Visit (INDEPENDENT_AMBULATORY_CARE_PROVIDER_SITE_OTHER): Admitting: Physician Assistant

## 2024-02-22 ENCOUNTER — Telehealth: Payer: Self-pay | Admitting: Neurology

## 2024-02-22 VITALS — BP 127/68 | HR 103 | Temp 98.7°F

## 2024-02-22 DIAGNOSIS — N6082 Other benign mammary dysplasias of left breast: Secondary | ICD-10-CM | POA: Diagnosis not present

## 2024-02-22 DIAGNOSIS — N61 Mastitis without abscess: Secondary | ICD-10-CM

## 2024-02-22 MED ORDER — HYDROCODONE-ACETAMINOPHEN 5-325 MG PO TABS: 1.0000 | ORAL_TABLET | Freq: Three times a day (TID) | ORAL | 0 refills | Status: AC | PRN

## 2024-02-22 MED ORDER — DOXYCYCLINE HYCLATE 100 MG PO TABS: 100.0000 mg | ORAL_TABLET | Freq: Two times a day (BID) | ORAL | 0 refills | Status: DC

## 2024-02-22 MED ORDER — HYDROCODONE-ACETAMINOPHEN 5-325 MG PO TABS: 1.0000 | ORAL_TABLET | Freq: Three times a day (TID) | ORAL | 0 refills | Status: DC | PRN

## 2024-02-22 NOTE — Telephone Encounter (Signed)
 Rx was filled on 12/08/23 90 day supply. Pt should have enough refills until the end of Oct.  I called pt to relay this and she was currently at a doctors appointment. She will check her medication supply when she gets home.

## 2024-02-22 NOTE — Telephone Encounter (Signed)
 Pt's pharmacy called needing a Rx refill request for the pt's Folic Acid -Vit B6-Vit B12 (FOLBEE) 2.5-25-1 MG TABS tablet  Please send to [facility]

## 2024-02-22 NOTE — Progress Notes (Signed)
   Acute Office Visit  Subjective:     Patient ID: Nina Trujillo, female    DOB: 09/14/1965, 58 y.o.   MRN: [id number]  Chief Complaint  Patient presents with   Medical Management of Chronic Issues    HPI Patient is in today for ***  ROS      Objective:    There were no vitals taken for this visit. {Vitals History (Optional):23777}  Physical Exam  No results found for any visits on 02/22/24.      Assessment & Plan:   Problem List Items Addressed This Visit   None   No orders of the defined types were placed in this encounter.   No follow-ups on file.  Riker Collier, PA-C

## 2024-02-22 NOTE — Patient Instructions (Signed)
 Start doxycycline  and continue warm compresses.  Mammogram to be ordered.  Cellulitis, Adult  Cellulitis is a skin infection. The infected area is often warm, red, swollen, and sore. It occurs most often on the legs, feet, and toes, but can happen on any part of the body. This condition can be life-threatening without treatment. It is very important to get treated right away. What are the causes? This condition is caused by bacteria. The bacteria enter through a break in the skin, such as: A cut. A burn. A bug bite. An animal bite. An open sore. A crack. What increases the risk? Having a weak body's defense system (immune system). Being older than 60 years old. Having a blood sugar problem (diabetes). Having a long-term liver disease (cirrhosis) or kidney disease. Being very overweight (obese). Having a skin problem, such as: An itchy rash. A rash caused by a fungus. A rash with blisters. Slow movement of blood in the veins (venous stasis). Fluid buildup below the skin (edema). This condition is more likely to occur in people who: Have open cuts, burns, bites, or scrapes on the skin. Have been treated with high-energy rays (radiation). Use IV drugs. What are the signs or symptoms? Skin that: Looks red or purple, or slightly darker than your usual skin color. Has streaks. Has spots. Is swollen. Is sore or painful when you touch it. Is warm. A fever. Chills. Blisters. Tiredness (fatigue). How is this treated? Medicines to treat infections or allergies. Rest. Placing cold or warm cloths on the skin. Staying in the hospital, if the condition is very bad. You may need medicines through an IV. Follow these instructions at home: Medicines Take over-the-counter and prescription medicines only as told by your doctor. If you were prescribed antibiotics, take them as told by your doctor. Do not stop using them even if you start to feel better. General instructions Drink  enough fluid to keep your pee (urine) pale yellow. Do not touch or rub the infected area. Raise (elevate) the infected area above the level of your heart while you are sitting or lying down. Return to your normal activities when your doctor says that it is safe. Place cold or warm cloths on the area as told by your doctor. Keep all follow-up visits. Your doctor will need to make sure that a more serious infection is not developing. Contact a doctor if: You have a fever. You do not start to get better after 1-2 days of treatment. Your bone or joint under the infected area starts to hurt after the skin has healed. Your infection comes back in the same area or another area. Signs of this may include: You have a swollen bump in the area. Your red area gets larger, turns dark in color, or hurts more. You have more fluid coming from the wound. Pus or a bad smell develops in your infected area. You have more pain. You feel sick and have muscle aches and weakness. You develop vomiting or watery poop that will not go away. Get help right away if: You see red streaks coming from the area. You notice the skin turns purple or black and falls off. These symptoms may be an emergency. Get help right away. Call 911. Do not wait to see if the symptoms will go away. Do not drive yourself to the hospital. This information is not intended to replace advice given to you by your health care provider. Make sure you discuss any questions you have with your health  care provider. Document Revised: 12/29/2021 Document Reviewed: 12/29/2021 Elsevier Patient Education  2024 [employer].

## 2024-02-24 ENCOUNTER — Encounter: Payer: Self-pay | Admitting: Physician Assistant

## 2024-02-28 ENCOUNTER — Encounter: Payer: Self-pay | Admitting: *Deleted

## 2024-03-01 ENCOUNTER — Ambulatory Visit: Payer: Self-pay

## 2024-03-01 NOTE — Telephone Encounter (Signed)
 FYI Only or Action Required?: FYI only for provider.  Patient was last seen in primary care on 02/22/2024 by Antoniette Vermell CROME, PA-C.  Called Nurse Triage reporting Blister.  Symptoms began several days ago.  Interventions attempted: Rest, hydration, or home remedies.  Symptoms are: rapidly worsening.  Triage Disposition: See Physician Within 24 Hours  Patient/caregiver understands and will follow disposition?: Yes  Copied from CRM #[id number]. Topic: Clinical - Medical Advice >> Mar 01, 2024 12:37 PM Darshell M wrote: Reason for CRM: Patient saw provider for boil this week. Since then the boil has burst and is  bigger and more painful. CB# [id number] Reason for Disposition  Looks like a boil or deep ulcer  Answer Assessment - Initial Assessment Questions A week ago noticed a boil. Appeared to under the Left breast near the areola. States it started out as a boil and then burst a day ago and now is severely painful and red. States it looks like theres a white film over it. Approx the size of quarter.   1. APPEARANCE of SORES: What do the sores look like?     Open; red; purple; angry; white film 2. NUMBER: How many sores are there?     one 3. SIZE: How big is the largest sore?     About a quarter size 4. LOCATION: Where are the sores located?     Under the Left breast near the areola 5. ONSET: When did the sores begin?     Last week 6. TENDER: Does it hurt when you touch it?  (Scale 1-10; or mild, moderate, severe)      10/10 7. CAUSE: What do you think is causing the sores?     Started from a boil 8. OTHER SYMPTOMS: Do you have any other symptoms? (e.g., fever, new weakness)     denies  Protocols used: Sores-A-AH

## 2024-03-01 NOTE — Telephone Encounter (Signed)
 Patient scheduled tomorrow 10/17/225 with vermell Bologna, PA

## 2024-03-02 ENCOUNTER — Ambulatory Visit (INDEPENDENT_AMBULATORY_CARE_PROVIDER_SITE_OTHER): Admitting: Physician Assistant

## 2024-03-02 ENCOUNTER — Encounter: Payer: Self-pay | Admitting: Physician Assistant

## 2024-03-02 VITALS — BP 107/72 | HR 85 | Temp 98.2°F

## 2024-03-02 DIAGNOSIS — S21002D Unspecified open wound of left breast, subsequent encounter: Secondary | ICD-10-CM | POA: Insufficient documentation

## 2024-03-02 MED ORDER — HYDROCODONE-ACETAMINOPHEN 5-325 MG PO TABS: 1.0000 | ORAL_TABLET | Freq: Four times a day (QID) | ORAL | 0 refills | Status: AC | PRN

## 2024-03-02 MED ORDER — HYDROCODONE-ACETAMINOPHEN 5-325 MG PO TABS: 1.0000 | ORAL_TABLET | Freq: Four times a day (QID) | ORAL | 0 refills | Status: DC | PRN

## 2024-03-02 MED ORDER — SULFAMETHOXAZOLE-TRIMETHOPRIM 800-160 MG PO TABS: 1.0000 | ORAL_TABLET | Freq: Two times a day (BID) | ORAL | 0 refills | Status: DC

## 2024-03-02 NOTE — Patient Instructions (Signed)
 Start new antibiotic. BACtrim Sent off for wound culture. Warm compresses

## 2024-03-02 NOTE — Progress Notes (Unsigned)
 Acute Office Visit  Subjective:     Patient ID: Nina Trujillo, female    DOB: 1965-10-04, 58 y.o.   MRN: [id number]  Chief Complaint  Patient presents with   Wound Infection    Possible MRSA infection left breast area onset for  1 day pt states her wound has not closed     Patient is a 58 yo female presenting with a possible infected wound of her left breast. Pt was seen in the clinic on 10/08 for a mass and cellulitis of the left breast and was prescribed Doxycycline  for infection. She states the mass ruptured on the morning of 10/13 while she was asleep. She describes the content of the mass to be malodorous and thick, bloody with some pus. She states the area is painful to touch being a stinging sensation 10/10 on the pain scale. Today her pain is ranked as a 4/10. Pt has kept the area covered with a bandage and thin layer of Neosporin. Pt has been taking Vicodin for pain relief. She endorses bleeding of the area, tenderness, and swelling. No fever, chills, or other systemic symptoms noted at this time.   Review of Systems  Constitutional:  Negative for chills, diaphoresis and fever.  Skin:        Skin of the breast is disrupted and warm to the touch.   All other systems reviewed and are negative.       Objective:    BP 107/72 (Cuff Size: Normal)   Pulse 85   Temp 98.2 F (36.8 C)   SpO2 96%  {Vitals History (Optional):23777}  Physical Exam Exam conducted with a chaperone present (Wound present below the nipple of the left breast. Wound is erythematous with pus and blood present within the central opening. Area around the wound is hardened with some discoloration. Skin of the breast has began to peel from swelling within the area.).  Constitutional:      Appearance: Normal appearance. She is obese.  HENT:     Head: Normocephalic and atraumatic.  Pulmonary:     Effort: Pulmonary effort is normal.  Chest:  Breasts:    Right: Normal.     Left: Swelling, bleeding,  skin change and tenderness present.  Neurological:     Mental Status: She is alert.     No results found for any visits on 03/02/24.      Assessment & Plan:   Problem List Items Addressed This Visit       Other   Breast wound, left, subsequent encounter - Primary   Wound culture sent off for testing and will contact when resulted  Suspicious of possible MRSA or Streptococcus infection of the wound due to localized symptoms. Discontinue Doxycycline  and start Bactrim for empiric treatment  Cover wound with petrolatum bandages with non adhesive bandage to protect the area. Take Norco every six hours as needed for pain but no more than 5 days.   Please return if fever, flu-like symptoms, or worsening symptoms occur.   Meds ordered this encounter  Medications   sulfamethoxazole-trimethoprim (BACTRIM DS) 800-160 MG tablet    Sig: Take 1 tablet by mouth 2 (two) times daily.    Dispense:  20 tablet    Refill:  0    Supervising Provider:   METHENEY, CATHERINE D [[id number]]   DISCONTD: HYDROcodone-acetaminophen  (NORCO/VICODIN) 5-325 MG tablet    Sig: Take 1 tablet by mouth every 6 (six) hours as needed for up to 5 days for moderate pain (  pain score 4-6).    Dispense:  21 tablet    Refill:  0    Supervising Provider:   METHENEY, CATHERINE D [[id number]]   HYDROcodone-acetaminophen  (NORCO/VICODIN) 5-325 MG tablet    Sig: Take 1 tablet by mouth every 6 (six) hours as needed for up to 5 days for moderate pain (pain score 4-6).    Dispense:  21 tablet    Refill:  0    Supervising Provider:   METHENEY, CATHERINE D [[id number]]    No follow-ups on file.  Destine K Riggins, Student-PA

## 2024-03-05 ENCOUNTER — Encounter: Payer: Self-pay | Admitting: Physician Assistant

## 2024-03-07 ENCOUNTER — Other Ambulatory Visit: Payer: Self-pay | Admitting: Neurology

## 2024-03-07 ENCOUNTER — Ambulatory Visit: Payer: Self-pay | Admitting: Physician Assistant

## 2024-03-07 LAB — WOUND CULTURE: Organism ID, Bacteria: NONE SEEN

## 2024-03-07 NOTE — Telephone Encounter (Signed)
 Last seen on 08/08/23 No 6 month follow up scheduled   Pt needs to scheduled a follow up appointment

## 2024-03-07 NOTE — Progress Notes (Signed)
 Proteus mirabilis grew but bactrim the last antibiotic should treat it. How are you feeling?

## 2024-03-12 DIAGNOSIS — M62830 Muscle spasm of back: Secondary | ICD-10-CM | POA: Diagnosis not present

## 2024-03-12 DIAGNOSIS — M5442 Lumbago with sciatica, left side: Secondary | ICD-10-CM | POA: Diagnosis not present

## 2024-03-12 DIAGNOSIS — Z79899 Other long term (current) drug therapy: Secondary | ICD-10-CM | POA: Diagnosis not present

## 2024-03-12 DIAGNOSIS — G8929 Other chronic pain: Secondary | ICD-10-CM | POA: Diagnosis not present

## 2024-03-14 ENCOUNTER — Ambulatory Visit: Admitting: Physician Assistant

## 2024-03-14 VITALS — BP 117/81 | HR 71 | Ht 67.0 in | Wt 215.0 lb

## 2024-03-14 DIAGNOSIS — Z7984 Long term (current) use of oral hypoglycemic drugs: Secondary | ICD-10-CM

## 2024-03-14 DIAGNOSIS — E11628 Type 2 diabetes mellitus with other skin complications: Secondary | ICD-10-CM | POA: Diagnosis not present

## 2024-03-14 DIAGNOSIS — S21002D Unspecified open wound of left breast, subsequent encounter: Secondary | ICD-10-CM | POA: Diagnosis not present

## 2024-03-14 DIAGNOSIS — Z7985 Long-term (current) use of injectable non-insulin antidiabetic drugs: Secondary | ICD-10-CM | POA: Diagnosis not present

## 2024-03-14 NOTE — Progress Notes (Unsigned)
   Established Patient Office Visit  Subjective   Patient ID: Nina Trujillo, female    DOB: 04-10-1966  Age: 58 y.o. MRN: [id number]  Chief Complaint  Patient presents with   Wound Check    HPI .SABRADiscussed the use of AI scribe software for clinical note transcription with the patient, who gave verbal consent to proceed.  History of Present Illness Nina Trujillo is a 58 year old female with diabetes who presents with a healing skin infection.  Cutaneous infection and healing - Healing skin infection due to Proteus bacteria - Initial antibiotic therapy was ineffective; infection is now improving - Significant swelling and induration initially, now markedly improved - No fever or other systemic symptoms - Initial lesion described as 'humongous', causing concern  Pruritus and local symptoms - Persistent and significant pruritus in the affected area, described as 'driving crazy' - Swelling remains present but is improving - Itching is common during the healing process  Skin sensitivity and wound care - History of skin sensitivity to tape and band-aids - Uses a bra to secure the area and avoid irritation - Applies Vaseline to the area to aid healing - Gauze with Vaseline helps alleviate itchiness - Cool compresses provide symptomatic relief - Cautious about using topical steroids near the wound  Diabetes and impaired healing - Diabetes may be contributing to delayed wound healing    ROS See HPI.    Objective:     BP 117/81   Pulse 71   Ht 5' 7 (1.702 m)   Wt 215 lb (97.5 kg)   SpO2 99%   BMI 33.67 kg/m  BP Readings from Last 3 Encounters:  03/14/24 117/81  03/02/24 107/72  02/24/24 127/68   Wt Readings from Last 3 Encounters:  03/14/24 215 lb (97.5 kg)  12/23/23 215 lb (97.5 kg)  09/21/23 209 lb (94.8 kg)      Physical Exam Left breast wound below the nipple in the lower outer quadrant. Wound is 1cm by 1cm today with great granulation  tissue and minimal drainage. Surrounding tissue is indurated about 1cm out from wound but not warm to touch. Swelling and tenderness has decreased significantly.     Assessment & Plan:  SABRASABRANomie was seen today for wound check.  Diagnoses and all orders for this visit:  Breast wound, left, subsequent encounter   Assessment & Plan Open wound of left breast Healing well with residual itching and induration. Initial infection with protease-resistant bacteria exacerbated by diabetes, now improved with reduced swelling and no infection. - Apply Vaseline to wound. - Use gauze with Vaseline for itchiness. - Consider cool compresses for itching relief. - Take Benadryl for itching if needed. - Apply triamcinolone  to skin around wound, avoiding open area.  Type 2 diabetes mellitus Contributing to delayed wound healing and lower immune response.     Return if symptoms worsen or fail to improve.    Mycah Formica, PA-C

## 2024-03-14 NOTE — Patient Instructions (Signed)
 Healing nicely. Finish antibiotic. Keep applying xeroform dressings until healed. Cool compresses can help with itch.

## 2024-03-16 ENCOUNTER — Encounter: Payer: Self-pay | Admitting: Physician Assistant

## 2024-03-16 ENCOUNTER — Other Ambulatory Visit: Payer: Self-pay | Admitting: Physician Assistant

## 2024-03-16 DIAGNOSIS — E1122 Type 2 diabetes mellitus with diabetic chronic kidney disease: Secondary | ICD-10-CM

## 2024-03-17 ENCOUNTER — Other Ambulatory Visit: Payer: Self-pay | Admitting: Physician Assistant

## 2024-03-17 DIAGNOSIS — M5412 Radiculopathy, cervical region: Secondary | ICD-10-CM

## 2024-03-17 DIAGNOSIS — M25552 Pain in left hip: Secondary | ICD-10-CM

## 2024-03-17 DIAGNOSIS — R251 Tremor, unspecified: Secondary | ICD-10-CM

## 2024-03-17 DIAGNOSIS — M5030 Other cervical disc degeneration, unspecified cervical region: Secondary | ICD-10-CM

## 2024-03-17 DIAGNOSIS — M25512 Pain in left shoulder: Secondary | ICD-10-CM

## 2024-03-20 ENCOUNTER — Other Ambulatory Visit: Payer: Self-pay | Admitting: Physician Assistant

## 2024-03-20 DIAGNOSIS — M5412 Radiculopathy, cervical region: Secondary | ICD-10-CM

## 2024-03-20 DIAGNOSIS — M25552 Pain in left hip: Secondary | ICD-10-CM

## 2024-03-20 DIAGNOSIS — M25512 Pain in left shoulder: Secondary | ICD-10-CM

## 2024-03-20 DIAGNOSIS — M5030 Other cervical disc degeneration, unspecified cervical region: Secondary | ICD-10-CM

## 2024-03-20 DIAGNOSIS — R9082 White matter disease, unspecified: Secondary | ICD-10-CM | POA: Diagnosis not present

## 2024-03-20 DIAGNOSIS — R251 Tremor, unspecified: Secondary | ICD-10-CM

## 2024-03-20 NOTE — Telephone Encounter (Unsigned)
 Copied from CRM [id number]. Topic: Clinical - Medication Refill >> Mar 20, 2024  5:11 PM Zebedee SAUNDERS wrote: Medication: cyclobenzaprine  (FLEXERIL ) 10 MG tablet  Has the patient contacted their pharmacy? Yes (Agent: If no, request that the patient contact the pharmacy for the refill. If patient does not wish to contact the pharmacy document the reason why and proceed with request.) (Agent: If yes, when and what did the pharmacy advise?)  This is the patient's preferred pharmacy:   [facility] [place] [place] [address] [place] [address] Phone: [phone] Fax: [phone]  Is this the correct pharmacy for this prescription? Yes If no, delete pharmacy and type the correct one.   Has the prescription been filled recently? Yes  Is the patient out of the medication? Yes  Has the patient been seen for an appointment in the last year OR does the patient have an upcoming appointment? Yes  Can we respond through MyChart? Yes  Agent: Please be advised that Rx refills may take up to 3 business days. We ask that you follow-up with your pharmacy.

## 2024-03-26 ENCOUNTER — Ambulatory Visit: Admitting: Physician Assistant

## 2024-03-26 VITALS — BP 107/82 | HR 90 | Ht 67.0 in | Wt 211.0 lb

## 2024-03-26 DIAGNOSIS — M79604 Pain in right leg: Secondary | ICD-10-CM

## 2024-03-26 DIAGNOSIS — Z7984 Long term (current) use of oral hypoglycemic drugs: Secondary | ICD-10-CM

## 2024-03-26 DIAGNOSIS — M5416 Radiculopathy, lumbar region: Secondary | ICD-10-CM | POA: Diagnosis not present

## 2024-03-26 DIAGNOSIS — Z7985 Long-term (current) use of injectable non-insulin antidiabetic drugs: Secondary | ICD-10-CM | POA: Diagnosis not present

## 2024-03-26 DIAGNOSIS — E1122 Type 2 diabetes mellitus with diabetic chronic kidney disease: Secondary | ICD-10-CM

## 2024-03-26 DIAGNOSIS — M79605 Pain in left leg: Secondary | ICD-10-CM | POA: Diagnosis not present

## 2024-03-26 DIAGNOSIS — N1832 Chronic kidney disease, stage 3b: Secondary | ICD-10-CM

## 2024-03-26 LAB — POCT GLYCOSYLATED HEMOGLOBIN (HGB A1C): Hemoglobin A1C: 6.3 % — AB (ref 4.0–5.6)

## 2024-03-26 NOTE — Progress Notes (Unsigned)
 Established Patient Office Visit  Subjective   Patient ID: Nina Trujillo, female    DOB: 1966-04-02  Age: 58 y.o. MRN: [id number]  Chief Complaint  Patient presents with   Medical Management of Chronic Issues    HPI Discussed the use of AI scribe software for clinical note transcription with the patient, who gave verbal consent to proceed.  History of Present Illness Nina Trujillo is a 58 year old female who presents to the clinic for 3 month follow up on T2DM.   Glycemic control and diabetes management - Recent increase in hemoglobin A1c to 6.3, attributed to recent infections - Diabetes management includes 16 units of Toujeo  at bedtime, metformin  1000mg  bid, Ozempic  2mg  weekly - Dexcom continuous glucose monitor is not currently in use - Reduced exercise frequency, with plans to start physical therapy - no hypoglycemic events  Lower extremity paresthesia and sensory changes - Numbness and tingling in both legs, more pronounced in the left leg - No pain upon touch - Sensation in legs is difficult to articulate - she does report worsening with walking and at times feels like cramping  Lower back pain and fall - Fall last week after stepping out of bed, associated with radiating pain from the back - Numbness and tingling primarily in the left leg following the fall - MRI of the back recently ordered by pain management specialist - Received injections in the lower back from pain management  Pruritus of left breast over wound healing - Persistent itching in certain areas - Symptom managed with topical steroid cream, which is effective - Requests refill of topical cream    ROS See HPI>    Objective:     BP 107/82   Pulse 90   Ht 5' 7 (1.702 m)   Wt 211 lb (95.7 kg)   SpO2 99%   BMI 33.05 kg/m  BP Readings from Last 3 Encounters:  03/26/24 107/82  03/14/24 117/81  03/02/24 107/72   Wt Readings from Last 3 Encounters:  03/26/24 211 lb (95.7  kg)  03/14/24 215 lb (97.5 kg)  12/23/23 215 lb (97.5 kg)      Physical Exam Constitutional:      Appearance: Normal appearance. She is obese.  HENT:     Head: Normocephalic.  Cardiovascular:     Rate and Rhythm: Normal rate and regular rhythm.     Heart sounds: Murmur heard.  Pulmonary:     Effort: Pulmonary effort is normal.     Breath sounds: Normal breath sounds.  Musculoskeletal:     Right lower leg: No edema.     Left lower leg: No edema.     Comments: 1+ pedal pulses bilaterally.   Skin:    Comments: Resolved wound of left breast.   Neurological:     General: No focal deficit present.     Mental Status: She is alert and oriented to person, place, and time.  Psychiatric:        Mood and Affect: Mood normal.      Results for orders placed or performed in visit on 03/26/24  POCT HgB A1C  Result Value Ref Range   Hemoglobin A1C 6.3 (A) 4.0 - 5.6 %   HbA1c POC (<> result, manual entry)     HbA1c, POC (prediabetic range)     HbA1c, POC (controlled diabetic range)      Assessment & Plan:  [name] was seen today for medical management of chronic issues.  Diagnoses and all  orders for this visit:  Type 2 diabetes mellitus with stage 3b chronic kidney disease, without long-term current use of insulin  (HCC) -     POCT HgB A1C  Left lumbar radiculitis  Bilateral leg pain -     VAS US  ABI WITH/WO TBI; Future   Assessment & Plan Type 2 diabetes mellitus A1c increased to 6.3, likely due to recent infection and inflammation. Blood pressure well-controlled. Not using Dexcom due to insurance issues. - Encouraged use of Dexcom continuous glucose monitor. - Continue Toujeo  16 units at bedtime, metformin , ozempic . - Encouraged regular exercise and physical therapy. - vaccines UTD - Reminder to schedule eye exam.   Low back pain with radiculopathy Experiencing low back pain with radiculopathy, primarily affecting the left leg. Symptoms include numbness and tingling,  possibly related to arthritis. Differential includes peripheral vascular disease, but more consistent with radiculopathy. - Will review MRI results from pain clinic specialist. - Patient is high risk for PVD with diabetes and smoker. Will order ABIs.  - no EMGS of bilateral lower extremity have been done.   This NCV/EMG study shows the following: 1.   Moderate bilateral median neuropathies across the wrist, right worse than left. 2.   Mild right chronic C5 or C6 radiculopathy and possible left chronic C5 or C6 radiculopathy.   Pruritus of skin Persistent pruritus with some areas of skin irritation. Previous treatment with cream effective. - Refilled prescription for cream to manage pruritus.   Follow up in 3 months.   Jordie Schreur, PA-C

## 2024-03-26 NOTE — Patient Instructions (Signed)
 Screening for vascular disease with ABIs- will order.

## 2024-03-27 ENCOUNTER — Encounter: Payer: Self-pay | Admitting: Physician Assistant

## 2024-03-29 ENCOUNTER — Other Ambulatory Visit: Payer: Self-pay | Admitting: Physician Assistant

## 2024-03-29 DIAGNOSIS — N61 Mastitis without abscess: Secondary | ICD-10-CM

## 2024-03-29 DIAGNOSIS — M5451 Vertebrogenic low back pain: Secondary | ICD-10-CM | POA: Diagnosis not present

## 2024-03-29 DIAGNOSIS — M542 Cervicalgia: Secondary | ICD-10-CM | POA: Diagnosis not present

## 2024-03-29 DIAGNOSIS — M6281 Muscle weakness (generalized): Secondary | ICD-10-CM | POA: Diagnosis not present

## 2024-03-29 DIAGNOSIS — R293 Abnormal posture: Secondary | ICD-10-CM | POA: Diagnosis not present

## 2024-04-02 ENCOUNTER — Ambulatory Visit (HOSPITAL_COMMUNITY)

## 2024-04-03 ENCOUNTER — Ambulatory Visit (HOSPITAL_COMMUNITY)

## 2024-04-03 DIAGNOSIS — Z7984 Long term (current) use of oral hypoglycemic drugs: Secondary | ICD-10-CM | POA: Diagnosis not present

## 2024-04-03 DIAGNOSIS — N1831 Chronic kidney disease, stage 3a: Secondary | ICD-10-CM | POA: Diagnosis not present

## 2024-04-03 DIAGNOSIS — N183 Chronic kidney disease, stage 3 unspecified: Secondary | ICD-10-CM | POA: Diagnosis not present

## 2024-04-03 DIAGNOSIS — E1122 Type 2 diabetes mellitus with diabetic chronic kidney disease: Secondary | ICD-10-CM | POA: Diagnosis not present

## 2024-04-03 DIAGNOSIS — Z7985 Long-term (current) use of injectable non-insulin antidiabetic drugs: Secondary | ICD-10-CM | POA: Diagnosis not present

## 2024-04-03 DIAGNOSIS — Z794 Long term (current) use of insulin: Secondary | ICD-10-CM | POA: Diagnosis not present

## 2024-04-03 DIAGNOSIS — E785 Hyperlipidemia, unspecified: Secondary | ICD-10-CM | POA: Diagnosis not present

## 2024-04-03 DIAGNOSIS — I10 Essential (primary) hypertension: Secondary | ICD-10-CM | POA: Diagnosis not present

## 2024-04-03 DIAGNOSIS — Z79899 Other long term (current) drug therapy: Secondary | ICD-10-CM | POA: Diagnosis not present

## 2024-04-03 DIAGNOSIS — I129 Hypertensive chronic kidney disease with stage 1 through stage 4 chronic kidney disease, or unspecified chronic kidney disease: Secondary | ICD-10-CM | POA: Diagnosis not present

## 2024-04-04 ENCOUNTER — Other Ambulatory Visit: Payer: Self-pay | Admitting: Neurology

## 2024-04-04 NOTE — Telephone Encounter (Signed)
 [facility] called to request Pt  medication refill Folic Acid -Vit B6-Vit B12 (FOLBEE) 2.5-25-1 MG TABS tablet     Pt medication is to be sent to   [facility] [place] - [address] (Ph: [phone])

## 2024-04-05 DIAGNOSIS — M542 Cervicalgia: Secondary | ICD-10-CM | POA: Diagnosis not present

## 2024-04-05 DIAGNOSIS — R293 Abnormal posture: Secondary | ICD-10-CM | POA: Diagnosis not present

## 2024-04-05 DIAGNOSIS — M5451 Vertebrogenic low back pain: Secondary | ICD-10-CM | POA: Diagnosis not present

## 2024-04-05 DIAGNOSIS — M6281 Muscle weakness (generalized): Secondary | ICD-10-CM | POA: Diagnosis not present

## 2024-04-05 MED ORDER — FOLBEE 2.5-25-1 MG PO TABS: ORAL_TABLET | ORAL | 0 refills | Status: AC

## 2024-04-06 ENCOUNTER — Other Ambulatory Visit: Payer: Self-pay | Admitting: Neurology

## 2024-04-06 NOTE — Telephone Encounter (Signed)
 Rx was approved on 04/05/24

## 2024-04-13 ENCOUNTER — Ambulatory Visit (HOSPITAL_COMMUNITY)
Admission: RE | Admit: 2024-04-13 | Discharge: 2024-04-13 | Disposition: A | Discharge status: Home / Self Care | Source: Ambulatory Visit | Attending: Physician Assistant | Admitting: Physician Assistant

## 2024-04-13 DIAGNOSIS — M79605 Pain in left leg: Secondary | ICD-10-CM | POA: Diagnosis not present

## 2024-04-13 DIAGNOSIS — M79604 Pain in right leg: Secondary | ICD-10-CM | POA: Insufficient documentation

## 2024-04-14 ENCOUNTER — Other Ambulatory Visit: Payer: Self-pay | Admitting: Physician Assistant

## 2024-04-14 LAB — VAS US ABI WITH/WO TBI
Left ABI: 1.1
Right ABI: 1.22

## 2024-04-16 ENCOUNTER — Ambulatory Visit: Payer: Self-pay | Admitting: Physician Assistant

## 2024-04-16 ENCOUNTER — Encounter: Payer: Self-pay | Admitting: Physician Assistant

## 2024-04-16 DIAGNOSIS — M6281 Muscle weakness (generalized): Secondary | ICD-10-CM | POA: Diagnosis not present

## 2024-04-16 DIAGNOSIS — R293 Abnormal posture: Secondary | ICD-10-CM | POA: Diagnosis not present

## 2024-04-16 DIAGNOSIS — M542 Cervicalgia: Secondary | ICD-10-CM | POA: Diagnosis not present

## 2024-04-16 DIAGNOSIS — M5451 Vertebrogenic low back pain: Secondary | ICD-10-CM | POA: Diagnosis not present

## 2024-04-16 NOTE — Progress Notes (Signed)
 No evidence of peripheral vascular disease. GREAT news. ABI normal in both legs.

## 2024-04-23 ENCOUNTER — Other Ambulatory Visit: Payer: Self-pay | Admitting: Neurology

## 2024-04-23 ENCOUNTER — Other Ambulatory Visit: Payer: Self-pay | Admitting: Physician Assistant

## 2024-04-23 DIAGNOSIS — R293 Abnormal posture: Secondary | ICD-10-CM | POA: Diagnosis not present

## 2024-04-23 DIAGNOSIS — E6609 Other obesity due to excess calories: Secondary | ICD-10-CM

## 2024-04-23 DIAGNOSIS — M6281 Muscle weakness (generalized): Secondary | ICD-10-CM | POA: Diagnosis not present

## 2024-04-23 DIAGNOSIS — M542 Cervicalgia: Secondary | ICD-10-CM | POA: Diagnosis not present

## 2024-04-23 DIAGNOSIS — M5451 Vertebrogenic low back pain: Secondary | ICD-10-CM | POA: Diagnosis not present

## 2024-04-23 DIAGNOSIS — M5030 Other cervical disc degeneration, unspecified cervical region: Secondary | ICD-10-CM

## 2024-04-23 DIAGNOSIS — Z716 Tobacco abuse counseling: Secondary | ICD-10-CM

## 2024-05-06 ENCOUNTER — Other Ambulatory Visit: Payer: Self-pay | Admitting: Physician Assistant

## 2024-05-06 DIAGNOSIS — S21002D Unspecified open wound of left breast, subsequent encounter: Secondary | ICD-10-CM

## 2024-05-08 DIAGNOSIS — E6609 Other obesity due to excess calories: Secondary | ICD-10-CM

## 2024-05-08 DIAGNOSIS — N1832 Chronic kidney disease, stage 3b: Secondary | ICD-10-CM

## 2024-05-09 ENCOUNTER — Other Ambulatory Visit: Payer: Self-pay | Admitting: Neurology

## 2024-05-14 ENCOUNTER — Other Ambulatory Visit: Payer: Self-pay | Admitting: Neurology

## 2024-05-14 NOTE — Telephone Encounter (Signed)
 [facility] 2/34/25 Next OV not scheduled  Last refill - Discontinued by: Breeback, Jade L, PA-C on 12/23/2023 11:07

## 2024-05-28 ENCOUNTER — Ambulatory Visit: Payer: Self-pay

## 2024-05-28 NOTE — Telephone Encounter (Signed)
 FYI Only or Action Required?: FYI only for provider: appointment scheduled on 05/29/24.  Patient was last seen in primary care on 03/26/2024 by Antoniette Vermell CROME, PA-C.  Called Nurse Triage reporting Hypertension.  Symptoms began today.  Interventions attempted: Nothing.  Symptoms are: stable.  Triage Disposition: See PCP Within 2 Weeks  Patient/caregiver understands and will follow disposition?: Yes  Copied from CRM [id number]. Topic: Clinical - Red Word Triage >> May 28, 2024 10:07 AM Richerd B wrote: [facility] that prompted transfer to Nurse Triage: patient's left eye is blood shot Reason for Disposition  [1] Systolic BP >= 130 OR Diastolic >= 80 AND [2] taking BP medications  Answer Assessment - Initial Assessment Questions Patient reports waking up this morning with a blood shot red left eye. She says the inner corner is red. She says her BP has been running high, earlier before calling the machine gave 136/75, P 84; while on the phone with BP 149/83, P 85. She denies any associated symptoms (CP, SOB, dizziness, blurred vision, headache), but says she's worn out and feels like going back to bed, says she's felt this way all weekend. I asked about her medications. She says she take losartan  25 mg, hydrochlorothiazide  25 mg as well as losartan  75 mg prescribed by her nephrologist. Advised the only one listed on current med list that I see is losartan  25 mg. Advised to come in to see PCP and bring all medication bottles so that this can be addressed and reviewed by provider. She agreed to appointment tomorrow.   1. BLOOD PRESSURE: What is your blood pressure? Did you take at least two measurements 5 minutes apart?     149/83  2. ONSET: When did you take your blood pressure?     Just now  3. HOW: How did you take your blood pressure? (e.g., automatic home BP monitor, visiting nurse)     Automatic home monitor  4. HISTORY: Do you have a history of high blood pressure?      Yes  5. MEDICINES: Are you taking any medicines for blood pressure? Have you missed any doses recently?     Yes, losartan  and hydrochlorothiazide , may have missed a dose last night losartan   6. OTHER SYMPTOMS: Do you have any symptoms? (e.g., blurred vision, chest pain, difficulty breathing, headache, weakness)     Feeling tired, left eye blood shot  Protocols used: Blood Pressure - High-A-AH

## 2024-05-29 ENCOUNTER — Encounter: Payer: Self-pay | Admitting: Physician Assistant

## 2024-05-29 ENCOUNTER — Ambulatory Visit: Admitting: Physician Assistant

## 2024-05-29 VITALS — BP 137/76 | HR 96 | Ht 67.0 in | Wt 211.0 lb

## 2024-05-29 DIAGNOSIS — G44201 Tension-type headache, unspecified, intractable: Secondary | ICD-10-CM | POA: Diagnosis not present

## 2024-05-29 DIAGNOSIS — L732 Hidradenitis suppurativa: Secondary | ICD-10-CM

## 2024-05-29 DIAGNOSIS — H1132 Conjunctival hemorrhage, left eye: Secondary | ICD-10-CM | POA: Diagnosis not present

## 2024-05-29 DIAGNOSIS — I10 Essential (primary) hypertension: Secondary | ICD-10-CM | POA: Diagnosis not present

## 2024-05-29 DIAGNOSIS — J0140 Acute pansinusitis, unspecified: Secondary | ICD-10-CM

## 2024-05-29 MED ORDER — SPIRONOLACTONE 25 MG PO TABS: 25.0000 mg | ORAL_TABLET | Freq: Every day | ORAL | 1 refills | Status: AC

## 2024-05-29 MED ORDER — AZITHROMYCIN 250 MG PO TABS: ORAL_TABLET | ORAL | 0 refills | Status: DC

## 2024-05-29 NOTE — Patient Instructions (Addendum)
 If BP above 150/90 then can take another losartan  25mg .   Start zpak for sinusitis.

## 2024-05-29 NOTE — Progress Notes (Unsigned)
" ° °  Established Patient Office Visit  Subjective   Patient ID: Nina Trujillo, female    DOB: 05-10-1966  Age: 59 y.o. MRN: [id number]  Chief Complaint  Patient presents with   Medical Management of Chronic Issues    HPI  {History (Optional):23778}  ROS    Objective:     BP 137/76   Pulse 96   Ht 5' 7 (1.702 m)   Wt 211 lb (95.7 kg)   SpO2 99%   BMI 33.05 kg/m  {Vitals History (Optional):23777}  Physical Exam   No results found for any visits on 05/29/24.  {Labs (Optional):23779}  The ASCVD Risk score (Arnett DK, et al., 2019) failed to calculate for the following reasons:   Risk score cannot be calculated because patient has a medical history suggesting prior/existing ASCVD   * - Cholesterol units were assumed    Assessment & Plan:   Problem List Items Addressed This Visit       Unprioritized   Essential hypertension, benign - Primary    No follow-ups on file.    Larence Thone, PA-C  "

## 2024-05-30 ENCOUNTER — Ambulatory Visit: Payer: Self-pay | Admitting: Physician Assistant

## 2024-05-30 DIAGNOSIS — J0140 Acute pansinusitis, unspecified: Secondary | ICD-10-CM | POA: Insufficient documentation

## 2024-05-30 DIAGNOSIS — H1132 Conjunctival hemorrhage, left eye: Secondary | ICD-10-CM | POA: Insufficient documentation

## 2024-05-30 DIAGNOSIS — G44201 Tension-type headache, unspecified, intractable: Secondary | ICD-10-CM | POA: Insufficient documentation

## 2024-05-30 LAB — CBC WITH DIFFERENTIAL/PLATELET
Basophils Absolute: 0.1 x10E3/uL (ref 0.0–0.2)
Basos: 1 %
EOS (ABSOLUTE): 0.2 x10E3/uL (ref 0.0–0.4)
Eos: 2 %
Hematocrit: 43.7 % (ref 34.0–46.6)
Hemoglobin: 14.4 g/dL (ref 11.1–15.9)
Immature Grans (Abs): 0 x10E3/uL (ref 0.0–0.1)
Immature Granulocytes: 0 %
Lymphocytes Absolute: 3.5 x10E3/uL — ABNORMAL HIGH (ref 0.7–3.1)
Lymphs: 31 %
MCH: 31.2 pg (ref 26.6–33.0)
MCHC: 33 g/dL (ref 31.5–35.7)
MCV: 95 fL (ref 79–97)
Monocytes Absolute: 0.5 x10E3/uL (ref 0.1–0.9)
Monocytes: 4 %
Neutrophils Absolute: 6.9 x10E3/uL (ref 1.4–7.0)
Neutrophils: 62 %
Platelets: 297 x10E3/uL (ref 150–450)
RBC: 4.62 x10E6/uL (ref 3.77–5.28)
RDW: 15.7 % — ABNORMAL HIGH (ref 11.7–15.4)
WBC: 11.2 x10E3/uL — ABNORMAL HIGH (ref 3.4–10.8)

## 2024-05-30 LAB — CMP14+EGFR
ALT: 18 IU/L (ref 0–32)
AST: 24 IU/L (ref 0–40)
Albumin: 4.2 g/dL (ref 3.8–4.9)
Alkaline Phosphatase: 93 IU/L (ref 49–135)
BUN/Creatinine Ratio: 11 — ABNORMAL LOW (ref 12–28)
BUN: 15 mg/dL (ref 8–27)
Bilirubin Total: <0.2 mg/dL (ref 0.0–1.2)
CO2: 21 mmol/L (ref 20–29)
Calcium: 10 mg/dL (ref 8.7–10.3)
Chloride: 104 mmol/L (ref 96–106)
Creatinine, Ser: 1.35 mg/dL — ABNORMAL HIGH (ref 0.57–1.00)
Globulin, Total: 2.6 g/dL (ref 1.5–4.5)
Glucose: 71 mg/dL (ref 70–99)
Potassium: 4.6 mmol/L (ref 3.5–5.2)
Sodium: 140 mmol/L (ref 134–144)
Total Protein: 6.8 g/dL (ref 6.0–8.5)
eGFR: 45 mL/min/1.73 — ABNORMAL LOW

## 2024-05-30 NOTE — Progress Notes (Signed)
 Nina Trujillo,   Kidney function stable.  Liver enzymes look good.  WBC up some which can be indication of infection. The zpak should treat this.  Let me know how you are feeling in a few days.

## 2024-06-20 ENCOUNTER — Encounter: Payer: Self-pay | Admitting: Physician Assistant

## 2024-06-20 MED ORDER — DOXYCYCLINE HYCLATE 100 MG PO TABS: 100.0000 mg | ORAL_TABLET | Freq: Two times a day (BID) | ORAL | 0 refills | Status: AC

## 2024-06-20 NOTE — Telephone Encounter (Signed)
 Patient requesting rx rf of doxycycline  for skin problems  Last filled by Dr. Curtis [id number] Last OV 05/29/2024 Upcoming appt 06/26/2024

## 2024-06-26 ENCOUNTER — Ambulatory Visit: Admitting: Physician Assistant
